# Patient Record
Sex: Female | Born: 1990 | Race: White | Hispanic: No
Health system: Western US, Academic
[De-identification: ages and names within clinical notes are randomized; demographics above are authoritative.]

## PROBLEM LIST (undated history)

## (undated) DIAGNOSIS — R87619 Unspecified abnormal cytological findings in specimens from cervix uteri: Secondary | ICD-10-CM

## (undated) DIAGNOSIS — N939 Abnormal uterine and vaginal bleeding, unspecified: Secondary | ICD-10-CM

## (undated) DIAGNOSIS — Z789 Other specified health status: Secondary | ICD-10-CM

## (undated) DIAGNOSIS — K219 Gastro-esophageal reflux disease without esophagitis: Secondary | ICD-10-CM

## (undated) DIAGNOSIS — E669 Obesity, unspecified: Secondary | ICD-10-CM

## (undated) DIAGNOSIS — R12 Heartburn: Secondary | ICD-10-CM

## (undated) DIAGNOSIS — K259 Gastric ulcer, unspecified as acute or chronic, without hemorrhage or perforation: Secondary | ICD-10-CM

## (undated) DIAGNOSIS — F32A Depression, unspecified: Secondary | ICD-10-CM

## (undated) DIAGNOSIS — R011 Cardiac murmur, unspecified: Secondary | ICD-10-CM

## (undated) DIAGNOSIS — R079 Chest pain, unspecified: Secondary | ICD-10-CM

## (undated) DIAGNOSIS — K30 Functional dyspepsia: Secondary | ICD-10-CM

## (undated) DIAGNOSIS — R112 Nausea with vomiting, unspecified: Secondary | ICD-10-CM

## (undated) DIAGNOSIS — Z8719 Personal history of other diseases of the digestive system: Secondary | ICD-10-CM

## (undated) DIAGNOSIS — F419 Anxiety disorder, unspecified: Secondary | ICD-10-CM

## (undated) DIAGNOSIS — R519 Headache, unspecified: Secondary | ICD-10-CM

## (undated) HISTORY — DX: Gastro-esophageal reflux disease without esophagitis: K21.9

## (undated) HISTORY — DX: Headache, unspecified: R51.9

## (undated) HISTORY — PX: ESOPHAGOGASTRODUODENOSCOPY: SHX5003

## (undated) HISTORY — DX: Unspecified abnormal cytological findings in specimens from cervix uteri: R87.619

## (undated) HISTORY — DX: Depression, unspecified: F32.A

## (undated) HISTORY — DX: Abnormal uterine and vaginal bleeding, unspecified: N93.9

## (undated) HISTORY — PX: PR COLONOSCOPY STOMA DX INCLUDING COLLJ SPEC SPX: 44388

## (undated) HISTORY — DX: Functional dyspepsia: K30

## (undated) HISTORY — DX: Anxiety disorder, unspecified: F41.9

## (undated) HISTORY — DX: Personal history of other diseases of the digestive system: Z87.19

## (undated) HISTORY — PX: CARPAL TUNNEL RELEASE: SHX5017

## (undated) HISTORY — DX: Cardiac murmur, unspecified: R01.1

## (undated) HISTORY — DX: Heartburn: R12

## (undated) HISTORY — DX: Other specified health status: Z78.9

## (undated) HISTORY — DX: Chest pain, unspecified: R07.9

## (undated) HISTORY — DX: Nausea with vomiting, unspecified: R11.2

## (undated) HISTORY — DX: Gastric ulcer, unspecified as acute or chronic, without hemorrhage or perforation: K25.9

## (undated) HISTORY — DX: Obesity, unspecified: E66.9

## (undated) DEATH — deceased

---
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
## 2013-02-05 ENCOUNTER — Telehealth (HOSPITAL_BASED_OUTPATIENT_CLINIC_OR_DEPARTMENT_OTHER): Payer: Self-pay

## 2013-02-05 NOTE — Telephone Encounter (Signed)
CONFIRMED PHONE NUMBER: [phone]    CALLERS FIRST AND LAST NAME: [name],  FACILITY NAME: N/A TITLE: N/A  CALLERS RELATIONSHIP:Self  RETURN CALL: OK to leave detailed message with anyone that answers     SUBJECT: General Message   REASON FOR REQUEST: Patient has urgent referral- called to schedule.  Please contact patient for appointment    MESSAGE: Please assist

## 2013-02-08 NOTE — Telephone Encounter (Signed)
Left a message for pt to call my direct line at [phone] to schedule her appt.

## 2013-02-09 ENCOUNTER — Telehealth (HOSPITAL_BASED_OUTPATIENT_CLINIC_OR_DEPARTMENT_OTHER): Payer: Self-pay | Admitting: Critical Care Medicine

## 2013-02-09 NOTE — Telephone Encounter (Signed)
CONFIRMED PHONE NUMBER: [phone]  CALLERS FIRST AND LAST NAME: Kristen Salinas    FACILITY NAME: NA TITLE: NA  CALLERS RELATIONSHIP:Self  RETURN CALL: Detailed message on voicemail only     SUBJECT: Appointment Request   REASON FOR REQUEST: Patient has a referral and was called 02/08/2013 to be scheduled and missed the call. Was advised to call back before 5pm but she was too late. No scheduling instructions are in the referral for SHORTNESS OF BREATH X 2 DAYS, AMBULATORY O2 DECREASED TO 90%, CT  PULMONARY AGIOGRAM SHOWS GROUND GLASS CHANGES.    REQUEST APPOINTMENT WITH: Unknown per referral  REFERRING PROVIDER: Braulio Bosch MERRELL    REQUESTED DATE: As soon as possible   REQUESTED TIME: any  UNABLE TO APPOINT: Other: No scheduling notes

## 2013-02-12 NOTE — Telephone Encounter (Signed)
Patient calling back. Has not heard back from anyone since missed call. 7/11. Patient currently experiencing shortness of breath again, ongoing for the past 3 days. Patient declined consulting nurse and 911. Please call patient to appoint, no scheduling instructions in referral   [phone], ok to leave a detailed message.  Thank you.

## 2013-02-19 ENCOUNTER — Ambulatory Visit (HOSPITAL_BASED_OUTPATIENT_CLINIC_OR_DEPARTMENT_OTHER): Payer: No Typology Code available for payment source

## 2013-02-19 ENCOUNTER — Encounter (HOSPITAL_BASED_OUTPATIENT_CLINIC_OR_DEPARTMENT_OTHER): Payer: Self-pay

## 2013-02-19 VITALS — BP 123/61 | HR 57 | Temp 97.2°F | Resp 17 | Ht 63.5 in | Wt 219.6 lb

## 2013-02-19 DIAGNOSIS — F4310 Post-traumatic stress disorder, unspecified: Secondary | ICD-10-CM | POA: Insufficient documentation

## 2013-02-19 DIAGNOSIS — R0609 Other forms of dyspnea: Secondary | ICD-10-CM | POA: Insufficient documentation

## 2013-02-19 DIAGNOSIS — R0600 Dyspnea, unspecified: Secondary | ICD-10-CM

## 2013-02-19 DIAGNOSIS — K449 Diaphragmatic hernia without obstruction or gangrene: Secondary | ICD-10-CM | POA: Insufficient documentation

## 2013-02-19 DIAGNOSIS — R079 Chest pain, unspecified: Secondary | ICD-10-CM | POA: Insufficient documentation

## 2013-02-19 DIAGNOSIS — F32A Depression, unspecified: Secondary | ICD-10-CM | POA: Insufficient documentation
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
## 2013-02-19 NOTE — Patient Instructions (Signed)
Decrease propranolol to 20 mg daily.

## 2013-03-25 ENCOUNTER — Ambulatory Visit (HOSPITAL_BASED_OUTPATIENT_CLINIC_OR_DEPARTMENT_OTHER): Payer: No Typology Code available for payment source

## 2013-03-26 ENCOUNTER — Encounter (HOSPITAL_BASED_OUTPATIENT_CLINIC_OR_DEPARTMENT_OTHER): Payer: No Typology Code available for payment source

## 2014-09-10 ENCOUNTER — Ambulatory Visit (INDEPENDENT_AMBULATORY_CARE_PROVIDER_SITE_OTHER): Payer: No Typology Code available for payment source | Admitting: Family Medicine

## 2014-09-10 ENCOUNTER — Encounter (INDEPENDENT_AMBULATORY_CARE_PROVIDER_SITE_OTHER): Payer: Self-pay | Admitting: Family Medicine

## 2014-09-10 VITALS — BP 117/78 | HR 89 | Temp 99.5°F | Resp 16 | Ht 63.5 in | Wt 238.0 lb

## 2014-09-10 DIAGNOSIS — Z1329 Encounter for screening for other suspected endocrine disorder: Secondary | ICD-10-CM

## 2014-09-10 DIAGNOSIS — Z308 Encounter for other contraceptive management: Secondary | ICD-10-CM

## 2014-09-10 DIAGNOSIS — Z3042 Encounter for surveillance of injectable contraceptive: Secondary | ICD-10-CM | POA: Insufficient documentation

## 2014-09-10 DIAGNOSIS — R87619 Unspecified abnormal cytological findings in specimens from cervix uteri: Secondary | ICD-10-CM

## 2014-09-10 DIAGNOSIS — F5101 Primary insomnia: Secondary | ICD-10-CM

## 2014-09-10 DIAGNOSIS — Z131 Encounter for screening for diabetes mellitus: Secondary | ICD-10-CM

## 2014-09-10 DIAGNOSIS — N761 Subacute and chronic vaginitis: Secondary | ICD-10-CM | POA: Insufficient documentation

## 2014-09-10 DIAGNOSIS — Z13220 Encounter for screening for lipoid disorders: Secondary | ICD-10-CM

## 2014-09-10 DIAGNOSIS — Z0000 Encounter for general adult medical examination without abnormal findings: Secondary | ICD-10-CM

## 2014-09-10 LAB — LIPID PANEL
Cholesterol (LDL): 100 mg/dL (ref <130)
Cholesterol/HDL Ratio: 3.1
HDL Cholesterol: 51 mg/dL (ref >39)
Non-HDL Cholesterol: 108 mg/dL (ref 0–159)
Total Cholesterol: 159 mg/dL (ref <200)
Triglyceride: 42 mg/dL (ref <150)

## 2014-09-10 LAB — GLUCOSE SERUM, NONFASTING: Glucose: 89 mg/dL (ref 62–125)

## 2014-09-10 LAB — THYROID STIMULATING HORMONE: Thyroid Stimulating Hormone: 2.295 u[IU]/mL (ref 0.400–5.000)

## 2014-09-10 MED ORDER — ZOLPIDEM TARTRATE 5 MG OR TABS
5.0000 mg | ORAL_TABLET | Freq: Every evening | ORAL | Status: DC | PRN
Start: 2014-09-10 — End: 2014-09-11

## 2014-09-10 NOTE — Progress Notes (Signed)
Reason for visit: Physical    Reviewed eCare status with Patient:  NO    HEALTH MAINTENANCE:  Has the patient had any of these since their last visit?    Cervical screening/PAP: [facility] on 08/27/2014 (Care Everywhere)     Mammo: N/A    Colon Screen: N/A    Have you seen a specialist since your last visit: No        HM Due:   Health Maintenance   Topic Date Due   . HPV 9-26 YEARS (1 of 3 - Female/Unknown 3 Dose Series) 08/19/2003   . TETANUS BOOSTER  08/18/2004   . HIV SCREENING  08/19/2007   . CHLAMYDIA SCREEN  08/18/2010   . PAP SMEAR 21-64 YEARS  08/18/2013   . INFLUENZA VACCINE (1) 04/01/2014           Future Appointments  Date Time Provider Department Center   09/10/2014 12:00 PM Diddee, [facility] JamesSeema, [facility]

## 2014-09-10 NOTE — Patient Instructions (Signed)
It was a pleasure to see you in clinic today.                  If you are not yet signed up for eCare, please speak with a team member at the front desk who would happy to assist you with this process.      eCare  enrollment will allow you access to the below benefits   You can make appointments online   View test results / Lab Results   Obtain a copy of our After Visit Summary (a summary of your visit today)     Your Test Results:  If labs were ordered today the results are expected to be available via eCare in about 5 days. If you have an active eCare account, this is how we will notify you of your results.     If you do not have an eCare account then your test results will be mailed to you within about 14 days after your tests are completed. If your physician needs to change your care based on your results or is concerned, you will receive a phone call.     If you have any questions about your test results please schedule an appointment with your provider.    **If it has been more than 2 weeks and you have not received your test results please send our office a message via eCare.    Medication Refills: If you need a prescription refilled, please contact your pharmacy 1 week before your current supply will run out to request the refill.  Contacting your pharmacy is the fastest and safest way to obtain a medication refill.  The pharmacy will notify our office.  Please note, that a minimum of 48 to 72 hours is needed to refill a medication, but refills are usually processed and sent to your pharmacy in about 5 business days.  Please call your pharmacy early to allow enough time to refill before you anticipate running out.  For faster medication refills, you can also schedule an appointment with your provider.    We know you have a choice in where you receive your healthcare and we sincerely thank you for trusting [facility] with your health.

## 2014-09-11 ENCOUNTER — Ambulatory Visit (INDEPENDENT_AMBULATORY_CARE_PROVIDER_SITE_OTHER): Payer: No Typology Code available for payment source | Admitting: Family Medicine

## 2014-09-11 ENCOUNTER — Telehealth (INDEPENDENT_AMBULATORY_CARE_PROVIDER_SITE_OTHER): Payer: Self-pay | Admitting: Family Medicine

## 2014-09-11 VITALS — BP 121/75 | HR 77 | Temp 99.6°F | Wt 237.0 lb

## 2014-09-11 DIAGNOSIS — G4709 Other insomnia: Secondary | ICD-10-CM

## 2014-09-11 DIAGNOSIS — N760 Acute vaginitis: Secondary | ICD-10-CM

## 2014-09-11 MED ORDER — ZOLPIDEM TARTRATE ER 12.5 MG OR TBCR
12.5000 mg | EXTENDED_RELEASE_TABLET | Freq: Every evening | ORAL | Status: DC | PRN
Start: 2014-09-11 — End: 2014-09-15

## 2014-09-11 MED ORDER — FLUCONAZOLE 150 MG OR TABS
150.0000 mg | ORAL_TABLET | Freq: Once | ORAL | Status: AC
Start: 2014-09-11 — End: 2014-09-11

## 2014-09-11 NOTE — Telephone Encounter (Signed)
Dr. Irine Sealiddee,     Please see below, Pt is having trouble staying asleep with the Ambien and was wondering if you could change it to the Ambien CR.     Please advise. Thank you.

## 2014-09-11 NOTE — Telephone Encounter (Signed)
Faxed in Ambien CR   has she tried BEnadryl or Unisom?

## 2014-09-11 NOTE — Telephone Encounter (Signed)
Please let patient know that insurance will likely not cover this.    I will print this out when I am in clinic on Saturday

## 2014-09-11 NOTE — Telephone Encounter (Signed)
Called and notified Pt per below.     She would like to know if you are willing to send in the Ambien CR to see what her insurance says, if it will be covered.     If the insurance does not cover that and the Ambien, she would like to know her other options would be instead of the regular Ambien.     She asks that I send the message to Dr. Irine Sealiddee and the Covering Provider. She adds that she did not sleep at all last night and is frustrated that Dr. Irine Sealiddee did not have further advice.

## 2014-09-11 NOTE — Progress Notes (Signed)
Subjective:  Kristen Salinas is a 24 year old Female here with chief complaint of 1)had Nexplanon but had prolonged bleeding; also had cramps 1 week before bleeding; had removed on 08/27/14; now has had the Depo Provera; had a pap on 08/29/14 and was given Diflucan but did not take it;had BV show up on pap smear so was given a week of Flagyl; has had yeasty discharge since Tuesday and she took Diflucan    No past medical history on file.  No past surgical history on file.  Patient Active Problem List   Diagnosis   . Depression   . Hiatal hernia   . PTSD (post-traumatic stress disorder)   . Subacute vaginitis   . Encounter for surveillance of injectable contraceptive   . Primary insomnia     Current Outpatient Prescriptions   Medication Sig Dispense Refill   . TRAZODONE HCL OR 200mg  QHS     . Zolpidem Tartrate ER (AMBIEN CR) 12.5 MG Oral Tab CR Take 1 tablet (12.5 mg) by mouth at bedtime as needed for sleep. 30 tablet 0     No current facility-administered medications for this visit.     History     Social History   . Marital Status: Single     Spouse Name: N/A     Number of Children: N/A   . Years of Education: N/A     Occupational History   . Not on file.     Social History Main Topics   . Smoking status: Former Smoker -- 0.50 packs/day for 2 years     Types: Cigarettes   . Smokeless tobacco: Never Used   . Alcohol Use: Yes      Comment: Rare   . Drug Use: No   . Sexual Activity: No     Other Topics Concern   . Not on file     Social History Narrative     Allergies-Prednisone  ROS: Review of Systems:  Constitutional: Negative     Genitourinary: As noted in HPI above   OBJECTIVE:     BP 121/75 mmHg  Pulse 77  Temp(Src) 99.6 F (37.6 C) (Temporal)  Wt 237 lb (107.502 kg)  SpO2 97%  LMP 08/27/2014  General appearance*: Color is normal, Weight appears normal for height.  and No acute distress  Affect*: Judgement/insight normal, Mood/affect normal, Orientation oriented to time, person and place and  Recent/remote Memory normal  Pelvic: Ext. gen.- no lesions;urethral meatus- supravaginal,normal size,no prolapse noted;urethra- no tenderness or scarring noted;vagina-normal rugae with white discharge noted;cervix-long,closed  Wet mount-1+ yeast    IMPRESSION:  (N76.0) Acute vaginitis  (primary encounter diagnosis)  Plan:1) WET PREP/KOH/TEST, ONSITE done  2)  Fluconazole (DIFLUCAN) 150 MG Oral Tab  Given  Return to clinic as needed for followup

## 2014-09-11 NOTE — Telephone Encounter (Signed)
CONFIRMED PHONE NUMBER: [phone]  CALLERS FIRST AND LAST NAME: Janae BridgemanJessica Nicole Mulka  FACILITY NAME: NA TITLE: NA  CALLERS RELATIONSHIP:Self  RETURN CALL: Detailed message on voicemail only     SUBJECT: Medication Management   REASON FOR REQUEST: Medication Management    MEDICATION(S): Ambien   MEDICATION(S)  NEEDED BY: ASAP  ADDITIONAL INFORMATION: Patient was prescribed the Ambien and is having difficulties staying asleep.  She would like to know if Dr. Irine Sealiddee can prescribe the Ambien CR.     Please assist, thank you!

## 2014-09-11 NOTE — Patient Instructions (Addendum)
It was a pleasure to see you in clinic today.                  If you are not yet signed up for eCare, please speak with a team member at the front desk who would happy to assist you with this process.      eCare  enrollment will allow you access to the below benefits   You can make appointments online   View test results / Lab Results   Obtain a copy of our After Visit Summary (a summary of your visit today)     Your Test Results:  If labs were ordered today the results are expected to be available via eCare in about 5 days. If you have an active eCare account, this is how we will notify you of your results.     If you do not have an eCare account then your test results will be mailed to you within about 14 days after your tests are completed. If your physician needs to change your care based on your results or is concerned, you will receive a phone call.     If you have any questions about your test results please schedule an appointment with your provider.    **If it has been more than 2 weeks and you have not received your test results please send our office a message via eCare.    Medication Refills: If you need a prescription refilled, please contact your pharmacy 1 week before your current supply will run out to request the refill.  Contacting your pharmacy is the fastest and safest way to obtain a medication refill.  The pharmacy will notify our office.  Please note, that a minimum of 48 to 72 hours is needed to refill a medication, but refills are usually processed and sent to your pharmacy in about 5 business days.  Please call your pharmacy early to allow enough time to refill before you anticipate running out.  For faster medication refills, you can also schedule an appointment with your provider.    We know you have a choice in where you receive your healthcare and we sincerely thank you for trusting [facility] with your health.    Vaginal Infection: Yeast (Candidiasis)  Yeast  infection occurs when yeast in the vagina increase and start attacking the vaginal tissues. Yeast is a type of fungus. These infections are often caused by a type of yeast called Candida albicans. Other species of yeast can also cause infections. Factors that may make infection more likely include recent antibiotic use, douching, or increasedsex. Yeast infections are more common in women who are diabetic, obese, pregnant, or have a weak immune system.  Symptoms of yeast infection   Clumpy or thin, white discharge, which may look like cottage cheese   No odor or minimal odor   Severe vaginal itching or burning   Burning with urination   Swelling, redness of vulva   Pain during sex  Treating yeast infection  Yeast infection is treated with a vaginal antifungal cream. In some cases, antifungal pills are prescribed instead. During treatment:   Finish all of your medication, even if your symptoms go away.   Apply the cream before going to bed. Lie flat after applying so that it doesn't drip out.   Do not douche or use tampons.   Don't rely on a diaphragm or condoms, since the cream may weaken them.   Avoid intercourse if   advised by your health care provider.       2000-2015 [employer] [address], [place] [address]. All rights reserved. This information is not intended as a substitute for professional medical care. Always follow your healthcare professional's instructions.

## 2014-09-11 NOTE — Progress Notes (Signed)
Reason for visit: recheck vaginal infection   Reviewed eCare status with Patient:  YES    HEALTH MAINTENANCE:  Has the patient had any of these since their last visit?    Cervical screening/PAP: N/A     Mammo: N/A    Colon Screen: N/A    Have you seen a specialist since your last visit: No        HM Due: no hm due  Health Maintenance   Topic Date Due   . HIV SCREENING  08/19/2007   . CHLAMYDIA SCREEN  08/18/2010   . PAP 1 YEAR W/IB MESSAGE  08/28/2015   . TETANUS BOOSTER  12/26/2022   . INFLUENZA VACCINE  Completed   . HPV 9-26 YEARS  Completed           Future Appointments  Date Time Provider Department Center   09/11/2014 7:00 PM Read-Williams, Colvin CaroliPatricia Gale, [facility]   10/13/2014 11:00 AM Mandell, Remo Lippsandy Brian, MD [facility]

## 2014-09-12 ENCOUNTER — Encounter (INDEPENDENT_AMBULATORY_CARE_PROVIDER_SITE_OTHER): Payer: Self-pay | Admitting: Family Medicine

## 2014-09-12 ENCOUNTER — Telehealth (INDEPENDENT_AMBULATORY_CARE_PROVIDER_SITE_OTHER): Payer: Self-pay | Admitting: Internal Medicine

## 2014-09-12 ENCOUNTER — Telehealth (INDEPENDENT_AMBULATORY_CARE_PROVIDER_SITE_OTHER): Payer: Self-pay | Admitting: Family Medicine

## 2014-09-12 DIAGNOSIS — G4700 Insomnia, unspecified: Secondary | ICD-10-CM

## 2014-09-12 LAB — PR WET PREP/KOH/TEST, ONSITE
Clue Cells: NEGATIVE
Odor: ABSENT
RBC, Wet Prep: NEGATIVE
Trich: NEGATIVE

## 2014-09-12 MED ORDER — ESZOPICLONE 1 MG OR TABS
1.0000 mg | ORAL_TABLET | Freq: Every evening | ORAL | Status: DC | PRN
Start: 2014-09-12 — End: 2014-09-15

## 2014-09-12 NOTE — Telephone Encounter (Signed)
Routing to provider. Please see the other TE regarding her request for Lunesta.

## 2014-09-12 NOTE — Telephone Encounter (Signed)
Will defer to Dr. Irine Sealiddee about Kristen Salinas, which is a controlled substance.

## 2014-09-12 NOTE — Telephone Encounter (Signed)
Patient walked in to the clinic and asked for Lunesta.  1 tablet is given to patient.  She is told to wait for Dr. Irine Sealiddee for medication management tomorrow. Please see order.

## 2014-09-12 NOTE — Telephone Encounter (Signed)
Multiple encounters addressing same request and patient complaint. Please contact patient as soon as possible to advise, she is very frustrated. Thank you!

## 2014-09-12 NOTE — Telephone Encounter (Signed)
Patient states that pharmacy dispensed Ambien and patient states that insurance does not cover Ambien.  She [place] [facility] [address].  She can be reached at [phone].  Thank you.

## 2014-09-12 NOTE — Telephone Encounter (Signed)
Patient would like to try Lunesta since her insurance will cover this. Patient's insurance will not cover her Ambien. [facility]? Please also see TE from 09/11/2014.

## 2014-09-12 NOTE — Telephone Encounter (Signed)
This is being handled via the other TE. Closing TE

## 2014-09-12 NOTE — Telephone Encounter (Signed)
Patient was advised earlier that her PCP is not at the clinic today.     Routing again to covering provider to see if they can send in [place].

## 2014-09-12 NOTE — Telephone Encounter (Signed)
Patient calling to check on the status of her prescription refill.  Please contact. Thank you.

## 2014-09-12 NOTE — Telephone Encounter (Signed)
Patient came to the clinic and insist to have Zamb[place]unesta script. 1 tab is given. Needs follow up with Dr. Irine Sealiddee for medication management.

## 2014-09-12 NOTE — Telephone Encounter (Signed)
This request has already been routed to provider and covering provider.

## 2014-09-12 NOTE — Telephone Encounter (Signed)
CONFIRMED PHONE NUMBER: [phone]  CALLERS FIRST AND LAST NAME: Janae BridgemanJessica Nicole Camper  FACILITY NAME: na TITLE: na  CALLERS RELATIONSHIP:Self  RETURN CALL: General message OK     SUBJECT: General Message   REASON FOR REQUEST: Patient is calling with a list of sleeping medication approved by [facility]. Patient also stated she would like to hear back today, she has not slept for 48 hours. Please Advise. Thank you      List:   Restila  Halcion  Hezzie BumpUnison  Lynesta - patient prefers   Sonata        MESSAGE: see above.

## 2014-09-13 ENCOUNTER — Encounter (INDEPENDENT_AMBULATORY_CARE_PROVIDER_SITE_OTHER): Payer: No Typology Code available for payment source | Admitting: Physician Assistant

## 2014-09-13 ENCOUNTER — Telehealth (INDEPENDENT_AMBULATORY_CARE_PROVIDER_SITE_OTHER): Payer: Self-pay | Admitting: Family Medicine

## 2014-09-13 ENCOUNTER — Encounter (INDEPENDENT_AMBULATORY_CARE_PROVIDER_SITE_OTHER): Payer: Self-pay | Admitting: Family Medicine

## 2014-09-13 ENCOUNTER — Ambulatory Visit: Payer: Self-pay

## 2014-09-13 NOTE — Telephone Encounter (Signed)
Protocol: NO CONTACT OR DUPLICATE CONTACT CALL-ADULT-AH  Affirmative: Message left on identified answering machine.  Disposition of No Contact Calls  suggested.  [facility] asking if we would F/U with a call the CC took in regards to [facility] called pt, no answer, LMTCB if still requiring assistance    Glennie Hawkugustine, Reannah Lynn at 09/13/2014 11:30 AM      Status: Signed        Expand All Collapse All   CONFIRMED PHONE NUMBER: [phone]  CALLERS FIRST AND LAST NAME: Janae BridgemanJessica Nicole Dolph  FACILITY NAME: na TITLE: na  CALLERS RELATIONSHIP:Self  RETURN CALL: Detailed message on voicemail only  SUBJECT: General Message   REASON FOR REQUEST: Medication     MESSAGE: Pt wanted to inform Seema Didde that she took the Eszopiclone (LUNESTA) 1 MG Oral Tab and woke up with restless legs and itchy body. Pt stated that she is going to go back to taking the Trazadone until she see's Seema Didde on next appointment on 09/15/14. CCR offered for [facility] to [facility] in regards to the side effects of medication but pt declined. Thank you!

## 2014-09-13 NOTE — Telephone Encounter (Signed)
CONFIRMED PHONE NUMBER: [phone]  CALLERS FIRST AND LAST NAME: Janae BridgemanJessica Nicole Kruger  FACILITY NAME: na TITLE: na  CALLERS RELATIONSHIP:Self  RETURN CALL: Detailed message on voicemail only     SUBJECT: General Message   REASON FOR REQUEST: Medication     MESSAGE: Pt wanted to inform Seema Didde that she took the Eszopiclone (LUNESTA) 1 MG Oral Tab and woke up with restless legs and itchy body. Pt stated that she is going to go back to taking the Trazadone until she see's Seema Didde on next appointment on 09/15/14. CCR offered for pt to [facility] in regards to the side effects of medication but pt declined. Thank you!

## 2014-09-13 NOTE — Telephone Encounter (Signed)
PLEASE CALL AND SEE HOW PATIENT DID WITH THE LUNESTA BEFORE I CAN FILL MORE

## 2014-09-13 NOTE — Telephone Encounter (Signed)
Requested CCL to call and discuss possible medication reaction with the patient.  Please check status on Monday.

## 2014-09-14 DIAGNOSIS — R87619 Unspecified abnormal cytological findings in specimens from cervix uteri: Secondary | ICD-10-CM | POA: Insufficient documentation

## 2014-09-14 NOTE — Progress Notes (Signed)
Kristen BridgemanJessica Nicole Salinas is a 24 year old female here today for a preventive health visit.   Other problems or concerns today: would like something for insomnia  States that she has always had issues falling and staying asleep  OTC meds like benadryl and melatonin havent helped    She has [place] and would like that  She states that he depression and anxiety are well controlled and she has no concerns now  Had a paps few days back.one 6 months back was abnormal.    Had a nexaplanon taken out in 7 days after placement since is caused a lot of cramping  Started depot again  Has been on depot since she was 15    GYN HISTORY  Obstetric History     No data available     Patient's last menstrual period was 08/20/2014.   SEXUAL HISTORY  Sexual activity: yes, single partner, contraception - Depo Provera  Age at first intercourse: Not asked today  History of STD: none known  Number of sex partners in the past year: 1    CANCER SCREENING  Family history of colon cancer: NO  Family history of uterine or ovarian cancer: NO  Family history of breast cancer: NO  Prior mammogram: NO  History of abnormal mammogram: NO    LIFESTYLE  Current dietary habits: healthy diet in general  Calcium: vitamin D supplements:  NO  Current exercise habits: yes, engages in regular exercise  Regular seat belt use: YES  Substance use:  reports that she has quit smoking. Her smoking use included Cigarettes. She has a 1 pack-year smoking history. She has never used smokeless tobacco. She reports that she drinks alcohol. She reports that she does not use illicit drugs.  Stress: No  Exposure to hazardous materials: No  Guns in the house: Not asked today    History sections of chart reviewed and updated today: YES    Review Of Systems  Weight: stable  Constitutional: denies fatigue, fever, chills, sweats  Regular dental exams: yes  GI: Denies constipation, diarrhea, or blood in stools; denies nausea, dysphagia, heartburn or other abd.  pain  GU: denies abnormal vaginal bleeding, discharge or unusual pelvic pain, denies dysuria, frequency or gross hematuria  Endocrine: no known diabetes or thyroid disease   Skin: denies rash or other active skin concerns  Psych: Depression symptoms: none.  Other: none    EXAM:  BP 117/78 mmHg  Pulse 89  Temp(Src) 99.5 F (37.5 C) (Temporal)  Resp 16  Ht 5' 3.5" (1.613 m)  Wt 238 lb (107.956 kg)  BMI 41.49 kg/m2  LMP 08/20/2014  Body mass index is 41.49 kg/(m^2).  General appearance: healthy, alert, no distress  Skin: Skin color, texture, turgor normal. No rashes or concerning lesions  Neck: supple. No adenopathy. Thyroid symmetric, normal size, without nodules  Breasts: No obvious deformity or mass to inspection, nipples everted bilaterally, no skin lesion or nipple discharge, no mass palpated, no axillary lymphadenopathy  Lungs: clear to auscultation  Heart: normal rate, regular rhythm and no murmurs, clicks, or gallops  Abdomen: soft, non-tender. BS normal. No masses or organomegaly  Pelvic:  deferred    ASSESSMENT/PLAN:  Health Maintenance   Topic Date Due   . HIV Screen  09/23/1992   . Pap Smear  08/19/2007   . Chlamydia Screen  08/18/2010   . Tetanus Vaccine  12/26/2022   . Influenza Vaccine  Completed   . HPV Vaccine  Completed  Immunizations or studies due: None    (Z30.8) Encounter for other contraceptive management  (primary encounter diagnosis)  Plan: REFERRAL TO OB/GYN,     (R87.619) Abnormal cervical Papanicolaou smear, unspecified abnormal pap finding  Plan:    (Z13.220) Screening, lipid  Plan: LIPID PANEL            (Z13.1) Screening for diabetes mellitus  Plan: GLUCOSE SERUM, NONFASTING            (Z13.29) Screening for thyroid disorder  Plan: THYROID STIMULATING HORMONE          Insomnia  Discussed sideeffects and risks of ambien  Precautions discussed    Preventive counseling: skin cancer prevention/self-exam  immunizations  dental care  diet rich in 3 omega fatty acids and low in  saturated fats  low-fat high-fiber diet  adequate calcium intake  vitamin D supplementation  proper exercise  Follow-up: 1 year  Lab tests and results of any diagnostic studies will be shared with the patient by  eCare

## 2014-09-15 ENCOUNTER — Ambulatory Visit (INDEPENDENT_AMBULATORY_CARE_PROVIDER_SITE_OTHER): Payer: No Typology Code available for payment source | Admitting: Family Medicine

## 2014-09-15 ENCOUNTER — Telehealth (INDEPENDENT_AMBULATORY_CARE_PROVIDER_SITE_OTHER): Payer: Self-pay | Admitting: Family Medicine

## 2014-09-15 VITALS — BP 140/82 | HR 99 | Temp 99.2°F | Resp 16 | Ht 63.5 in | Wt 237.0 lb

## 2014-09-15 DIAGNOSIS — N760 Acute vaginitis: Secondary | ICD-10-CM

## 2014-09-15 DIAGNOSIS — G4700 Insomnia, unspecified: Secondary | ICD-10-CM

## 2014-09-15 MED ORDER — FLUCONAZOLE 150 MG OR TABS
150.0000 mg | ORAL_TABLET | Freq: Once | ORAL | Status: AC
Start: 2014-09-15 — End: 2014-09-15

## 2014-09-15 NOTE — Telephone Encounter (Signed)
Spoke with patient has appointment with Dr. Irine Sealiddee today.     Closing TE.

## 2014-09-15 NOTE — Progress Notes (Signed)
Reason for visit: Medication Management    Reviewed eCare status with Patient:  NO    HEALTH MAINTENANCE:  Has the patient had any of these since their last visit?    Cervical screening/PAP: Completed     Mammo: N/A    Colon Screen: N/A    Have you seen a specialist since your last visit: No        HM Due:   Health Maintenance   Topic Date Due   . HIV Screen  Jun 21, 1991   . Chlamydia Screen  08/18/2010   . Pap Smear  08/30/2015   . Tetanus Vaccine  12/26/2022   . Influenza Vaccine  Completed   . HPV Vaccine  Completed           Future Appointments  Date Time Provider Department Center   10/13/2014 11:00 AM Mandell, Remo Lippsandy Brian, MD [facility]

## 2014-09-15 NOTE — Telephone Encounter (Signed)
Seen in clinic today.

## 2014-09-15 NOTE — Telephone Encounter (Addendum)
Patient does not want to take Lunesta - please see other TE's.   Disregard PA request.

## 2014-09-15 NOTE — Patient Instructions (Signed)
It was a pleasure to see you in clinic today.                  If you are not yet signed up for eCare, please speak with a team member at the front desk who would happy to assist you with this process.      eCare  enrollment will allow you access to the below benefits   You can make appointments online   View test results / Lab Results   Obtain a copy of our After Visit Summary (a summary of your visit today)     Your Test Results:  If labs were ordered today the results are expected to be available via eCare in about 5 days. If you have an active eCare account, this is how we will notify you of your results.     If you do not have an eCare account then your test results will be mailed to you within about 14 days after your tests are completed. If your physician needs to change your care based on your results or is concerned, you will receive a phone call.     If you have any questions about your test results please schedule an appointment with your provider.    **If it has been more than 2 weeks and you have not received your test results please send our office a message via eCare.    Medication Refills: If you need a prescription refilled, please contact your pharmacy 1 week before your current supply will run out to request the refill.  Contacting your pharmacy is the fastest and safest way to obtain a medication refill.  The pharmacy will notify our office.  Please note, that a minimum of 48 to 72 hours is needed to refill a medication, but refills are usually processed and sent to your pharmacy in about 5 business days.  Please call your pharmacy early to allow enough time to refill before you anticipate running out.  For faster medication refills, you can also schedule an appointment with your provider.    We know you have a choice in where you receive your healthcare and we sincerely thank you for trusting [facility] with your health.

## 2014-09-15 NOTE — Telephone Encounter (Signed)
Please see Nurse Triage report  Reason for Call: No Bill, possible reaction to RX  Recommended Disposition: No contact Calls  Action: Patient states that new RX caused possible reaction and will go back to old RX until she meets with Dr Diddee again. Patient has appt with Dr Diddee  On 2/15 for other issues and will discuss RX incident.

## 2014-09-15 NOTE — Telephone Encounter (Signed)
Patient is scheduled for 2/15.   Routing as an FYI to Dr. Irine Sealiddee.

## 2014-09-15 NOTE — Telephone Encounter (Signed)
Seeing dr. Irine Sealiddee today.     Closing TE.

## 2014-09-15 NOTE — Telephone Encounter (Signed)
Saw patient in clinic today.

## 2014-09-16 NOTE — Telephone Encounter (Signed)
Patient was seen 2/15.    Closing TE.

## 2014-09-17 ENCOUNTER — Encounter (INDEPENDENT_AMBULATORY_CARE_PROVIDER_SITE_OTHER): Payer: Self-pay | Admitting: Obstetrics & Gynecology

## 2014-09-17 ENCOUNTER — Ambulatory Visit (INDEPENDENT_AMBULATORY_CARE_PROVIDER_SITE_OTHER): Payer: No Typology Code available for payment source | Admitting: Obstetrics & Gynecology

## 2014-09-17 VITALS — BP 119/83 | HR 85 | Temp 99.0°F | Resp 17 | Wt 237.0 lb

## 2014-09-17 DIAGNOSIS — Z975 Presence of (intrauterine) contraceptive device: Secondary | ICD-10-CM

## 2014-09-17 DIAGNOSIS — L298 Other pruritus: Secondary | ICD-10-CM

## 2014-09-17 DIAGNOSIS — B379 Candidiasis, unspecified: Secondary | ICD-10-CM

## 2014-09-17 DIAGNOSIS — N898 Other specified noninflammatory disorders of vagina: Secondary | ICD-10-CM

## 2014-09-17 LAB — PR WET PREP/KOH/TEST, ONSITE
Clue Cells: NEGATIVE
Odor: ABSENT
RBC, Wet Prep: NEGATIVE
Trich: NEGATIVE
WBC: NEGATIVE

## 2014-09-17 MED ORDER — FLUCONAZOLE 150 MG OR TABS
150.0000 mg | ORAL_TABLET | Freq: Once | ORAL | Status: AC
Start: 2014-09-17 — End: 2014-09-17

## 2014-09-17 MED ORDER — LORAZEPAM 0.5 MG OR TABS
0.5000 mg | ORAL_TABLET | Freq: Once | ORAL | Status: AC
Start: 2014-09-17 — End: 2014-09-17
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
## 2014-09-17 NOTE — Progress Notes (Signed)
ROS:  Extended 2-9, Complete 10+  Constitutional: Negative   Eyes: Negative   Ears, Nose, Mouth, Throat: Negative   Cardiovascular: Negative   Respiratory: Negative   Gastrointestinal: Negative  Genitourinary: Negative  Musculoskeletal: Negative   Skin: Negative   Neurological: Negative   Psychiatric: Negative   Endocrine: Negative   Hematologic/Lymphatic: Negative  Allergic/Immunologic: Negative

## 2014-09-17 NOTE — Patient Instructions (Signed)
It was a pleasure to see you in clinic today.     You can schedule an appointment to see us by calling [phone] or via eCare. If you are not yet signed up for eCare, please speak with a team member at the front desk.    eCare enrollment will allow you to make appointments online, view test results and obtain a copy of our After Visit Summary.     If labs were ordered today the results are expected to be available via eCare 5 days later. If viewing your results on eCare, please make sure to scroll all the way to the bottom of the results to view any messages written by your provider. If you have an active eCare account, this is where we will submit your results. If you wish to receive the results over the phone or via letter, please call our office directly at [phone] and leave a message for your provider.     Otherwise, result letters are mailed 7-14 days after your tests are completed. If your physician needs to change your care based on your results, you will receive a phone call to notify you. If you haven't heard from him/her and it has been more than 14 days please give us a call.     Medication refills: If you need to get a prescription refill please contact your pharmacy 1 week before your current supply will run out to request the refill. Contacting your pharmacy is the fastest and easiest way to obtain a medication refill. The pharmacy will notify our office. Please note that a minimum of 48-72 hours is needed to refill a medication. Please call your pharmacy early to allow enough time to refill before you anticipate running out.     We know that you have a choice in where you receive your healthcare and we sincerely thank you for choosing [facility].      Thank you,   Acey Woodfield Del Rosario  Medical Assistant

## 2014-09-18 ENCOUNTER — Telehealth (INDEPENDENT_AMBULATORY_CARE_PROVIDER_SITE_OTHER): Payer: Self-pay | Admitting: Obstetrics & Gynecology

## 2014-09-18 LAB — GC&CHLAM NUCLEIC ACID DETECTN
Chlam Trachomatis Nucleic Acid: NEGATIVE
N.Gonorrhoeae(GC) Nucleic Acid: NEGATIVE

## 2014-09-18 NOTE — Telephone Encounter (Signed)
Need to defer to provider - unable to send more info until chart notes are completed.  [facility] staff please submit PA documentation once completed by provider.

## 2014-09-18 NOTE — Telephone Encounter (Signed)
[place] from [place] prior [place] stating    "FLUCONAZOLE (diflucan) formulary up to #3 tablets per 25 days. Please provider documentation regarding this members condition and necessity for exceeding formulary quantity. Thank you. Please fax related chart notes/documentation with fax cover sheet written at the top/titled PA#: [id number] to FAX#: [phone]. Additional questions, please call [phone]."    Plan Participant ID: [id number]      Routing to Dr. Laurell JosephsBurke and OB/GYN Nurse

## 2014-09-19 ENCOUNTER — Encounter (INDEPENDENT_AMBULATORY_CARE_PROVIDER_SITE_OTHER): Payer: Self-pay | Admitting: Obstetrics & Gynecology

## 2014-09-21 NOTE — Progress Notes (Signed)
SUBJECTIVE:   24 year old female complains of vaginal itching with white discharge for 3 days.    Denies abnormal vaginal bleeding or significant pelvic pain or  fever. No UTI symptoms. Does not have history of known exposure to STD.  Does not have dyspareunia.    Patient's last menstrual period was 08/27/2014.     Also requests trazodone for insomnia  She has taken this in past and it helps  She states that the lunesta made her restless    OBJECTIVE:   General: well nourished, well developed, no distress  Vitals as noted above  Pelvic Exam: EGBUS within normal limits, white vaginal discharge-cheese and it help and normal cervix without lesions, polyps or tenderness.    ASSESSMENT:   yeast vaginitis    PLAN:   Per orders.  ROV prn if symptoms persist or worsen.      (G47.00) Insomnia, unspecified type  Plan: trazodone refilled  sideeffects and risks discussed

## 2014-09-24 LAB — R/O YEAST CULT W/DIRECT EXAM: Stain For Fungus: NONE SEEN

## 2014-09-24 NOTE — Telephone Encounter (Signed)
Called patient to review negative yeast culture results. Patient states itching and burning has resolved.  We discussed that with the negative culture, i do not recommend the suppressive therapy with weekly fluconazole (and will close the prior-authorization request). Patient agrees. She will follow-up as scheduled on 10/08/14 for IUD placement.

## 2014-09-25 ENCOUNTER — Telehealth (INDEPENDENT_AMBULATORY_CARE_PROVIDER_SITE_OTHER): Payer: Self-pay | Admitting: Obstetrics & Gynecology

## 2014-09-25 DIAGNOSIS — N898 Other specified noninflammatory disorders of vagina: Secondary | ICD-10-CM

## 2014-09-25 NOTE — Telephone Encounter (Signed)
CONFIRMED PHONE NUMBER: [phone]  CALLERS FIRST AND LAST NAME: Kristen BridgemanJessica Nicole Salinas  FACILITY NAME: na TITLE: na  CALLERS RELATIONSHIP:Self  RETURN CALL: Detailed message on voicemail only     SUBJECT: General Message   REASON FOR REQUEST: Questions, IUD    MESSAGE: Patient is calling wanting to ask some questions about the IUD, not the procedure itself. Please call her back, thank you.

## 2014-09-25 NOTE — Telephone Encounter (Signed)
Dr. Laurell JosephsBurke pt has questions about the IUD....    1) pt is about 233lbs and want to make sure that her BMI wouldn't effect the effeteness of the IUD  2) She read on line that the IUD doesn't prevent olivation so is there enough in the IUD to prevent from getting preg?     3) if the Murina doesn't fit and the pt said there skyla isn't a option. She will stay on Depo.     Please advise pt appt is on 3/9 with you.

## 2014-09-29 NOTE — Telephone Encounter (Signed)
Returned patient's call about questions about the IUD.   Spent 13 minutes on the phone answering the patient's questions.     Also adds that her yeast symptoms are resolved, but now she's noticing a vaginal odor. She's worried she has BV again.     Asked her to go by clinic and collect and leave a vaginal self swab for KOH/Wet prep. Will treat as indicated.

## 2014-09-30 ENCOUNTER — Encounter (INDEPENDENT_AMBULATORY_CARE_PROVIDER_SITE_OTHER): Payer: Self-pay | Admitting: Family

## 2014-09-30 ENCOUNTER — Other Ambulatory Visit (INDEPENDENT_AMBULATORY_CARE_PROVIDER_SITE_OTHER): Payer: No Typology Code available for payment source

## 2014-09-30 ENCOUNTER — Ambulatory Visit (INDEPENDENT_AMBULATORY_CARE_PROVIDER_SITE_OTHER): Payer: No Typology Code available for payment source | Admitting: Family

## 2014-09-30 VITALS — BP 131/80 | HR 75 | Temp 98.9°F | Wt 235.6 lb

## 2014-09-30 DIAGNOSIS — N898 Other specified noninflammatory disorders of vagina: Secondary | ICD-10-CM

## 2014-09-30 LAB — PR WET PREP/KOH/TEST, ONSITE
Bacteria: NEGATIVE
Clue Cells: NEGATIVE
Odor: ABSENT
Trich: NEGATIVE
WBC: NEGATIVE
Yeast, Wet Prep: NEGATIVE

## 2014-09-30 NOTE — Progress Notes (Signed)
Reason for visit: wet [place]   Reviewed eCare status with Patient:  NO    HEALTH MAINTENANCE:  Has the patient had any of these since their last visit?    Cervical screening/PAP: N/A     Mammo: N/A    Colon Screen: N/A    Have you seen a specialist since your last visit: No        HM Due:   Health Maintenance   Topic Date Due   . HIV Screen  October 26, 1992   . Pap Smear  08/30/2015   . Chlamydia Screen  09/18/2015   . Tetanus Vaccine  12/26/2022   . Influenza Vaccine  Completed   . HPV Vaccine  Completed           Future Appointments  Date Time Provider Department Center   10/08/2014 1:00 PM Eppie GibsonBurke, [facility] K[facility]   10/13/2014 11:00 AM Mandell, Remo Lippsandy Brian, MD [facility]

## 2014-09-30 NOTE — Telephone Encounter (Signed)
Kristen Salinas, please see patients eCare message to assist, thank you.  Patient has questions regarding test results.

## 2014-09-30 NOTE — Patient Instructions (Addendum)
It was a pleasure to see you in clinic today.               Increasing Metabolism & Possible Reduce Inflammation  Tips and Tricks  1. Eat small frequent Meals throughout the day.  Extending the time between meals makes your body go into 'starvation mode' which decreases your metabolism as a means to conserve energy and prevent starvation.  While some people are able to lose weight through intermittent fasting, most people generally eat less overall when they eat small, frequent meals.  In addition to having four to six small meals per day eating healthy snacks will also increase metabolism.  2. Choose lean proteins. Eating a diet rich in lean proteins will increase your metabolism because it takes more energy for your body to digest the protein.  Examples of lean protein are turkey, fish, eggs, beans and tofu.  3. Add spice to your favorite foods.  Spicy peppers, cayenne pepper, turmeric can increase metabolism by as much as 8% when eaten with the RIGHT foods.  4. Get at least 30 minutes of aerobic exercise daily.  Breaking it up into 5 or 10 minute intervals also works. Always look for ways to get a little more exercise into your daily activities.  5. Add strength training to your exercise program.  Muscle burns more calories than fat. Having more muscle is truly the only way to increase your resting metabolic rate which accounts for 60 to 70 percent of the calories your burn daily.   6. Drink plenty of water.  You can increase your metabolic rate by as much as 40% by simply drinking enough water.  Most people need 48-64 oz per day.  7. Drink coffee and / or green tea.  Recent research indicates green tea extract, even decaffeinated, can increase metabolism significantly.   8. Be aware that crash diets can ultimately slow down your metabolism.  9. Calculate how many calories you need to support your current and ideal weight using the Mifflin-St. Jeor equation. There are calculators on line available to do  this.  10. GOOD FATS matter.  Fill your diet with unsaturated vegetable oils (e.g. sunflower oil), unsalted nuts, and fish.  You will reduce your craving for simple sugars and carbs such as candy, ice cream, pasta and white flour products)!   11. HORMONES matter.  As we age, white fat cells (these are the pesky ones) expand especially around the midsection and the drop in hormones activates certain proteins in the body that promote fat storage there.  This is also true of cellulite.   12.  While there is no one solution to this problem, we do know that visceral fat does respond to aerobic exercise better than strength training, especially if interval training methods are incorporated into the program.  13. Sleep! Yes- to increase your metabolism you need to sleep WELL at least 7 hours nightly. If you are having difficulty with sleep, address this with your provider as you may need a comprehensive evaluation to determine the underlying cause.  It is very important to review your approach to sleep as well.   14. Obviously smoking, excessive alcohol or cannabis will reduce your metabolism.  15. DHEA? There is a lot of buzz.  It is used to slow or reverse aging, improve thinking skills, increasing muscle mass, strength and energy.  25-50 mg daily is recommended given as a single dose for most adults.  Talk to your doctor before starting this or   review side effects and interactions at the WEBMD online Vitamin-Supplement web site.  16. Chronic inflammation can reduce your motivation to exercise. Pain is pain.  There are no magic diets but following the Mediterranean diet has been shown to be helpful.  The key components are to eat generous amounts of fruits and vegetables, consume healthy fats, eat small portions of unsalted nuts, drink small portions of red wine, eat fish on a regular basis and consume very little red meat.  a. There are only a few dietary supplements with research indicating that may be of interest.   TALK TO YOUR DOCTOR BEFORE STARTING ANY OF THESE SUPPLEMENTS: Dosages have not yet been established.  Research is underway.  Consider the following:  i. Cat's Claw-may ease joint pain  ii. Devil's Claw- Used extensively in [place]  iii. Mangosteen-Anti-allergy, antifungal as well as anti-inflammatory  iv. Milk Thistle- protects the liver.   v. Alpha lipoic acid -Used for reducing burning pain in feet, memory loss, numbness, chronic fatigue, memory loss and some eye diseases.   Doses range from 300 to 1200 mg per day.  vi. Tumeric - used for arthritis,heartburn, diarrhea, stomach bloating, loss of appetite, liver and gallbladder disorders. May also help memory , inflammation, fibromyalgia and other inflammatory conditions including headaches. For arthritic pain that may interfere with exercise, 500 mg of turmeric twice daily (Meriva or Indena) twice daily.    vii. Glucosamine If you are going to take it for arthritic pain, use glucosamine sulfate. Dosage studied is limited to 500 mg three times per day.  viii.          If you are not yet signed up for eCare, please speak with a team member at the front desk who would happy to assist you with this process.      eCare  enrollment will allow you access to the below benefits   You can make appointments online   View test results / Lab Results   Obtain a copy of our After Visit Summary (a summary of your visit today)     Your Test Results:  If labs were ordered today the results are expected to be available via eCare in about 5 days. If you have an active eCare account, this is how we will notify you of your results.     If you do not have an eCare account then your test results will be mailed to you within about 14 days after your tests are completed. If your physician needs to change your care based on your results or is concerned, you will receive a phone call.     If you have any questions about your test results please schedule an appointment with your  provider.    **If it has been more than 2 weeks and you have not received your test results please send our office a message via eCare.    Medication Refills: If you need a prescription refilled, please contact your pharmacy 1 week before your current supply will run out to request the refill.  Contacting your pharmacy is the fastest and safest way to obtain a medication refill.  The pharmacy will notify our office.  Please note, that a minimum of 48 to 72 hours is needed to refill a medication, but refills are usually processed and sent to your pharmacy in about 5 business days.  Please call your pharmacy early to allow enough time to refill before you anticipate running out.  For faster medication refills,   you can also schedule an appointment with your provider.    We know you have a choice in where you receive your healthcare and we sincerely thank you for trusting [facility] with your health.

## 2014-09-30 NOTE — Progress Notes (Signed)
Subjective    Kristen BridgemanJessica Nicole Salinas is  here with chief complaint:    1) Vaginal odor about 1 week ago. She did have some spotting as well. Taking probiotics and fiber. Has been recently tx for yeast infection. Planing to have GYN procedure next week and once to be infection free  HPI of the above condition (1-3):   She is taking depo and has been compliant.        ROS:     Mild abdominal pain x 3 days. She has constipation ongoing.   No dysuria.   Patient Active Problem List   Diagnosis   . Depression   . Hiatal hernia   . PTSD (post-traumatic stress disorder)   . Subacute vaginitis   . Encounter for surveillance of injectable contraceptive   . Primary insomnia   . Abnormal cervical Papanicolaou smear     Current Outpatient Prescriptions   Medication Sig Dispense Refill   . TraZODone HCl 100 MG Oral Tab Take 1.5 tablets (150 mg) by mouth at bedtime. For Insomnia. 90 tablet 3     No current facility-administered medications for this visit.       Allergies-Prednisone    History sections of chart reviewed and updated today: Yes    Objective  PHYSICAL EXAM:  General: Pleasant, no distress, vitals stable   GYN: Labia majora and minor appear pink, most without any discharge or lesions. No pain in vaginal canal, No uterine or ovarian pain.     Medical Decision Making:    low complexity       ASSESSMENT/PLAN:        1. Vaginal irritation  Recheck is  Negative  She was encouraged to proceed with her plan  Avoid irritants  Continue home care plan  She is using  - WET PREP/KOH/TEST, ONSITE      Patient was provided patient education and follow up instructions on AVS    Health Maintenance reviewed -  Health Maintenance Due   Topic   . HIV Screen

## 2014-10-02 ENCOUNTER — Other Ambulatory Visit (INDEPENDENT_AMBULATORY_CARE_PROVIDER_SITE_OTHER): Payer: No Typology Code available for payment source

## 2014-10-03 ENCOUNTER — Telehealth (INDEPENDENT_AMBULATORY_CARE_PROVIDER_SITE_OTHER): Payer: Self-pay | Admitting: Family Medicine

## 2014-10-03 ENCOUNTER — Ambulatory Visit: Payer: Self-pay

## 2014-10-03 NOTE — Telephone Encounter (Signed)
Chest pain with deep breath  No cold or cough,   Started Monday. Dizziness, exhale makes worse, seen Tuesday.  Has constipation, resolving  Feels like something is on Chest, feels tight

## 2014-10-03 NOTE — Telephone Encounter (Signed)
Please see Nurse Triage report  Reason for Call: Chest Pain  Recommended Disposition: Call EMS 911 now, immediate medical attn is needed  Action: Patient has been scheduled to see Dr Diddee on Monday 10/06/14 @ 1120am after speaking with Nurse.

## 2014-10-03 NOTE — Telephone Encounter (Signed)
Protocol: CHEST PAIN-ADULT-AH  Negative: Severe difficulty breathing (e.g., struggling for each breath, speaks in single words)  Negative: Difficult to awaken or acting confused (e.g., disoriented, slurred speech)  Negative: Shock suspected (e.g., cold/pale/clammy skin, too weak to stand, low BP, rapid pulse)  Negative: [1] Chest pain lasts > 5 minutes AND [2] history of heart disease  (i.e., heart attack, bypass surgery, angina, angioplasty, CHF; not just a heart murmur)  Affirmative: [1] Chest pain lasts > 5 minutes AND [2] described as crushing, pressure-like, or heavy  Disposition of Call EMS 911 Now suggested.

## 2014-10-06 ENCOUNTER — Ambulatory Visit (INDEPENDENT_AMBULATORY_CARE_PROVIDER_SITE_OTHER): Payer: No Typology Code available for payment source | Admitting: Family Medicine

## 2014-10-06 ENCOUNTER — Telehealth (INDEPENDENT_AMBULATORY_CARE_PROVIDER_SITE_OTHER): Payer: Self-pay | Admitting: Family Medicine

## 2014-10-06 VITALS — BP 116/78 | HR 99 | Temp 99.0°F | Resp 18 | Ht 63.5 in | Wt 232.0 lb

## 2014-10-06 DIAGNOSIS — K5900 Constipation, unspecified: Secondary | ICD-10-CM

## 2014-10-06 DIAGNOSIS — R140 Abdominal distension (gaseous): Secondary | ICD-10-CM

## 2014-10-06 LAB — PR URINE PREGNANCY TEST HCG, ONSITE: Pregnancy (HCG) (UWNC), URN: NEGATIVE

## 2014-10-06 NOTE — Telephone Encounter (Signed)
CONFIRMED PHONE NUMBER: [phone]  CALLERS FIRST AND LAST NAME: Kristen BridgemanJessica Nicole Salinas  FACILITY NAME: na TITLE: na  CALLERS RELATIONSHIP:Self  RETURN CALL: General message OK     SUBJECT: General Message   REASON FOR REQUEST: outside records request    MESSAGE: [facility] and is requesting the clinic to request those records from them. [facility] phone #: [phone].

## 2014-10-06 NOTE — Progress Notes (Signed)
Reason for visit: abd pain   Reviewed eCare status with Patient:  NO    HEALTH MAINTENANCE:  Has the patient had any of these since their last visit?    Cervical screening/PAP: Completed     Mammo: N/A    Colon Screen: N/A    Have you seen a specialist since your last visit: No        HM Due:   Health Maintenance   Topic Date Due   . HIV Screen  April 19, 1993   . Pap Smear  08/30/2015   . Chlamydia Screen  09/18/2015   . Tetanus Vaccine  12/26/2022   . Influenza Vaccine  Completed   . HPV Vaccine  Completed           Future Appointments  Date Time Provider Department Center   10/06/2014 11:20 AM Diddee, Deatra JamesSeema, MD [facility]   10/08/2014 1:00 PM Eppie GibsonBurke, Alson K, MD [facility]   10/13/2014 11:00 AM Virgina JockMandell, Remo Lippsandy Brian, MD [facility]

## 2014-10-06 NOTE — Telephone Encounter (Signed)
Called and LM letting Pt know that we [facility], see below attached from Care Everywhere.   Routing as FYI to Dr. Irine Sealiddee, she has an appt at 11:20.  Closing TE.     Progress Notes  Kristen Salinas, Kevin, [place]PA - 10/04/2014 5:07 PM PST  .  Subjective:   HPI:    Chief Complaint   Patient presents with   . Urinary Tract Infection   Has felt constipated since Monday and has been taking mag. Worked but constipated again by the next day. Took senna. Bloated, in pain, pelvis feels "really really heavy".     This patient is a female 24 y.o. with chief complaint of constipation.   She says she took magnesium citrate, and helped clear her out, but she felt she got constipated again.  Took senna but didn't help.  She has had some nausea.   She feels bloated and has some lower abdominal pain.     Historian: patient     PAST MEDICAL HISTORY:.   Past Medical History   Diagnosis Date   . Asthma   . Anxiety   . Depression     History reviewed. No pertinent past surgical history.   MEDS:   Current Outpatient Prescriptions on File Prior to Visit   Medication Sig Dispense Refill   . traZODone (DESYREL) 100 MG tablet Take 200 mg by mouth nightly. 0   . HYDROcodone-acetaminophen (NORCO) 5-325 mg per tablet Take 1-2 tablets by mouth every 6 (six) hours as needed for Pain. Do not exceed 4000 mg of acetaminophen/24 hours.  If 65 or older, do not exceed 3000 mg of acetaminophen/24 hours. 15 tablet 0   . [DISCONTINUED] ETONOGESTREL (NEXPLANON SDRM) by Subdermal route.     No current facility-administered medications on file prior to visit.     ALLERGIES:   Prednisone   SOCIAL HISTORY:.   History   Substance Use Topics   . Smoking status: Former Smoker -- 0.40 packs/day   Types: Cigarettes   . Smokeless tobacco: Not on file   . Alcohol Use: No       Review of Systems:  Constitutional: negative for fever.   HENT: Negative for sore throat.Negative for URI symptoms.  Respiratory: Negative for chest tightness. Negative for cough  and shortness of breath.   Cardiovascular: Negative for chest pain and leg swelling.   Gastrointestinal: Positive for abdominal pain. Negative for abdominal distention positive for nausea, negative for vomiting. negative diarrhea. No hematemesis. No blood in stools.   Genitourinary: Negative for dysuria and frequency.   Neurological: Negative for syncope.   Psychiatric/Behavioral: Negative.     Objective:   Physical Exam:    BP 132/84 mmHg  Pulse 81  Temp(Src) 37.3 C (99.2 F) (Oral)  Resp 14  Ht 1.6 m (5\' 3" )  Wt 106.595 kg (235 lb)  BMI 41.64 kg/m2  SpO2 98%  LMP (LMP Unknown)    Constitutional: She is oriented to person, place, and time. Patient appears well-developed. Patient is well appearing.   HENT:   Head: Normocephalic   Mouth/Throat: Oropharynx is clear and moist. No intraoral lesions.   Eyes: Conjunctivae and EOM are normal. Pupils are equal, round, and reactive to light.   TMS: Normal  Neck: Normal range of motion. Neck supple.  Cardiovascular: Normal rate, regular rhythm and normal heart sounds.   Pulmonary/Chest: Effort normal and breath sounds normal. No respiratory distress. No wheezes.   Abdominal: She exhibits mild distension. There  is tenderness to the lower abdominal area. There is no rebound.   Musculoskeletal: Normal range of motion.   Neurological: She is alert and oriented to person, place, and time.   Skin: Skin is warm.   Psychiatric: She has a normal mood and affect.     Assessment:     MEDICAL DECISION MAKING:Patient comes in for evaluation of abdominal pain  Patient says she feels bloated and that there is stool pressure on her bladder.  She is not sure if she has a bladder infection.   She has diffuse mild tenderness. No evidence of acute abdomen.     Urinalysis will be done.   I will do an upright abdomen to see if she is constipated.     COURSE IN THE DEPARTMENT:  Urinalysis was normal  Abdominal xray interpreted by me as negative for obstruction.   Not excessive amount of  stool  Radiologist read: X-ray Abdomen Ap Upright    10/04/2014 IMPRESSION: 1. No free air or small bowel obstruction. Edited By [id number]     Patient says she still feels full, but has no evidence of acute abdomen.   She will try an enema.  She can also try mylanta.     She will follow up with PCP on Monday if not better.   She saw PCP last week initially.     I feel no other imaging required at this time.     Kristen Salinas was discharged to home in stable condition.    DIAGNOSIS: ABDOMINAL PAIN: HISTORY OF CONSTIPATION    New Prescriptions   No medications on file     LAB RESULTS:   Recent Results (from the past 24 hour(s))   POCT- UA Dip Auto   Collection Time: 10/04/14 4:44 PM   Result Value Ref Range   UA Color Yellow Yellow   UA Clarity Clear Clear   UA Glucose Negative Negative mg/dL   UA Bilirubin Negative Negative   UA Ketones Negative Negative   UA Specific Gravity 1.005 1.005-1.030   UA Blood Negative Negative   UA pH 5.5 5.0-9.0   UA Protein Negative Negative mg/dL   UA Urobilinogen 0.2 E.U/dL   UA Nitrite Negative Negative   UA Leukocyte Esterase Negative Negative   POCT- Pregnancy Test, Urine(AMB)   Collection Time: 10/04/14 4:47 PM   Result Value Ref Range   Urine Pregnancy Test Negative     IMAGING RESULTS:   No orders to display     ED Visit   None     Kristen Eke, PA   5:08 PM, 10/04/2014     Plan:   Follow up with your doctor if not improved in 2-3 days  Return to the UC or ER if increasing pain, fevers, Or more right upper quadrant pain or right lower quadrant pain.  tylenol     Patient able to understand findings, treatment plan and reasons for follow up to the UC/ER and PCP. She has no other questions at this time. Patient advised that She can return any new concerns or call if any questions.

## 2014-10-06 NOTE — Patient Instructions (Signed)
It was a pleasure to see you in clinic today.                  If you are not yet signed up for eCare, please speak with a team member at the front desk who would happy to assist you with this process.      eCare  enrollment will allow you access to the below benefits   You can make appointments online   View test results / Lab Results   Obtain a copy of our After Visit Summary (a summary of your visit today)     Your Test Results:  If labs were ordered today the results are expected to be available via eCare in about 5 days. If you have an active eCare account, this is how we will notify you of your results.     If you do not have an eCare account then your test results will be mailed to you within about 14 days after your tests are completed. If your physician needs to change your care based on your results or is concerned, you will receive a phone call.     If you have any questions about your test results please schedule an appointment with your provider.    **If it has been more than 2 weeks and you have not received your test results please send our office a message via eCare.    Medication Refills: If you need a prescription refilled, please contact your pharmacy 1 week before your current supply will run out to request the refill.  Contacting your pharmacy is the fastest and safest way to obtain a medication refill.  The pharmacy will notify our office.  Please note, that a minimum of 48 to 72 hours is needed to refill a medication, but refills are usually processed and sent to your pharmacy in about 5 business days.  Please call your pharmacy early to allow enough time to refill before you anticipate running out.  For faster medication refills, you can also schedule an appointment with your provider.    We know you have a choice in where you receive your healthcare and we sincerely thank you for trusting [facility] with your health.

## 2014-10-08 ENCOUNTER — Encounter (HOSPITAL_BASED_OUTPATIENT_CLINIC_OR_DEPARTMENT_OTHER): Payer: Self-pay | Admitting: Obstetrics & Gynecology

## 2014-10-08 ENCOUNTER — Encounter (INDEPENDENT_AMBULATORY_CARE_PROVIDER_SITE_OTHER): Payer: Self-pay | Admitting: Obstetrics & Gynecology

## 2014-10-08 ENCOUNTER — Ambulatory Visit (INDEPENDENT_AMBULATORY_CARE_PROVIDER_SITE_OTHER): Payer: No Typology Code available for payment source | Admitting: Obstetrics & Gynecology

## 2014-10-08 VITALS — BP 124/84 | HR 80 | Temp 96.6°F | Resp 14 | Wt 230.0 lb

## 2014-10-08 DIAGNOSIS — Z30014 Encounter for initial prescription of intrauterine contraceptive device: Secondary | ICD-10-CM

## 2014-10-08 DIAGNOSIS — Z30430 Encounter for insertion of intrauterine contraceptive device: Secondary | ICD-10-CM

## 2014-10-08 LAB — PR URINE PREGNANCY TEST HCG, ONSITE: Pregnancy (HCG) (UWNC), URN: NEGATIVE

## 2014-10-08 NOTE — Telephone Encounter (Signed)
Patient was seen 3/7.    Closing TE.

## 2014-10-08 NOTE — Patient Instructions (Signed)
Please call for increasing abdominal pain or cramping or have vaginal bleeding, greater than soaking through more than one pad per hour.   You can check your strings after your next cycle.

## 2014-10-08 NOTE — Progress Notes (Signed)
S:pt is here for  Follow up  of ER visit on 10/04/14  States that for over 3-4 weeks now feeling very bloated,uncomfortable,nauseous,pelvic pressure.  She had some chest discomfort day before she went to ER that resolved  Has been constipated for over 1 month with bms only with mag  Has bm every day-hard small when no laxative taken  Abdominal Pain is 4-8/10 when she is uncomfortable  No benefits with miralax,senna  She has had constipation on and off  She had her nexaplanon removed and is scheduled for an IUD.requests a urine pregnancy test  No fever/chills    No urinary symptoms now but did feel pelvic pressure    At ER she had  A CT KUB and urine test that were normal    She and her husband did switch to [employer] diet when this constipation issue started  Has lost some weight    Past Medical History   Diagnosis Date   . Obesity    . Pap smear abnormality of cervix      Past Surgical History   Procedure Laterality Date   . No prior surgeries           O  BP 116/78 mmHg  Pulse 99  Temp(Src) 99 F (37.2 C) (Temporal)  Resp 18  Ht 5' 3.5" (1.613 m)  Wt 232 lb (105.235 kg)  BMI 40.45 kg/m2  LMP 10/05/2014  In gen in NAD  Abdomen is soft, non-distended, non-tender. No rebound or guarding. No organomegaly. Femoral pulses intact.  Cardiac:rrr/no m/r/g  Chest: CTA    A/P  (R14.0) Bloating symptom  (primary encounter diagnosis)  Plan: discussed seeing GI  Trial of probiotics and high fiber diet  URINE PREGNANCY TEST HCG, ONSITE-negative            (K59.00) Constipation, unspecified constipation type  Plan: will refer to GI  Symptoms may be from IBS  Discussed benefits of regular exercise  REFERRAL TO GASTROENTEROLOGY            I spent a total time of 28 minutes face-to-face with the patient, of which more than 50% was spent counseling and coordinating care as outlined in this note.

## 2014-10-08 NOTE — Progress Notes (Signed)
Kristen Salinas is a 24 year old female who presents today for IUD insertion.    A visit for counseling and discussion of this procedure took place at a separate visit. Cervical assay for chlamydia and gonorrhea was done 09/17/14 and result was negative. Pap smear was done at an outside facility. Patient reports she's supposed to repeat in one year. A urine pregnancy test was done and the result was negative. Patient's last menstrual period was 10/05/2014.    PROCEDURE NOTE:  INTRAUTERINE DEVICE INSERTION  Mirena  Lot number: [id number]  Exp: 09/18    Indication:  Desire for contraception  Allergic to anesthetic, latex, iodine: NO    Review of contraindications:    Known or suspected pregnancy: NO    Distorted uterine cavity (cannot accommodate an IUD): NO    Current breast cancer (Levonorgestrel IUD only): NO     Cervical cancer, awaiting treatment: NO    Endometrial cancer: NO    Current pelvic infection: NO    Unexplained abnormal uterine bleeding: NO    History of Wilson's disease (Copper IUD only): N/A   In compliance with federal regulations, the patient was given the informational brochure that accompanies the IUD: YES  The risks and benefits of use of the IUD were discussed with the patient: YES    Risks include, but are not limited to:   1. The remote chance of contraceptive failure   2. Increased risk of ectopic pregnancy, infection, and/or miscarriage if pregnancy occurs   3. Risk of PID if exposed to sexually transmitted diseases   4. Risk of heavier periods and dysmenorrhea (Copper IUD only)    5. Risk of irregular periods and amenorrhea (Levonorgestrel IUD only)    6. Risk of embedment of the IUD into the endometrium and consequent difficulty in removal   7. Risk of uterine perforation, possible damage to intraabdominal organs and/or need for surgical intervention.       Correct patient NAME and ID YES   Correct PROCEDURE YES   Correct EQUIPMENT and SETTINGS YES   Completed CONSENT YES, Date:  10/08/2014       After obtaining informed consent, the patient was premedicated with lorazepam 1mg  sublingually, in two separate 0.5mg  doses.   The patient was placed in the dorsal lithotomy position.  Bimanual exam showed the uterus to be in the neutral position.  A speculum was inserted into the vagina.  The cervix was prepped with povidone iodine and a single tooth tenaculum was applied to the anterior lip of the cervix. A Paracervical block was placed in the usual fashion, using 20cc of 1% lidocaine without epinephrine.  A sterile uterine sound was used to sound the uterus to a depth of 7 cm.  A Mirena intrauterine device was inserted to the fundus using the prepackaged inserter.  The IUD strings were trimmed to a length of 3 cm from the cervical os.    The patient tolerated the procedure well.  Complications: none  I performed the entire procedure: YES    Postprocedure education was performed, including the following points:  1. You can manually check your IUD strings and were taught how to do this, but this is not required.   2. If you are concerned you are pregnant, call your doctor.   3. Seek immediate care for signs of infection, including pelvic pain, vaginal discharge or excessive or intermenstrual bleeding, and fever.   4. If you have heavy cramping and are concerned that your IUD  has been expelled, please call your provider.   5. The MIRENA  IUD should be removed 5-7 years after insertion (past 5 years discussed with patient that it was off-label use).   6. It is still necessary to return for periodic check-ups, pap smears, etc at a time interval recommended by your provider.    Patient instructed to return for follow-up after next menses (4-6 weeks).

## 2014-10-09 ENCOUNTER — Encounter (INDEPENDENT_AMBULATORY_CARE_PROVIDER_SITE_OTHER): Payer: No Typology Code available for payment source | Admitting: Internal Medicine

## 2014-10-09 ENCOUNTER — Ambulatory Visit: Payer: Self-pay

## 2014-10-09 ENCOUNTER — Ambulatory Visit (INDEPENDENT_AMBULATORY_CARE_PROVIDER_SITE_OTHER): Payer: No Typology Code available for payment source | Admitting: Family

## 2014-10-09 ENCOUNTER — Ambulatory Visit (INDEPENDENT_AMBULATORY_CARE_PROVIDER_SITE_OTHER): Payer: No Typology Code available for payment source | Admitting: Family Medicine

## 2014-10-09 ENCOUNTER — Encounter (HOSPITAL_BASED_OUTPATIENT_CLINIC_OR_DEPARTMENT_OTHER): Payer: Self-pay | Admitting: Obstetrics & Gynecology

## 2014-10-09 ENCOUNTER — Encounter (INDEPENDENT_AMBULATORY_CARE_PROVIDER_SITE_OTHER): Payer: Self-pay | Admitting: Family

## 2014-10-09 ENCOUNTER — Telehealth (INDEPENDENT_AMBULATORY_CARE_PROVIDER_SITE_OTHER): Payer: Self-pay | Admitting: Family Medicine

## 2014-10-09 ENCOUNTER — Ambulatory Visit (INDEPENDENT_AMBULATORY_CARE_PROVIDER_SITE_OTHER): Payer: Self-pay | Admitting: Obstetrics & Gynecology

## 2014-10-09 VITALS — BP 136/84 | HR 80 | Wt 235.0 lb

## 2014-10-09 DIAGNOSIS — B999 Unspecified infectious disease: Secondary | ICD-10-CM

## 2014-10-09 DIAGNOSIS — Z30432 Encounter for removal of intrauterine contraceptive device: Secondary | ICD-10-CM

## 2014-10-09 DIAGNOSIS — Z30431 Encounter for routine checking of intrauterine contraceptive device: Secondary | ICD-10-CM

## 2014-10-09 DIAGNOSIS — R102 Pelvic and perineal pain: Secondary | ICD-10-CM

## 2014-10-09 DIAGNOSIS — R109 Unspecified abdominal pain: Secondary | ICD-10-CM

## 2014-10-09 MED ORDER — DOXYCYCLINE MONOHYDRATE 100 MG OR CAPS
100.0000 mg | ORAL_CAPSULE | Freq: Two times a day (BID) | ORAL | Status: AC
Start: 2014-10-09 — End: 2014-10-23

## 2014-10-09 MED ORDER — CEFTRIAXONE SODIUM 250 MG IJ SOLR
250.0000 mg | Freq: Once | INTRAMUSCULAR | Status: AC
Start: 2014-10-09 — End: 2014-10-09
  Administered 2014-10-09: 250 mg via INTRAMUSCULAR

## 2014-10-09 NOTE — Telephone Encounter (Signed)
CONFIRMED PHONE NUMBER: [phone]  CALLERS FIRST AND LAST NAME: Kristen BridgemanJessica Nicole Salinas  FACILITY NAME: na TITLE: na  CALLERS RELATIONSHIP:Self  RETURN CALL: General message OK     [facility] only) Texting is an option for this clinic. If you would like us to use this option, which mobile phone number should we text to if we are unable to reach you?    SUBJECT: General Message   REASON FOR REQUEST: Patient would like to speak with a nurse regarding her IUD appointment yesterday. Please Advise. Thank you      MESSAGE: see above.

## 2014-10-09 NOTE — Patient Instructions (Signed)
It was a pleasure to see you in clinic today.                  If you are not yet signed up for eCare, please speak with a team member at the front desk who would happy to assist you with this process.      eCare  enrollment will allow you access to the below benefits   You can make appointments online   View test results / Lab Results   Obtain a copy of our After Visit Summary (a summary of your visit today)     Your Test Results:  If labs were ordered today the results are expected to be available via eCare in about 5 days. If you have an active eCare account, this is how we will notify you of your results.     If you do not have an eCare account then your test results will be mailed to you within about 14 days after your tests are completed. If your physician needs to change your care based on your results or is concerned, you will receive a phone call.     If you have any questions about your test results please schedule an appointment with your provider.    **If it has been more than 2 weeks and you have not received your test results please send our office a message via eCare.    Medication Refills: If you need a prescription refilled, please contact your pharmacy 1 week before your current supply will run out to request the refill.  Contacting your pharmacy is the fastest and safest way to obtain a medication refill.  The pharmacy will notify our office.  Please note, that a minimum of 48 to 72 hours is needed to refill a medication, but refills are usually processed and sent to your pharmacy in about 5 business days.  Please call your pharmacy early to allow enough time to refill before you anticipate running out.  For faster medication refills, you can also schedule an appointment with your provider.    We know you have a choice in where you receive your healthcare and we sincerely thank you for trusting [facility] with your health.

## 2014-10-09 NOTE — Telephone Encounter (Signed)
-----   Message from Myrtie Somaneanna M Nelson sent at 10/09/2014  5:20 PM PST -----  Regarding: PT had an IUD placed yesterday/  Pt is having right lower back pain and urine flow is different   >> Myrtie SomanDEANNA M NELSON 10/09/2014 05:20 PM  PT had an IUD placed yesterday/  Pt is having right lower back pain and urine flow is different

## 2014-10-09 NOTE — Telephone Encounter (Signed)
Sent ecare reply

## 2014-10-09 NOTE — Telephone Encounter (Signed)
Routing to RN- Question about IUD

## 2014-10-09 NOTE — Progress Notes (Signed)
Reason for visit: Vaginal Problem   Reviewed eCare status with Patient:  NO    HEALTH MAINTENANCE:  Has the patient had any of these since their last visit?    Cervical screening/PAP: N/A     Mammo: N/A    Colon Screen: N/A    Have you seen a specialist since your last visit: No        HM Due:   Health Maintenance   Topic Date Due   . HIV Screen  12-13-92   . Pap Smear  08/30/2015   . Chlamydia Screen  09/18/2015   . Tetanus Vaccine  12/26/2022   . Influenza Vaccine  Completed   . HPV Vaccine  Completed           Future Appointments  Date Time Provider Department Center   10/09/2014 7:20 PM Posey BoyerLy, Amy Bonnie, ARNP [id number]   10/13/2014 11:00 AM Mandell, Remo Lippsandy Brian, MD [facility]

## 2014-10-09 NOTE — Telephone Encounter (Signed)
CONFIRMED PHONE NUMBER: [phone]  CALLERS FIRST AND LAST NAME: Janae BridgemanJessica Nicole Mccants  FACILITY NAME: n/a TITLE: n/a  CALLERS RELATIONSHIP:Self  RETURN CALL: Detailed message on voicemail only     [facility] only) Texting is an option for this clinic. If you would like us to use this option, which mobile phone number should we text to if we are unable to reach you?    SUBJECT: MEDICAL QUESTIONS    REASON FOR REQUEST: patient advised she would like a return call from Dr.Burke to discuss her questions about the IUD    MESSAGE: please return call as soon as possible

## 2014-10-09 NOTE — Telephone Encounter (Signed)
Spoke with patient she would like to come in tonight to ask additional questions about complications regarding the IUD.

## 2014-10-09 NOTE — Telephone Encounter (Signed)
Protocol: CONTRACEPTION - IUD SYMPTOMS AND QUESTIONS-ADULT-AH  Affirmative: Caller has URGENT question and triager unable to answer question  Disposition of Call PCP Now suggested.    Patient had IUD placed yesterday. She states that she developed bilateral flank pain around 1300 today. Also states that she is having difficulty urinating. States she doesn't feel like she has to urinate but tries and the urine starts and then stops and then starts again without any control on her part. She is also crying because she didn't want the IUD, her boyfriend talked her into getting it because he states she was so moody on her previous birth control. Patient states she has [facility] at 1900 [facility] her [facility]UD. Nurse advised that patient should leave now for the clinic for her appointment in case she can get in early. If symptoms worsen between now and her clinic visit she agrees to go to the ED. Patient is so worried something is wrong and wonders if she should have the IUD removed. Advised to discuss with her provider during her visit today.

## 2014-10-09 NOTE — Progress Notes (Addendum)
SUBJECTIVE:  Kristen RuedJessica Kirschenmann is a 24 yr old female here for concerns with IUD placed yesterday 10/08/14. She is very anxious about having it, reports abdominal cramping, bleeding has subsided, chills, back pain, and urine stream "flow stops and goes" which was not noticeable until today.     ROS:  Constitutional: As noted in HPI above   Gastrointestinal: Positive for cramping & constipation.   Genitourinary: As noted in HPI above  Psych: Anxiety due to new IUD      OBJECTIVE:  BP 136/84 mmHg  Pulse 80  Wt 235 lb (106.595 kg)  SpO2 96%  LMP 10/05/2014    Temp: 99.2     PHYSICAL EXAM:  General: alert, moderate distress, tearful    Speculum exam: IUD strings present, no indication of IUD expulsion; tenderness & pain with bi-manual exam.    Scant bloody mucus around cervix.        After speaking with the patient, she requested that the IUD be removed. Given low grade temp, chills, possible PID and/or infection related to IUD, starting pt on ABX treatment.    IUD removed by Dr. Eulas Postead-Williams in the clinic today.     Patient reports currently being on Depo & needs a shot due on 11/19/14    ASSESSMENT/PLAN:  1)(B99.9) (Possible) Infection  (primary encounter diagnosis)  Plan: - Doxycycline Monohydrate 100 MG Oral Cap,         -CefTRIAXone 250 mg/vial injection     Follow up as needed or if symptoms worsen/fail to improve

## 2014-10-09 NOTE — Telephone Encounter (Signed)
Protocol: CONTRACEPTION - IUD SYMPTOMS AND QUESTIONS-ADULT-[place]  Negative: Shock suspected (e.g., cold/pale/clammy skin, too weak to stand, low BP, rapid pulse)  Negative: Sounds like a life-threatening emergency to the triager  Negative: Abdominal pain and pregnant < 20 weeks  Negative: Vaginal bleeding and pregnant < 20 weeks  Negative: Vaginal discharge is main symptom  Negative: SEVERE abdominal pain (e.g., excruciating) and present > 1 hour  Negative: SEVERE vaginal bleeding (i.e., soaking 2 pads or tampons per hour and present 2 or more hours)  Negative: MODERATE vaginal bleeding (i.e., soaking 1 pad or tampon per hour and present > 6 hours)  Negative: Constant abdominal pain and present > 2 hours  Negative: Patient sounds very sick or weak to the triager  Negative: Caller has URGENT question and triager unable to answer question  Negative: Pregnant  Negative: Periods with > 6 soaked pads or tampons per day and last > 7 days  Negative: Worried that IUD is not in right place (e.g., not able to feel the IUD string, string longer than usual, can feel hard plastic of IUD)  Negative: IUD came out (e.g., has IUD in her hand)  Negative: Bad smelling vaginal discharge  Negative: Abnormal color vaginal discharge (i.e., yellow, green, gray)  Negative: Patient wants to be seen  Negative: Periods with > 6 soaked pads or tampons per day  Negative: Periods last > 7 days  Negative: Periods are WORSE (more bleeding or pain) since IUD was placed and more than 3 months (3 menstrual cycles) since IUD was placed  Negative: Bleeding or spotting between periods since IUD was placed and more than 3 months (3 menstrual cycles) since IUD was placed  Negative: Has hormonal IUD (e.g., Mirena) and more than 5 years since insertion  Negative: Has copper IUD (e.g., ParaGard) and more than 10 years since insertion  Negative: Wants to get IUD removed  Affirmative: Bleeding or spotting between periods since IUD was placed and 3 or less months  (3 menstrual cycles) since IUD was placed  Disposition of Home Care suggested.

## 2014-10-09 NOTE — Telephone Encounter (Signed)
Dr. Laurell JosephsBurke please advise. Thanks.

## 2014-10-09 NOTE — Telephone Encounter (Signed)
Placed call to patient at the request of the provider to obtain more details about scheduled appointment tonight 10/09/14 at 7 pm.  CCR: When patient returns call please transfer to front desk ([phone])

## 2014-10-10 ENCOUNTER — Telehealth (INDEPENDENT_AMBULATORY_CARE_PROVIDER_SITE_OTHER): Payer: Self-pay | Admitting: Family Medicine

## 2014-10-10 NOTE — Telephone Encounter (Signed)
She will need to be seen , we will need to discuss the dep shot in details and if appropriate will give it on the day that she comes in    Please advise her to make an appointment with a provider 1st

## 2014-10-10 NOTE — Telephone Encounter (Signed)
CONFIRMED PHONE NUMBER: [phone]  CALLERS FIRST AND LAST NAME: Janae BridgemanJessica Nicole Lunde  FACILITY NAME: na TITLE: na  CALLERS RELATIONSHIP:Self  RETURN CALL: Detailed message on voicemail only     [facility] only) Texting is an option for this clinic. If you would like us to use this option, which mobile phone number should we text to if we are unable to reach you?    SUBJECT: General Message   REASON FOR REQUEST: Depo Shot    MESSAGE: Pt wanted to make sure that the clinic was aware that she had a removal of her IUD on 10/09/14 and has scheduled an appointment for a Depo Shot on 11/13/14. Pt wants to make sure that there are no miscommunications or confusions regarding receiving the Depo Shot.

## 2014-10-10 NOTE — Addendum Note (Signed)
Addended byRubye Beach: Hoover Grewe B on: 10/10/2014 10:10 AM     Modules accepted: Orders, Medications

## 2014-10-10 NOTE — Telephone Encounter (Signed)
Routing to Dr. Irine Sealiddee to order depo shot in time for patients appointment 11/13/2014.

## 2014-10-10 NOTE — Telephone Encounter (Signed)
Routing to the front desk to assist with scheduling.

## 2014-10-10 NOTE — Telephone Encounter (Signed)
Pt scheduled for a consult on 10/22/14 w/Dr. Diddee.  Closing TE

## 2014-10-10 NOTE — Telephone Encounter (Signed)
Please see Nurse Triage report  Reason for Call: IUD  Recommended Disposition: Call PCP Now  Action: Patient had IUD placement removed on 3/10 and has appt to discuss Depo shot with Dr Diddee on 3/23.

## 2014-10-11 ENCOUNTER — Ambulatory Visit: Payer: Self-pay

## 2014-10-11 ENCOUNTER — Encounter (HOSPITAL_BASED_OUTPATIENT_CLINIC_OR_DEPARTMENT_OTHER): Payer: Self-pay | Admitting: Obstetrics & Gynecology

## 2014-10-11 NOTE — Telephone Encounter (Signed)
Protocol: CONTRACEPTION - IUD SYMPTOMS AND QUESTIONS-ADULT-AH(03/10)  Affirmative: Caller has URGENT question and triager unable to answer question  Disposition of Call PCP Now suggested.    Patient had IUD taken out on 3/10. She is having vaginal bleeding and wonders how much and how long this should be expected. She had the IUD placed on 3/9 and removed on 3/10. [facility].   [facility] conferenced with patient and nurse. Provider reassured patient that bleeding was most likely d/t the hormones in the IUD and to expect this bleeding for up to a week. Patient is still anxious about the bleeding and also that she could be pregnant. Has taken a home preg test and it is negative. States she has severe anxiety over the thought of being pregnant. Has had counseling for this but still struggles with the anxiety. Also has terrible mood swings with depo and doesn't know what to do. Advised to call and follow up with the clinic on Monday morning. Advised to call back prn this weekend as needed for advice.

## 2014-10-11 NOTE — Telephone Encounter (Signed)
-----   Message from Chase CallerSuzra Martha Salinas sent at 10/11/2014 12:17 PM PST -----  Regarding: Had an IUD put in on the 9th- IUD taken out on the 10th, wondering how long the spotting would last   >> Patric DykesSUZRA MARTHA Salinas 10/11/2014 12:17 PM  Had an IUD put in on the 9th- IUD taken out on the 10th, wondering how long the spotting would last

## 2014-10-12 ENCOUNTER — Encounter (HOSPITAL_BASED_OUTPATIENT_CLINIC_OR_DEPARTMENT_OTHER): Payer: Self-pay | Admitting: Obstetrics & Gynecology

## 2014-10-13 ENCOUNTER — Institutional Professional Consult (permissible substitution) (INDEPENDENT_AMBULATORY_CARE_PROVIDER_SITE_OTHER): Payer: No Typology Code available for payment source | Admitting: Internal Medicine

## 2014-10-13 ENCOUNTER — Telehealth (INDEPENDENT_AMBULATORY_CARE_PROVIDER_SITE_OTHER): Payer: Self-pay | Admitting: Family

## 2014-10-13 ENCOUNTER — Telehealth (HOSPITAL_BASED_OUTPATIENT_CLINIC_OR_DEPARTMENT_OTHER): Payer: Self-pay | Admitting: Registered Nurse

## 2014-10-13 ENCOUNTER — Encounter (HOSPITAL_BASED_OUTPATIENT_CLINIC_OR_DEPARTMENT_OTHER): Payer: Self-pay | Admitting: Obstetrics & Gynecology

## 2014-10-13 ENCOUNTER — Telehealth (INDEPENDENT_AMBULATORY_CARE_PROVIDER_SITE_OTHER): Payer: Self-pay | Admitting: Obstetrics & Gynecology

## 2014-10-13 ENCOUNTER — Ambulatory Visit (INDEPENDENT_AMBULATORY_CARE_PROVIDER_SITE_OTHER): Payer: Self-pay | Admitting: Obstetrics & Gynecology

## 2014-10-13 NOTE — Telephone Encounter (Signed)
Patient already seen in clinic. Closing TE.

## 2014-10-13 NOTE — Telephone Encounter (Signed)
Called and LVM to see if pt had further questions.  Closing this TE - due to documentation in ecare message on same issue.

## 2014-10-13 NOTE — Telephone Encounter (Signed)
Protocol: VAGINAL BLEEDING - ABNORMAL-ADULT-[place]  Negative: Passed out (i.e., fainted, collapsed and was not responding)  Negative: Difficult to awaken or acting confused (e.g., disoriented, slurred speech)  Negative: Shock suspected (e.g., cold/pale/clammy skin, too weak to stand)  Negative: SEVERE bleeding (e.g., continuous red blood from vagina, or large blood clots) and very weak (can't stand)  Negative: Sounds like a life-threatening emergency to the triager  Negative: Vaginal discharge is the main symptom and bleeding is slight  Negative: SEVERE abdominal pain (e.g., excruciating)  Negative: SEVERE dizziness (e.g., unable to stand, requires support to walk, feels like passing out now)  Negative: Passed tissue (e.g., gray-white)  Negative: SEVERE vaginal bleeding (i.e., soaking 2 pads or tampons per hour and present 2 or more hours)  Negative: MODERATE vaginal bleeding (i.e., soaking pad or tampon per hour and present > 6 hours)  Negative: Pale skin (pallor) of new onset or worsening  Negative: Constant abdominal pain lasting > 2 hours  Negative: Patient sounds very sick or weak to the triager  Negative: Taking Coumadin (warfarin) or other strong blood thinner, or known bleeding disorder (e.g., thrombocytopenia)  Negative: Skin bruises or nosebleed and not caused by an injury  Negative: Bleeding/spotting after procedure (e.g., biopsy) or pelvic examination (e.g., pap smear) that persists > 3 days  Negative: Patient wants to be seen  Negative: Periods with > 6 soaked pads or tampons per day  Negative: Periods last > 7 days  Negative: Missed period has occurred 2 or more times in the last year and the cause is not known  Negative: Menstrual cycle < 21 days OR > 35 days, and occurs more than two cycles (2 months) this past year  Negative: Bleeding or spotting between regular periods occurs more than two cycles (2 months),  Negative: Bleeding or spotting between regular periods occurs more than two cycles (2  months), and using birth control medicine (e.g., pills, patch, Depo-Provera, Implanon, vaginal ring, Mirena IUD)  Negative: Bleeding or spotting occurs after hysterectomy  Negative: Age > 39 years with irregular or excessive bleeding  Affirmative: MILD bleeding or SPOTTING after procedure (e.g., biopsy) or pelvic examination (e.g., pap smear) persisting < 4 days  Disposition of Home Care suggested.   Pt reports 4 days ago had IUD taken out after only having it in a couple days. She has been spotting the past 4 days, not saturating any pads. She has mild off/on cramps but no fever and rates abd discomfort 4/10.    Given guidelines per protocol on ER and instructed to take home pregnancy test. Also instructed to use OTC yeast meds for 3 days as she reports mild itching noted. She denies any vaginal discharge besides the spotting.    [place] and says her insurance will only allow her to go to ER so she wanted to avoid that but wanted guidelines. Instructed her to callback or be seen if her spotting lasts a week or any symptoms worsen.

## 2014-10-13 NOTE — Telephone Encounter (Signed)
Duplicate encounter - closing this encounter to avoid confusion.

## 2014-10-13 NOTE — Telephone Encounter (Signed)
CONFIRMED PHONE NUMBER: [phone]  CALLERS FIRST AND LAST NAME: Janae BridgemanMorrison, Noah Nicole  FACILITY NAME: / TITLE: /  CALLERS RELATIONSHIP:Self  RETURN CALL: Detailed message on voicemail only     [facility] only) Texting is an option for this clinic. If you would like us to use this option, which mobile phone number should we text to if we are unable to reach you?    SUBJECT: General Message   REASON FOR REQUEST: Per patient's request    MESSAGE: [place] and would like a call back from Myer Hafflison Burke (she said someone told her provider would be in today). Patient's concerned with bleeding after removal of IUD, possible infection. She would like a call back to discuss this and has questions about current antibiotics as well as possible referral to a location nearby.  CCR attempted to warm transfer. Thank you

## 2014-10-13 NOTE — Telephone Encounter (Signed)
Routing to nurse traige

## 2014-10-13 NOTE — Telephone Encounter (Signed)
Routing to dr. Laurell JosephsBurke and ob/gyn nurse

## 2014-10-13 NOTE — Telephone Encounter (Signed)
See ecare reply - pt also called today and duplicate encounter was opened.

## 2014-10-13 NOTE — Telephone Encounter (Signed)
Please inform patient:    Stop the medication Doxycycline  .  Return to clinic for follow up if she is still having the symptoms for further evaluation.    Thank you for letting her know.

## 2014-10-13 NOTE — Telephone Encounter (Signed)
Pt reports that bleeding has decreased significantly and is now barely appreciable. Pt denies further questions at this time and states she will call with future concerns.

## 2014-10-13 NOTE — Telephone Encounter (Signed)
Attempt to Contact    (first attempted contact)    Left voice message:      Informing pt that we are attempting to follow up with them regarding stated symptoms.     Requesting that pt contact Nurse Triage at their earliest convenience.    Will attempt future contact.

## 2014-10-13 NOTE — Telephone Encounter (Signed)
Routing to Clinic Staff for follow up

## 2014-10-13 NOTE — Telephone Encounter (Signed)
Called and spoke to patient.     Patient states that the Doxycycline is making her feel nauseous and she wants to know if she can discontinue the medication.   Patient states that we did not run any tests to see if she had an "infection", and doesn't want to be on antibiotics if there is no reason for it.     ASSESSMENT/PLAN:  1)(B99.9) (Possible) Infection (primary encounter diagnosis)  Plan: - Doxycycline Monohydrate 100 MG Oral Cap,    -CefTRIAXone 250 mg/vial injection     Follow up as needed or if symptoms worsen/fail to improve    Routing to [name] and covering provider to assist.

## 2014-10-13 NOTE — Telephone Encounter (Signed)
Responded to pt in ecare sent today

## 2014-10-13 NOTE — Telephone Encounter (Signed)
CONFIRMED PHONE NUMBER: [phone]  CALLERS FIRST AND LAST NAME: Janae BridgemanJessica Nicole Colee  FACILITY NAME: na TITLE: na  CALLERS RELATIONSHIP:Self  RETURN CALL: Detailed message on voicemail only     [facility] only) Texting is an option for this clinic. If you would like us to use this option, which mobile phone number should we text to if we are unable to reach you?    SUBJECT: General Message   REASON FOR REQUEST: Medication Question - Doxycycline Monohydrate 100 MG Oral Cap    MESSAGE: Pt wants to speak to the clinic to discuss whether she should continue to take the above med. Please call back with advise. Thanks!

## 2014-10-13 NOTE — Telephone Encounter (Signed)
Pt is calling to speak with Dr. Laurell JosephsBurke. She was told that [place] in [place] today and I told her she wasn't. Pt is out of state and wanted to talk about her IUD that was given by her. If she can please have Dr. Laurell JosephsBurke call her on her cell phone [phone]

## 2014-10-14 ENCOUNTER — Encounter (HOSPITAL_BASED_OUTPATIENT_CLINIC_OR_DEPARTMENT_OTHER): Payer: Self-pay | Admitting: Obstetrics & Gynecology

## 2014-10-14 ENCOUNTER — Telehealth (INDEPENDENT_AMBULATORY_CARE_PROVIDER_SITE_OTHER): Payer: Self-pay | Admitting: Obstetrics & Gynecology

## 2014-10-14 NOTE — Telephone Encounter (Signed)
Duplicate encounter - See response in previous ecare message encounter - closing this encounter to avoid confusion

## 2014-10-14 NOTE — Telephone Encounter (Signed)
CONFIRMED PHONE NUMBER: [phone]  CALLERS FIRST AND LAST NAME: Joellyn RuedJessica Khatoon    CALLERS RELATIONSHIP:Self  RETURN CALL: Detailed message on voicemail only    SUBJECT: General Message   REASON FOR REQUEST: Returing Nigel Bertholdandace Wright call. Pt was in ER last night.       MESSAGE: Pt really needs to speak with you. She also needs records from them sent to us.

## 2014-10-14 NOTE — Telephone Encounter (Signed)
closing - multiple encounters open

## 2014-10-14 NOTE — Telephone Encounter (Signed)
Called and spoke to patient.   Patient states that she already stopped taking medication yesterday.     Closing TE.

## 2014-10-14 NOTE — Telephone Encounter (Signed)
Closing this encounter - multiple open encounters on same concern.

## 2014-10-14 NOTE — Telephone Encounter (Signed)
Consolidating multiple encounters on same issue - TE and ecare messages    Medical Advice   10/13/2014 10:36 PM Reply   To: [facility]     From: Janae BridgemanJessica Nicole Leahy     Created: 10/13/2014 10:36 PM       *-*-*This message has not been handled.*-*-*     Thank you that makes me feel a lot better knowing that. I had one more question, I had just visted the er as the bleeding was causing me to be concernd, they did a blood pregnancy test, my question is last time I had intercourse was 2 weeks and 3 days ago, would the blood test be accurate by now?         CONFIRMED PHONE NUMBER: [phone]  CALLERS FIRST AND LAST NAME: Joellyn RuedJessica Godby    CALLERS RELATIONSHIP:Self  RETURN CALL: Detailed message on voicemail only    SUBJECT: General Message   REASON FOR REQUEST: Returing Ausencio Vaden call. Pt was in ER last night.     MESSAGE: Pt really needs to speak with you. She also needs records from them sent to us.

## 2014-10-14 NOTE — Telephone Encounter (Signed)
In continuation to 10/14/14 ecare msg. Routing to dr Laurell Josephsburke and ob nurse

## 2014-10-15 NOTE — Telephone Encounter (Signed)
Closing this encounter - pt started another ecare message - documented phone call in that encounter.

## 2014-10-15 NOTE — Telephone Encounter (Signed)
Downloaded ER visit results/notes through Care Everywhere.  Reviewed results prior to calling pt back.  [place] [facility] [place] [place].  She is traveling  Confirmed that the serum pregnancy test was negative with pt.  Pt would like result sent to her in ecare (sent) for her peace of mind.    Discussed very low likelihood of pregnancy given timing of sex, IUD placement and removal and also on depo-provera.  Discussed that if she is concerned due to possible pregnancy sx (breast tenderness, fatigue, nausea) then she should take another home pregnancy test.  Pt does not want to be pregnant - may abstain from sex completely for a while.     Discussed her vaginal bleeding concerns.  She reports that  The bleeding has significantly decreased yesterday and today.  Reports she had heavier bleeding than she had in a long time.  Bleeding was not enough to saturate pads - just more than she is use to.  States she had to wipe for 5 minutes after using the bathroom before she had no blood on the toilet paper.  She denied clots. Some cramping at that time.   Currently she has scant dark red/brownish blood with wiping.  Denies cramping.      Pt has appointment with Dr.Didee next week to discuss birth control. Discussed irregular bleeding can occur with depo-provera.  Reviewed warning signs with patient including heavy bleeding, soaking a pad in an hour or less, golf ball sized clots or larger, abdominal pain/tenderness or fever.    Pt states understanding.  No further questions or concerns at this time.    Routing to provider as Lorain ChildesFYI. Closing TE

## 2014-10-16 ENCOUNTER — Ambulatory Visit (INDEPENDENT_AMBULATORY_CARE_PROVIDER_SITE_OTHER): Payer: Self-pay | Admitting: Family Medicine

## 2014-10-16 ENCOUNTER — Telehealth (INDEPENDENT_AMBULATORY_CARE_PROVIDER_SITE_OTHER): Payer: Self-pay | Admitting: Family Medicine

## 2014-10-16 NOTE — Telephone Encounter (Signed)
Protocol: VAGINAL BLEEDING - ABNORMAL-ADULT-OH(03/14)  Negative: Passed out (i.e., fainted, collapsed and was not responding)  Negative: Difficult to awaken or acting confused (e.g., disoriented, slurred speech)  Negative: Shock suspected (e.g., cold/pale/clammy skin, too weak to stand)  Negative: SEVERE bleeding (e.g., continuous red blood from vagina, or large blood clots) and very weak (can't stand)  Negative: Sounds like a life-threatening emergency to the triager  Negative: Vaginal discharge is the main symptom and bleeding is slight  Negative: SEVERE abdominal pain (e.g., excruciating)  Negative: SEVERE dizziness (e.g., unable to stand, requires support to walk, feels like passing out now)  Negative: Passed tissue (e.g., gray-white)  Negative: SEVERE vaginal bleeding (i.e., soaking 2 pads or tampons per hour and present 2 or more hours)  Negative: MODERATE vaginal bleeding (i.e., soaking pad or tampon per hour and present > 6 hours)  Negative: Pale skin (pallor) of new onset or worsening  Negative: Constant abdominal pain lasting > 2 hours  Negative: Patient sounds very sick or weak to the triager  Negative: Taking Coumadin (warfarin) or other strong blood thinner, or known bleeding disorder (e.g., thrombocytopenia)  Negative: Skin bruises or nosebleed and not caused by an injury  Affirmative: Bleeding/spotting after procedure (e.g., biopsy) or pelvic examination (e.g., pap smear) that persists > 3 days  Disposition of See Today in Office  suggested.   Pt has had multiple triage since IUD removal. Pt still has depo on board per pt. She had menses early March per notes and now spotting since IUD removal. See TE for more detailed notes. TE sent to PCP and discussed pt with clinic [facility]. PCP will be in office tomorrow.    Pt reassured that she does not need to go to ER at this time. She is out of town and ER is her only insurance option for care she reports. She of course can go to ER if concerned. She also was  instructed for fever, abd pain or sign or burning/pain with urination she should be seen. She can callback any time.

## 2014-10-16 NOTE — Telephone Encounter (Signed)
PCP: please advise on need for MD call to pt directly:     Pt reports [facility] but no improvement. She reports what was a brown spotting is today a red spotting. She had taken an advil for mild cramps and wonders if the spotting turned red due to the advil. Pt denies fever or abdominal pain, only mild pelvic cramps. She denies burning with urination but does wonder if she has a "yeast or BV" infection.    Pt is not wearing a pad, is not spotting enough to need a pad, but notes the spotting on the tissue when she wipes after voiding. She has to wipe multiple times in order to clear the spotting.    Pt wanted to know how long exactly to expect the spotting, tearful and wants "it to go away". We discussed that spotting on depo is not abnormal and her volume does not meet the requirement of needing to be seen in ER. [place]  in [place] and reports the only healthcare she can access is the ER.    Pt has appt on 3/23 and encouraged to keep that and callback if in town sooner. Instructed to callback for any worsening symptoms or be seen in the ER.    Pt expressed disappointment that no MD has called her since her procedure though she has spoken to RNs.

## 2014-10-16 NOTE — Telephone Encounter (Signed)
CONFIRMED PHONE NUMBER: [phone]  CALLERS FIRST AND LAST NAME: Joellyn RuedJessica Winget  CALLERS RELATIONSHIP:Self  RETURN CALL: Detailed message on voicemail only    Patient is calling on the line.  Patient states that she had IUD removed on 3/10.  Patient was bleeding for a couple of days.  Past two days she was bleeding light brown, and today she took an Advil and started bleeding again.  Pelvic cramping    Warm [facility] to [facility] to assist.

## 2014-10-17 NOTE — Telephone Encounter (Signed)
Called patient   [place]  She says she is doing fine now and no concerns.  She and her husband decided to avoid any hormones for her at this point they will be using other contraceptive measures    She wanted me to cancel her appointment for 23rd of March    shewill follow up in clinic as needed

## 2014-10-19 ENCOUNTER — Encounter (INDEPENDENT_AMBULATORY_CARE_PROVIDER_SITE_OTHER): Payer: Self-pay | Admitting: Family Medicine

## 2014-10-19 NOTE — Progress Notes (Signed)
Subjective:  Kristen Salinas is a 24 year old Female here with chief complaint of 1)Who was seen yesterday and had an IUD placed [facility]([place]) and has had increasing pelvic pain was placed; denies bleeding since yesterday Or vaginal discharge; patient complains of shaking chills  and difficulties with starting and stopping her urine flow; assess the patient with Kristen Salinas.    Past Medical History   Diagnosis Date   . Obesity    . Pap smear abnormality of cervix      Past Surgical History   Procedure Laterality Date   . No prior surgeries       Patient Active Problem List   Diagnosis   . Depression   . Hiatal hernia   . PTSD (post-traumatic stress disorder)   . Subacute vaginitis   . Encounter for surveillance of injectable contraceptive   . Primary insomnia   . Abnormal cervical Papanicolaou smear   . Constipation, unspecified     Current Outpatient Prescriptions   Medication Sig Dispense Refill   . Doxycycline Monohydrate 100 MG Oral Cap Take 1 capsule (100 mg) by mouth 2 times a day for 14 days. Take until gone. 28 capsule 0   . Eszopiclone 1 MG Oral Tab      . LORazepam 0.5 MG Oral Tab      . TraZODone HCl 100 MG Oral Tab Take 1.5 tablets (150 mg) by mouth at bedtime. For Insomnia. 90 tablet 3     No current facility-administered medications for this visit.     History     Social History   . Marital Status: Single     Spouse Name: N/A   . Number of Children: N/A   . Years of Education: N/A     Occupational History   . Not on file.     Social History Main Topics   . Smoking status: Former Smoker -- 0.50 packs/day for 2 years     Types: Cigarettes   . Smokeless tobacco: Never Used   . Alcohol Use: Yes      Comment: Rare   . Drug Use: No   . Sexual Activity: No      Comment: didn't tolerate Nexplanon after one year.      Other Topics Concern   . Not on file     Social History Narrative     Allergies-Prednisone  ROS: Review of Systems:  Constitutional: As noted in HPI above    Cardiovascular: Negative    Respiratory:  Negative    Gastrointestinal: Positive for Upset stomach.   Genitourinary: As noted in HPI above   OBJECTIVE:     LMP 10/05/2014  General appearance*: Color is normal, Weight appears heavy  for height.  and No acute distress  Affect*: Judgement/insight normal, Mood/affect normal, Orientation oriented to time, person and place and Recent/remote Memory normal  ABDOMINAL/GI:  Abdominal exam:  Significant findings include Some tenderness noted in the suprapubic area to palpation  Pelvic: Ext. gen.- no lesions;urethral meatus- supravaginal,normal size,no prolapse noted;urethra- no tenderness or scarring noted;vagina-normal rugae;cervix-long,closed, IUD string in place;uterus-small,firm, Moderately tender;adnexa-no masses or tenderness    IMPRESSION:  (R10.2) Pelvic pain in female  (primary encounter diagnosis)  Plan: After discussion about covering her with an antibiotic because of her symptoms, patient asked to have the IUD removed. Patient is very anxious  procedure: IUD string grasp with sponge forceps and IUD removed without difficulty.  Return to clinic as needed for follow-up

## 2014-10-22 ENCOUNTER — Encounter (INDEPENDENT_AMBULATORY_CARE_PROVIDER_SITE_OTHER): Payer: No Typology Code available for payment source | Admitting: Family Medicine

## 2014-10-22 NOTE — Addendum Note (Signed)
Addended by: Otho DarnerHOFFMAN, TANYA on: 10/22/2014 11:52 AM     Modules accepted: Level of Service

## 2014-10-23 ENCOUNTER — Encounter (INDEPENDENT_AMBULATORY_CARE_PROVIDER_SITE_OTHER): Payer: Self-pay | Admitting: Physician Assistant

## 2014-10-23 NOTE — Telephone Encounter (Signed)
Sharyn CreamerMichelle Vierra, please see patients eCare message to assist, thank you.  Please place orders if appropriate.

## 2014-10-27 ENCOUNTER — Telehealth (INDEPENDENT_AMBULATORY_CARE_PROVIDER_SITE_OTHER): Payer: Self-pay | Admitting: Family Medicine

## 2014-10-27 ENCOUNTER — Ambulatory Visit: Payer: No Typology Code available for payment source | Attending: Physician Assistant

## 2014-10-27 ENCOUNTER — Encounter (INDEPENDENT_AMBULATORY_CARE_PROVIDER_SITE_OTHER): Payer: Self-pay | Admitting: Physician Assistant

## 2014-10-27 ENCOUNTER — Ambulatory Visit (INDEPENDENT_AMBULATORY_CARE_PROVIDER_SITE_OTHER): Payer: No Typology Code available for payment source | Admitting: Physician Assistant

## 2014-10-27 ENCOUNTER — Other Ambulatory Visit (INDEPENDENT_AMBULATORY_CARE_PROVIDER_SITE_OTHER): Payer: Self-pay | Admitting: Family Medicine

## 2014-10-27 VITALS — BP 133/89 | HR 108 | Temp 99.0°F | Resp 14 | Wt 228.0 lb

## 2014-10-27 DIAGNOSIS — R300 Dysuria: Secondary | ICD-10-CM

## 2014-10-27 DIAGNOSIS — N761 Subacute and chronic vaginitis: Secondary | ICD-10-CM

## 2014-10-27 DIAGNOSIS — R102 Pelvic and perineal pain: Secondary | ICD-10-CM

## 2014-10-27 DIAGNOSIS — N949 Unspecified condition associated with female genital organs and menstrual cycle: Secondary | ICD-10-CM

## 2014-10-27 DIAGNOSIS — N912 Amenorrhea, unspecified: Secondary | ICD-10-CM

## 2014-10-27 LAB — PR U/A AUTO DIPSTICK ONLY, ONSITE
Bilirubin, Urine: NEGATIVE
Glucose, Urine: NEGATIVE mg/dL
Ketones, URN: NEGATIVE mg/dL
Leukocytes: NEGATIVE
Nitrite, URN: NEGATIVE
Protein: NEGATIVE mg/dL
Specific Gravity, Urine: 1.015 (ref 1.005–1.030)
Urobilinogen, URN: 0.2 E.U./dL (ref 0.2–1.0)
pH, URN: 5.5 (ref 5.0–8.0)

## 2014-10-27 LAB — PR WET PREP/KOH/TEST, ONSITE
Clue Cells: NEGATIVE
Odor: ABSENT
RBC, Wet Prep: NEGATIVE
Trich: NEGATIVE
Yeast, Wet Prep: NEGATIVE

## 2014-10-27 LAB — PR URINE PREGNANCY TEST HCG, ONSITE: Pregnancy (HCG) (UWNC), URN: NEGATIVE

## 2014-10-27 NOTE — Patient Instructions (Signed)
It was a pleasure to see you in clinic today.                  If you are not yet signed up for eCare, please speak with a team member at the front desk who would happy to assist you with this process.      eCare  enrollment will allow you access to the below benefits   You can make appointments online   View test results / Lab Results   Obtain a copy of our After Visit Summary (a summary of your visit today)     Your Test Results:  If labs were ordered today the results are expected to be available via eCare in about 5 days. If you have an active eCare account, this is how we will notify you of your results.     If you do not have an eCare account then your test results will be mailed to you within about 14 days after your tests are completed. If your physician needs to change your care based on your results or is concerned, you will receive a phone call.     If you have any questions about your test results please schedule an appointment with your provider.    **If it has been more than 2 weeks and you have not received your test results please send our office a message via eCare.    Medication Refills: If you need a prescription refilled, please contact your pharmacy 1 week before your current supply will run out to request the refill.  Contacting your pharmacy is the fastest and safest way to obtain a medication refill.  The pharmacy will notify our office.  Please note, that a minimum of 48 to 72 hours is needed to refill a medication, but refills are usually processed and sent to your pharmacy in about 5 business days.  Please call your pharmacy early to allow enough time to refill before you anticipate running out.  For faster medication refills, you can also schedule an appointment with your provider.    We know you have a choice in where you receive your healthcare and we sincerely thank you for trusting [facility] with your health.

## 2014-10-27 NOTE — Telephone Encounter (Signed)
Called and informed patient that no sooner appointment is available and if patient feels urgency to be seen sooner then her scheduled appointment to be seen at Urgent Care.  Patient understood and stated she will wait until her scheduled appointment with Kristen Salinas 2pm today.  Closing TE

## 2014-10-27 NOTE — Progress Notes (Signed)
Reason for visit: Infection   Reviewed eCare status with Patient:  YES    HEALTH MAINTENANCE:  Has the patient had any of these since their last visit?    Cervical screening/PAP: Complete     Mammo: N/A    Colon Screen: N/A    Have you seen a specialist since your last visit: No        HM Due: Complete  Health Maintenance   Topic Date Due   . HIV Screen  1993/[birth year]   . Pap Smear  08/30/2015   . Chlamydia Screen  09/18/2015   . Tetanus Vaccine  12/26/2022   . Influenza Vaccine  Completed   . HPV Vaccine  Completed           Future Appointments  Date Time Provider Department Center   11/06/2014 1:30 PM Leanord Asalerleth, Mark, MD [facility]   11/13/2014 11:25 AM [facility]

## 2014-10-27 NOTE — Telephone Encounter (Signed)
CONFIRMED PHONE NUMBER: [phone]  CALLERS FIRST AND LAST NAME: Kristen BridgemanJessica Nicole Salinas    FACILITY NAME: na TITLE: na  CALLERS RELATIONSHIP:Self  RETURN CALL: Detailed message on voicemail only     [facility] only) Texting is an option for this clinic. If you would like us to use this option, which mobile phone number should we text to if we are unable to reach you?    SUBJECT: Appointment Request   REASON FOR REQUEST: Patient is currently scheduled at 2pm today and would like to come in sooner.     REQUEST APPOINTMENT WITH: any   SYMPTOMS: urinary problem, - does not feel she is emptying completely and has back and stomach pain  REFERRING PROVIDER: n/a   REQUESTED DATE: 3.28.2016  REQUESTED TIME: before 2pm   UNABLE TO APPOINT: Schedule appears full

## 2014-10-27 NOTE — Telephone Encounter (Signed)
Routing to [employer] to assist with re-scheduling patient.   Patient has an appointment for today at [facility] and would like to be seen sooner.

## 2014-10-28 ENCOUNTER — Telehealth (INDEPENDENT_AMBULATORY_CARE_PROVIDER_SITE_OTHER): Payer: Self-pay | Admitting: Family Medicine

## 2014-10-28 NOTE — Telephone Encounter (Signed)
Patient was seen for these symptoms yesterday 3/28 and has an appointment tomorrow with Sharyn CreamerMichelle Vierra.

## 2014-10-28 NOTE — Telephone Encounter (Signed)
Kristen CreamerMichelle Salinas, patient would like visit summary from 3/28 posted to her account ASAP.

## 2014-10-28 NOTE — Telephone Encounter (Signed)
CONFIRMED PHONE NUMBER: [phone]  CALLERS FIRST AND LAST NAME: Kristen BridgemanJessica Nicole Salinas    FACILITY NAME: na TITLE: na  CALLERS RELATIONSHIP:Self  RETURN CALL: Detailed message on voicemail only     [facility] only) Texting is an option for this clinic. If you would like us to use this option, which mobile phone number should we text to if we are unable to reach you?    SUBJECT: General Message   REASON FOR REQUEST: wanting to talk to nurse      MESSAGE: patient states that she had nausea for week and has vomiting after eating today, please assist ASAP

## 2014-10-28 NOTE — Progress Notes (Signed)
Kristen Salinas Kristen Salinas is a 24 year old female here to discuss the following:    She is her with her partner, Kristen Salinas.    She reports difficulty with urination x 4 days. She describes this as difficulty with urine outflow and reduced frequency of urine.   She reports having only 4 episodes of of urination per day despite drinking a gallon of water or more.  Vaginal symptoms: She reports vaginal odor, but no abnormal discharge. She doesn't have a rash around the genitalia, but does feel burning and irritated about the external genitalia.  She doesn't wear underwear due to it making her sweat and concerns about that causing a rash or yeast infection.       She has had some changes in birth control over the last few months (nexplanon, IUD, depo-provera) - but contraceptive coverage has not lapsed.  She has not had a period recently.  Most recently she had a depo-provera injection    Lower abdominal cramping:  Any time she eats or drinks she has abdominal cramping and nausea.  This has been going on for several weeks.    bowel habits: some constipatoin  x- several years.  SHe has been using miralax.  She has not noted blood in stool or dark, tarry stools.      On March 5 [facility]  [place] March 16 [place]    Imaging of lower abdomen was done.  STI, HCG and urinalysis was done and there were no abnormal findings.    Today she requests pregnancy test, check for BV, UTI,    In the has two months she has taken several rounds of diflucan as well as injections of ceftriaxone and doxycycline.    She has an appointment to see [facility] on March 31      OBJECTIVE:  BP 133/89 mmHg  Pulse 108  Temp(Src) 99 F (37.2 C) (Temporal)  Resp 14  Wt 228 lb (103.42 kg)  SpO2 100%  LMP 10/05/2014  General: healthy, alert, no distress  Abdomen: normal BS.  There is tenderness to palpation all along the lower pelvis.  External genitalia: normal in appearance - minimal  erythema noted between labia majora and minora.   vaginal exam: vaginal vault is normal in appearance.  Normal amount milky discharge without odor is noted.  There is no CMT.        wet mount: no clue cells or yeast  ua: wnl  urine HCG: neg    ASSESSMENT AND PLAN:  (R30.0) Dysuria  (primary encounter diagnosis)  Plan: U/A AUTO DIPSTICK ONLY, ONSITE  reassurance given that 4 episodes of urination daily is not abnormal.     (N76.1) Subacute vaginitis  Plan: WET PREP/KOH/TEST, ONSITE, ZEBRA LABELS  discussed contact dermatitis and eliminating fabric softeners in the clothing that she will have rubbing against her vaginal area.  we also discussed that excessive wiping after urination or BMs can cause irritation to genital area.    (N94.9) Pelvic cramping  Plan: US PELVIS COMPLETE W TRANSVAG         (N91.2) Amenorrhea  Plan: URINE PREGNANCY TEST HCG, ONSITE        We will proceed as indicated based on imaging results.  appointment with [facility] is pending.    Cletis AthensMichelle Lynne Alee Gressman, [place]

## 2014-10-29 ENCOUNTER — Telehealth (INDEPENDENT_AMBULATORY_CARE_PROVIDER_SITE_OTHER): Payer: Self-pay | Admitting: Obstetrics & Gynecology

## 2014-10-29 ENCOUNTER — Ambulatory Visit (INDEPENDENT_AMBULATORY_CARE_PROVIDER_SITE_OTHER): Payer: No Typology Code available for payment source | Admitting: Physician Assistant

## 2014-10-29 ENCOUNTER — Encounter (INDEPENDENT_AMBULATORY_CARE_PROVIDER_SITE_OTHER): Payer: Self-pay | Admitting: Physician Assistant

## 2014-10-29 ENCOUNTER — Telehealth (INDEPENDENT_AMBULATORY_CARE_PROVIDER_SITE_OTHER): Payer: Self-pay | Admitting: Physician Assistant

## 2014-10-29 VITALS — BP 133/80 | HR 102 | Temp 98.8°F | Ht 63.39 in | Wt 229.0 lb

## 2014-10-29 DIAGNOSIS — F418 Other specified anxiety disorders: Secondary | ICD-10-CM

## 2014-10-29 DIAGNOSIS — N761 Subacute and chronic vaginitis: Secondary | ICD-10-CM

## 2014-10-29 MED ORDER — METRONIDAZOLE 500 MG OR TABS
500.0000 mg | ORAL_TABLET | Freq: Two times a day (BID) | ORAL | Status: DC
Start: 2014-10-29 — End: 2014-11-05

## 2014-10-29 NOTE — Progress Notes (Signed)
Reason for visit: follow up on wet mount   Reviewed eCare status with Patient:  YES    HEALTH MAINTENANCE:  Has the patient had any of these since their last visit?    Cervical screening/PAP: UTD     Mammo: N/A    Colon Screen: N/A    Have you seen a specialist since your last visit: No        HM Due:   Health Maintenance   Topic Date Due   . HIV Screen  08/06/1992   . Pap Smear  08/30/2015   . Chlamydia Screen  09/18/2015   . Tetanus Vaccine  12/26/2022   . Influenza Vaccine  Completed   . HPV Vaccine  Completed           Future Appointments  Date Time Provider Department Center   10/29/2014 10:15 AM Sharyn CreamerVierra, Michelle BelcourtLynne, [place] [facility]   10/30/2014 1:45 PM Yates DecampKrishnamurthy, Shoba, MBBS [facility]

## 2014-10-29 NOTE — Telephone Encounter (Signed)
routed to [place] - as Lorain ChildesFYI

## 2014-10-29 NOTE — Telephone Encounter (Signed)
Routing to [place]

## 2014-10-29 NOTE — Telephone Encounter (Signed)
referral done.  routing to [place] to contact patient if she is eligible -or to help her find resources.

## 2014-10-29 NOTE — Telephone Encounter (Signed)
Called patient and patient requesting to know if Kristen Salinas can place a blood lab order to confirm home pregnancy test.  Patient requesting a call back.  Please be advised.  Routing to [employer]Kerry Salinas

## 2014-10-29 NOTE — Telephone Encounter (Signed)
Patient seen in clinic today. 10/29/2014 patient email-Michelle Lynnell DikeVierra advised that another pregnancy test was not necessary. Closing TE

## 2014-10-29 NOTE — Telephone Encounter (Signed)
CONFIRMED PHONE NUMBER: [phone]  CALLERS FIRST AND LAST NAME: Kristen BridgemanJessica Nicole Salinas  FACILITY NAME: n/a TITLE: n/a  CALLERS RELATIONSHIP:Self  RETURN CALL: Detailed message on voicemail only     [facility] only) Texting is an option for this clinic. If you would like us to use this option, which mobile phone number should we text to if we are unable to reach you?    SUBJECT: General Message   REASON FOR REQUEST: Patient interested with the Behavioral Health Integrated Program    MESSAGE: Patient looking to meet with a psychiatrist for counseling and is interested on setting [facility] [place]. Please contact patient back to schedule

## 2014-10-29 NOTE — Patient Instructions (Addendum)
It was a pleasure to see you in clinic today.                  If you are not yet signed up for eCare, please speak with a team member at the front desk who would happy to assist you with this process.      eCare  enrollment will allow you access to the below benefits   You can make appointments online   View test results / Lab Results   Obtain a copy of our After Visit Summary (a summary of your visit today)     Your Test Results:  If labs were ordered today the results are expected to be available via eCare in about 5 days. If you have an active eCare account, this is how we will notify you of your results.     If you do not have an eCare account then your test results will be mailed to you within about 14 days after your tests are completed. If your physician needs to change your care based on your results or is concerned, you will receive a phone call.     If you have any questions about your test results please schedule an appointment with your provider.    **If it has been more than 2 weeks and you have not received your test results please send our office a message via eCare.    Medication Refills: If you need a prescription refilled, please contact your pharmacy 1 week before your current supply will run out to request the refill.  Contacting your pharmacy is the fastest and safest way to obtain a medication refill.  The pharmacy will notify our office.  Please note, that a minimum of 48 to 72 hours is needed to refill a medication, but refills are usually processed and sent to your pharmacy in about 5 business days.  Please call your pharmacy early to allow enough time to refill before you anticipate running out.  For faster medication refills, you can also schedule an appointment with your provider.    We know you have a choice in where you receive your healthcare and we sincerely thank you for trusting [facility] with your [facility].      Please use over the counter monistat cream on  external genitalia  May also use yeast inserts - to prevent yeast infection.

## 2014-10-29 NOTE — Telephone Encounter (Signed)
Debby, Please address as needed.

## 2014-10-29 NOTE — Telephone Encounter (Signed)
Kristen CreamerMichelle Salinas, please read ecare messag above.     Please advise.     Routing to [employer]Michelle Salinas.

## 2014-10-29 NOTE — Telephone Encounter (Signed)
CONFIRMED PHONE NUMBER: [phone]  CALLERS FIRST AND LAST NAME: Janae BridgemanJessica Nicole Trella  FACILITY NAME: N/A TITLE: N/A  CALLERS RELATIONSHIP:Self  RETURN CALL: Detailed message on voicemail only     SUBJECT: Appointment Request   REASON FOR REQUEST: Referral, vaginitis and blood draw (confirm pregnancy)    REQUEST APPOINTMENT WITH: Any  SYMPTOMS: see above  REFERRING PROVIDER: Dr Lynnell DikeVierra  REQUESTED DATE: As soon as possible  REQUESTED TIME: As soon as possible  UNABLE TO APPOINT: Schedule appears full    Patient is scheduled 04/27 at 12:30PM, requesting to come in sooner. Please call, thank you!

## 2014-10-29 NOTE — Telephone Encounter (Signed)
Routing to [employer] FinancialMichelle and Phel[employer] DodgeFront Desk Pool.     Does [name] have to place referral first or ok to schedule?

## 2014-10-29 NOTE — Telephone Encounter (Signed)
Routing to Front Desk Pool to assist in scheduling per below.

## 2014-10-30 ENCOUNTER — Encounter (INDEPENDENT_AMBULATORY_CARE_PROVIDER_SITE_OTHER): Payer: Self-pay | Admitting: Gastroenterology

## 2014-10-30 ENCOUNTER — Telehealth (INDEPENDENT_AMBULATORY_CARE_PROVIDER_SITE_OTHER): Payer: Self-pay | Admitting: Obstetrics & Gynecology

## 2014-10-30 ENCOUNTER — Ambulatory Visit: Payer: No Typology Code available for payment source | Attending: Family Medicine | Admitting: Gastroenterology

## 2014-10-30 VITALS — BP 128/79 | HR 97 | Ht 63.39 in | Wt 229.0 lb

## 2014-10-30 DIAGNOSIS — K5900 Constipation, unspecified: Secondary | ICD-10-CM

## 2014-10-30 MED ORDER — MAGNESIUM OXIDE 400 MG OR TABS
800.0000 mg | ORAL_TABLET | Freq: Every day | ORAL | Status: DC
Start: 2014-10-30 — End: 2016-02-12

## 2014-10-30 NOTE — Telephone Encounter (Signed)
CONFIRMED PHONE NUMBER: [phone]  CALLERS FIRST AND LAST NAME: Kristen BridgemanJessica Nicole Salinas  FACILITY NAME: na TITLE: na  CALLERS RELATIONSHIP:Self  RETURN CALL: Detailed message on voicemail only     [facility] only) Texting is an option for this clinic. If you would like us to use this option, which mobile phone number should we text to if we are unable to reach you?    SUBJECT: General Message   REASON FOR REQUEST: sooner appointment and questions    MESSAGE: Patient called in to request a call back from someone of Dr Mickie BailBurke's team to discuss why she needs a sooner appointment and questions about her reoccurring infections. Please follow up with patient. Thank you

## 2014-10-30 NOTE — Patient Instructions (Addendum)
1.Start Magnesium Oxide tablets 400-800mg  at bedtime  2.Continue Metamucil and probiotics  3.Try heating pad,muscle sprain liniments like Bengay and avoid movements that increase the pain-for R sided abdominal pain  4.Decrease Prilosec to once daily    Avoid:  1.Beef/Pork  2.Milk products-Milk,Cheese,Creamy foods such as creamy soups ,salad dressings or dips,desserts  3.Fruit juices ,candy,gum dried fruit [raisins,dates etc]  4.Fried ,greasy foods  5.Gluten containing foods [wheat,barley,rye]-Breads,pasta,noodles,muffins,crackers,beer etc  6.Raw  vegetables    Good foods:  1.Chicken/Fish/Turkey  2.Rice,Quinoa,Millet,Buckwheat,Amaranth,Tef,Gluten free oats,Gluten free bread/pasta-in moderation  3.Cooked vegetables-plenty  4.Fruits in small servings-preferably berries  5.Nuts and nut butters-Almond,walnuts,pistachios etc  6.Seeds-Pumpkin,Ground flax,Chia,sesame etc  7.Eggs  8.Gaucamole/Avacado,Hummus  9.Oils: Olive,Sunflower,coconut,ghee  10.Tofu/Tempeh/Miso  11.Milk substitutes-Almond,Coconut,rice and soy milk  12.Sweet potatoes,Yam.  13.Herbal Teas-Chamomile,Ginger,Dandelion root etc-Traditional Medicinals or Yogi.2-3 cups daily
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
## 2014-10-30 NOTE — Telephone Encounter (Signed)
Called and LVM for patient letting her know I do not see note saying she needs sooner appointment and that regarding the infections she would need to discuss this with Dr.Burke or her PCP. Thank you! 1st attempt

## 2014-10-30 NOTE — Telephone Encounter (Signed)
Routing to FD

## 2014-10-30 NOTE — Progress Notes (Signed)
INITIAL GASTROENTEROLOGY CLINIC NOTE    DATE OF SERVICE: 10/30/2014   REFERRING PHYSICIAN: Cletis Athens, PA  REASON FOR REFERRAL:     Patient is seen in consultation at the request of Dr. Lynnell Dike, Joyce Gross, PA for evaluation of constipation and bloating      HISTORY OF PRESENT ILLNESS:  Patient states that she's had a history of chronic constipation which is been intermittently bothersome.  She was seen at [facility] and 4:15 with constipation and history of melena and did undergo colonoscopy as well as an endoscopy in April 2015.  I did receive records of those studies and on the colonoscopy was described to be completely normal and the upper endoscopy showed focal inflammation in the stomach and an irregular Z line.  Gastric and esophageal biopsies were obtained which did not show any abnormalities.  Specifically there was no evidence of H. pylori gastritis or eosinophilic esophagitis.  Patient states that she she had been doing reasonably well until a few weeks ago when she had a recurrence of her chronic constipation with infrequent bowel movements and she has required magnesium citrate to help with that.  However she feels that the constipation is not her worst symptom now.  She has been having more nausea, mild retrosternal burning pain and upper abdominal bloating and discomfort in the last 2 weeks.  The she has a right upper abdominal discomfort which is present all the time which is increased with eating after bowel movement and with deep breathing and activity.  She denies any dysphagia or vomiting.  She has tried some Gas-X but that has not really helped.  She has been taking Metamucil and more recently and her bowel movements have improved.  Because of the upper abdominal bloating and discomfort she did go to [facility] urgent care and apparently had routine labs as well as an abdominal ultrasound which were normal.  She also had a pelvic and transvaginal ultrasound more recently  which was also normal.  She states she has lost about 20 pounds in the last month without trying.  She does report that she was in her throat trip couple weeks ago and had been eating a lot of fast food.  She denies hematochezia, melena, anorexia or jaundice.  She denies aspirin or NSAID use or EtOH or cigarette abuse or family history of any significance from the GI standpoint.  She has not been started on any new medications recently other than the Prilosec.  She is taking probiotics.  On further questioning she does recall having a viral-type syndrome with upper respiratory  symptoms prior to the onset of her GI symptoms    PAST MEDICAL HISTORY:  Active Ambulatory Problems     Diagnosis Date Noted   . Depression 02/19/2013   . Hiatal hernia 02/19/2013   . PTSD (post-traumatic stress disorder) 02/19/2013   . Subacute vaginitis 09/10/2014   . Encounter for surveillance of injectable contraceptive 09/10/2014   . Primary insomnia 09/10/2014   . Abnormal cervical Papanicolaou smear 09/14/2014   . Constipation, unspecified 10/06/2014     Resolved Ambulatory Problems     Diagnosis Date Noted   . No Resolved Ambulatory Problems     Past Medical History   Diagnosis Date   . Obesity    . Pap smear abnormality of cervix        PAST SURGICAL HISTORY:  Past Surgical History   Procedure Laterality Date   . No prior surgeries  ALL:  Review of patient's allergies indicates:  Allergies   Allergen Reactions   . Prednisone Rash       MEDS:  Outpatient Prescriptions Marked as Taking for the 10/30/14 encounter (Office Visit) with Yates DecampKrishnamurthy, Lavell Supple, MBBS   Medication Sig Dispense Refill   . CRANBERRY OR      . Fluconazole 150 MG Oral Tab      . Lansoprazole (PREVACID OR) 20 mg twice a day     . MedroxyPROGESTERone Acetate (DEPO-PROVERA IM)      . Omeprazole 20 MG Oral CAPSULE DELAYED RELEASE      . Ondansetron 4 MG Oral TABLET DISPERSIBLE      . Probiotic Product (SOLUBLE FIBER/PROBIOTICS OR)      . Psyllium (METAMUCIL OR)       . TraZODone HCl 100 MG Oral Tab Take 1.5 tablets (150 mg) by mouth at bedtime. For Insomnia. 90 tablet 3       REVIEW OF SYSTEMS:  ROS as documented on the Patient Information Form completed by the patient and this was reviewed with the patient.    SOCIAL HISTORY:  History     Social History   . Marital Status: Single     Spouse Name: N/A   . Number of Children: N/A   . Years of Education: N/A     Occupational History   . Not on file.     Social History Main Topics   . Smoking status: Former Smoker -- 0.50 packs/day for 2 years     Types: Cigarettes   . Smokeless tobacco: Never Used   . Alcohol Use: Yes      Comment: Rare   . Drug Use: No   . Sexual Activity: No      Comment: didn't tolerate Nexplanon after one year.      Other Topics Concern   . Not on file     Social History Narrative       FAMILY HISTORY:  No FH of gastrointestinal diseases or malignancies.  Family History   Problem Relation Age of Onset   . Family history unknown: Yes       PHYSICAL EXAMINATION:  Filed Vitals:    10/30/14 1331   BP: 128/79   Pulse: 97   Height: 5' 3.39" (1.61 m)   Weight: 229 lb (103.874 kg)   SpO2: 100%     GEN: well-kept, well-developed, normal weight, NAD  HEENT: anicteric, atraumatic, non-erythematous OP  NECK: no cervical LAD, supple  CHEST: CTA bilat  CV: nl s1, s2 with no MRG, no JVD  ABD: +BS, soft, Mild tendernes RUQ and over R anterior rib cage on superficial palpation, NG, non-tympanic, no hepatosplenomegaly  RECTAL:   JOINT:  no increased erythema or heat  EXTR: no LE edema bilat  SKIN: no rashes  PSYCH: normal judgement and insight  NEURO: A&Ox3,  nl gait      LAB  Office Visit on 10/27/2014   Component Date Value Ref Range Status   . Color, Urine 10/27/2014 YELLOW   Final   . Clarity, URN 10/27/2014 CLEAR   Final   . Glucose, Urine 10/27/2014 NEG  NEG mg/dL Final   . Bilirubin, Urine 10/27/2014 NEG  NEG Final   . Ketones, URN 10/27/2014 NEG  NEG mg/dL Final   . Specific Gravity, Urine 10/27/2014 1.015  1.005 -  1.030 Final   . Occult Blood, URN 10/27/2014 TRACE-NH* NEG Final   . pH, URN 10/27/2014 5.5  5.0 -  8.0 Final   . Protein 10/27/2014 NEG  NEG-TRACE mg/dL Final   . Urobilinogen, URN 10/27/2014 0.2  0.2 - 1.0 E.U./dL Final   . Nitrite, URN 10/27/2014 NEG  NEG Final   . Leukocytes 10/27/2014 NEG  NEG Final   . Odor 10/27/2014 ABSENT   Final   . WBC 10/27/2014 2+   Final   . RBC, Wet Prep 10/27/2014 NEG   Final   . Bacteria 10/27/2014 4+   Final   . Clue Cells 10/27/2014 NEG   Final   . Yeast, Wet Prep 10/27/2014 NEG   Final   . Epithelial Cells, Wet Prep 10/27/2014 3+   Final   . Ivery Quale 10/27/2014 NEG   Final   . Pregnancy (HCG) ([facility]), URN 10/27/2014 NEGATIVE   Final   . INTERNAL CONTROL 10/27/2014 Control Verified   Final   Office Visit on 10/08/2014   Component Date Value Ref Range Status   . Pregnancy (HCG) [facility]), URN 10/08/2014 NEG   Final   . INTERNAL CONTROL 10/08/2014 Control Verified   Final   Office Visit on 10/06/2014   Component Date Value Ref Range Status   . Pregnancy (HCG) [facility]), URN 10/06/2014 NEGATIVE   Final   . INTERNAL CONTROL 10/06/2014 Control Verified   Final   Office Visit on 09/30/2014   Component Date Value Ref Range Status   . Odor 09/30/2014 ABSENT   Final   . WBC 09/30/2014 NEGATIVE   Final   . RBC, Wet Prep 09/30/2014 1+   Final   . Bacteria 09/30/2014 NEGATIVE   Final   . Clue Cells 09/30/2014 NEGATIVE   Final   . Yeast, Wet Prep 09/30/2014 NEGATIVE   Final   . Epithelial Cells, Wet Prep 09/30/2014 1+   Final   . Trich 09/30/2014 NEGATIVE   Final   Office Visit on 09/17/2014   Component Date Value Ref Range Status   . Odor 09/17/2014 ABSENT   Final   . WBC 09/17/2014 NEG   Final   . RBC, Wet Prep 09/17/2014 NEG   Final   . Bacteria 09/17/2014 3+   Final   . Clue Cells 09/17/2014 NEG   Final   . Yeast, Wet Prep 09/17/2014 1+*  Final   . Epithelial Cells, Wet Prep 09/17/2014 2+   Final   . Ivery Quale 09/17/2014 NEG   Final   . Special Requests 09/17/2014 Sensitivity   Final   . Stain For Fungus  09/17/2014 No fungal elements seen   Final   . Culture 09/17/2014 No fungus isolated in 1 week   Final   . GC&Chlam NA Spec Desc 09/17/2014 Vaginal Specimen   Final   . Chlam Trachomatis Nucleic Acid 09/17/2014 Negative  NRN Final    Comment: Testing methodology: Nucleic acid amplification by Aptima TMA   (transcription-mediated amplification).     . N.Gonorrhoeae(GC) Nucleic Acid 09/17/2014 Negative  NRN Final    Comment: Testing methodology: Nucleic acid amplification by [employer]ptima TMA   (transcription-mediated amplification).     Office Visit on 09/11/2014   Component Date Value Ref Range Status   . Odor 09/12/2014 ABSENT   Final   . WBC 09/12/2014 2+   Final   . RBC, Wet Prep 09/12/2014 NEGATIVE   Final   . Bacteria 09/12/2014 2+   Final   . Clue Cells 09/12/2014 NEGATIVE   Final   . Yeast, Wet Prep 09/12/2014 1+*  Final   . Epithelial Cells, Wet Prep 09/12/2014 2+  Final   . Trich 09/12/2014 NEGATIVE   Final   Office Visit on 09/10/2014   Component Date Value Ref Range Status   . Cholesterol (Total) 09/10/2014 159  <200 mg/dL Final   . Triglyceride 09/10/2014 42  <150 mg/dL Final   . Cholesterol (HDL) 09/10/2014 51  >39 mg/dL Final   . Cholesterol (LDL) 09/10/2014 100  <130 mg/dL Final   . Non-HDL Cholesterol 09/10/2014 108  0 - 159 mg/dL Final   . Cholesterol/HDL Ratio 09/10/2014 3.1   Final   . Lipid Panel, Additional Info. 09/10/2014 (NOTE)   Final    Comment: For complete information to help interpret the lipid panel, please   use the National Cholesterol Education Program (NCEP) guideline based   reference range comments found here:  http://web.labmed.TelephoneAid.tn     . Glucose 09/10/2014 89  62 - 125 mg/dL Final   . Thyroid Stimulating Hormone 09/10/2014 2.295  0.400 - 5.000 uIU/mL Final        IMAGING  US Pelvis Complete W Transvag    10/28/2014   Indication ========   SIGNS AND SYMPTOMS: lower pelvic cramping and pain.   Method ======   Transabdominal and transvaginal ultrasound  examination was performed to  evaluate pelvic structures. Satisfactory.   Uterus ======   Uterus details: Normal in size and shape Uterus position: anteverted Endometrium: Appropriate endometrial thickness Uterus long 5.4 cm Uterus ap 4.5 cm Uterus tr 3.3 cm Uterus vol 42.0 cm cubed Endometrial thickness, total 4.3 mm Fibroids: No fibroids identified Polyps: No polyps identified   Right Ovary ==========   Rt ovary details: Appears normal Rt ovary D1 3.6 cm Rt ovary D2 2.3 cm Rt ovary mean 3.0 cm Rt ovarian cyst(s): No cysts identified   Left Ovary =========   Lt ovary details: Appears normal Lt ovary D1 1.3 cm Lt ovary D2 2.1 cm Lt ovary D3 3.0 cm Lt ovary mean 2.1 cm Lt ovary vol 4.3 cm cubed Lt ovarian cyst(s): No cysts identified   Cul de Sac =========   No free fluid visualized.   Impression =========   Normal pelvic ultrasound. 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  Other diagnostic considerations would include functional upper GI disease and gastroesophageal reflux disease.  2.  Right upper quadrant pain which is unrelated to other GI symptoms is most likely musculoskeletal in nature  3.  Chronic constipation is likely secondary to irritable bowel syndrome and possible food intolerances  Plan:1 Discussed differential diagnosis and reviewed all of recent workup  2.  Recommended a modified FODMAPs diet and herbal teas with prokinetic effect  3.  Start magnesium oxide 400-800 mg at bedtime  4.  Trial of heating pad, muscle sprain liniments and avoiding movements it increase the right upper quadrant  5.  Decrease Prevacid to one a day  6.  Follow-up in 4 weeks for reevaluation    Thank you Dr  Lynnell Dike   ,for referring this patient to me.I will proceed with plan as outlined above and  keep you informed about results and further recommendations/follow up.Please feel free to contact me if you have any questions or concerns about this patient at [phone] .

## 2014-10-30 NOTE — Telephone Encounter (Signed)
Referral processed, patient will be contacted by SW

## 2014-10-31 ENCOUNTER — Encounter (INDEPENDENT_AMBULATORY_CARE_PROVIDER_SITE_OTHER): Payer: Self-pay | Admitting: Gastroenterology

## 2014-10-31 ENCOUNTER — Encounter (INDEPENDENT_AMBULATORY_CARE_PROVIDER_SITE_OTHER): Payer: Self-pay | Admitting: Clinical

## 2014-10-31 ENCOUNTER — Ambulatory Visit (INDEPENDENT_AMBULATORY_CARE_PROVIDER_SITE_OTHER): Payer: No Typology Code available for payment source | Admitting: Clinical

## 2014-10-31 ENCOUNTER — Encounter (HOSPITAL_BASED_OUTPATIENT_CLINIC_OR_DEPARTMENT_OTHER): Payer: Self-pay | Admitting: Obstetrics & Gynecology

## 2014-10-31 DIAGNOSIS — F419 Anxiety disorder, unspecified: Secondary | ICD-10-CM

## 2014-10-31 NOTE — Progress Notes (Signed)
SOCIAL WORK BRIEF ASSESSMENT    10/31/2014  Primary / referring provider: Dr. Lynnell DikeVierra    Brief description: Pt was referred to SW to discuss counseling for anxiety, and possible future psychiatry.      Patient description, presenting problem, patient / family goals:   Pt reports that her anxiety is "overwhleming" and "off the chart."  Pt states she feels like she "on a cliff, on the edge, waiting for a final push when I'm going to fall."  Pt states "I feel like I'm on the nerve of a mental break down."  Pt states she's "fed up with everything."  Pt denies any SI or HI.  Pt reports having some struggles with depression but states that "it's mainly anxiety."  Pt reports having been hospitalized many times when she didn't feel safe with herself, the first time when she was 11, and the most recent time in 2014.  Pt reports that there were times when she was hospitalized every 3 months, but was not admitted.  Pt states that, since meeting her bf Jorja Loaim in Jan 2015, she has not be SI concerns nor been hospitalized.  Pt reports [facility], and taking 5 medications for mental health reasons, but stopped both counseling and medications last Aug 2015 because her bf's mom was upset.  Pt states that there would be a concern if she started taking medications again, but wants to try counseling again to learn better coping mechanisms for her anxiety.  Pt reports that her [facility] therapist was an intern, which she didn't like, and that they didn't talk concrete enough about ways to deal with anxiety in the moment, for example talking a walk or something else to calm her.  Pt only recalls talking about her week with her therapist, and whether she had SI, and would create safety plans.    Pt explained that she has gotten a list of counseling providers in the area from her insurance company (not [facility]), but states she needs assistance finding any agency to establish on-going therapy that takes [facility].  Pt request referral information to be sent via eCare message.    Pt states she's still currently taking 100 mg of Trazodone to help with sleep, and reports this is going well, not negative side effects.  Pt states     Pt reports concerns that she upset Dr. Lynnell DikeVierra during her last appt, wants to confirm that there are no negative feelings.  Pt states that she would like to keep Dr. Lynnell DikeVierra as her PCP, that she wants to feel comfortable going to Dr. Lynnell DikeVierra if she has future concerns about her health.  Pt request SW discuss this with Dr. Lynnell DikeVierra and have Dr. Lynnell DikeVierra touch base with pt via eCare message.    Contacts: None    Impression: Pt is currently experience heightened anxiety symptoms, and has chronic anxiety and depression symptoms.  Pt denies any SI or HI.  Pt would benefit from on-going counseling to develop better coping mechanisms to use in the moment when feelings of heightened anxiety arise.      Intervention / Plan for follow-up: SW will send eCare message with referral information ([facility] and [facility]) and SW will f/u in a week to make sure pt was able to establish care.

## 2014-10-31 NOTE — Progress Notes (Signed)
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

## 2014-11-03 NOTE — Telephone Encounter (Signed)
Lvm relaying message below and that if she wants to come in sooner to discuss to call and schedule an appt with PCP or she can keep her appt with Dr. Laurell JosephsBurke on 4/27.

## 2014-11-05 ENCOUNTER — Ambulatory Visit (HOSPITAL_BASED_OUTPATIENT_CLINIC_OR_DEPARTMENT_OTHER)
Payer: No Typology Code available for payment source | Attending: Obstetrics/Gynecology | Admitting: Student in an Organized Health Care Education/Training Program

## 2014-11-05 ENCOUNTER — Encounter (HOSPITAL_BASED_OUTPATIENT_CLINIC_OR_DEPARTMENT_OTHER): Payer: Self-pay | Admitting: Obstetrics & Gynecology

## 2014-11-05 VITALS — BP 134/72 | HR 81 | Temp 97.9°F | Ht 63.0 in | Wt 229.9 lb

## 2014-11-05 DIAGNOSIS — Z30018 Encounter for initial prescription of other contraceptives: Secondary | ICD-10-CM | POA: Insufficient documentation

## 2014-11-05 DIAGNOSIS — B9689 Other specified bacterial agents as the cause of diseases classified elsewhere: Secondary | ICD-10-CM | POA: Insufficient documentation

## 2014-11-05 DIAGNOSIS — N760 Acute vaginitis: Secondary | ICD-10-CM | POA: Insufficient documentation

## 2014-11-05 DIAGNOSIS — B3731 Acute candidiasis of vulva and vagina: Secondary | ICD-10-CM

## 2014-11-05 DIAGNOSIS — N761 Subacute and chronic vaginitis: Secondary | ICD-10-CM | POA: Insufficient documentation

## 2014-11-05 DIAGNOSIS — B373 Candidiasis of vulva and vagina: Secondary | ICD-10-CM | POA: Insufficient documentation

## 2014-11-05 LAB — PR ALL POTASSIUM HYDROXIDE PREPARATIONS

## 2014-11-05 LAB — PR WET MOUNTS INCL PREP VAGINAL CERV/SKIN SPECIMENS, ONSITE
Bacteria: NEGATIVE
Clue Cells: NEGATIVE
Odor: NEGATIVE
RBC: NEGATIVE
Trich: NEGATIVE
WBC: NEGATIVE
Yeast, URN: NEGATIVE

## 2014-11-05 MED ORDER — BORIC ACID POWD
Status: DC
Start: 2014-11-05 — End: 2014-12-25

## 2014-11-05 MED ORDER — METRONIDAZOLE 500 MG OR TABS
500.0000 mg | ORAL_TABLET | Freq: Two times a day (BID) | ORAL | Status: DC
Start: 2014-11-05 — End: 2014-11-21

## 2014-11-05 MED ORDER — ETONOGESTREL-ETHINYL ESTRADIOL 0.12-0.015 MG/24HR VA RING
1.0000 | VAGINAL_RING | VAGINAL | Status: DC
Start: 2014-11-05 — End: 2014-12-25

## 2014-11-05 MED ORDER — FLUCONAZOLE 150 MG OR TABS
150.0000 mg | ORAL_TABLET | ORAL | Status: DC
Start: 2014-11-05 — End: 2015-02-07

## 2014-11-05 NOTE — Patient Instructions (Addendum)
Use Flagyl twice a day for 7 more days starting today  Use Fluconazole every 3 days for 3 doses starting today  Use Boric acid nightly starting now and extending 21 days after finishing flagyl (total 28 days), then move to twice a week    Start Nuvaring on 11/20/14    Come back to clinic in 6 weeks for follow-up        BORIC ACID    WHY USE IT?   Boric acid can be used as a treatment for or prevention of vaginal yeast infections. It works by stopping the growth of yeast and bacteria. Studies show that it can be up to 98% effective in reducing vaginal yeast infections. Boric acid can also help to eliminate atypical types of vaginal yeast.    WHAT ARE THE SIDE EFFECTS?   Side effects are minimal. The most common side effect is an increase in watery discharge.    IS IT EXPENSIVE?   Boric acid can be more cost effective than over-the-counter preparations.    HOW DO YOU USE THIS MEDICATION?   Boric acid in powder form is used to make capsules that are inserted in to your vagina.    WHERE CAN I BUY THIS MEDICATION?  You can call different pharmacies (especially "compounding pharmacies") to have these capsules made for you at an additional cost.   [facility] [address] in [place] has boric acid capsules already made up. Their phone number is [phone].    HOW CAN I MAKE THE CAPSULES MYSELF?  Most [facility] and [facility] pharmacies carry the supplies to make these capsules.  Use boric acid powder - not crystals.   Buy a bottle of "SIZE ZERO" gelatin capsules.  Fill the capsule with as much powder as it will hold.    WARNINGS/PRECAUTIONS:  . DO NOT TAKE THE CAPSULE ORALLY (BY MOUTH) AS BORIC ACID IS VERY TOXIC WHEN TAKEN BY MOUTH. Boric acid is NOT toxic to the vagina.   Marland Kitchen. KEEP THIS MEDICATION AWAY FROM ANIMALS AND CHILDREN.  . THIS MEDICATION SHOULD NOT BE USED IN PREGNANCY.  Marland Kitchen. AVOID ORAL TO GENITAL CONTACT FOR 24 HOURS AFTER USING THIS MEDICATION.  . DO NOT USE VAGINAL SPERMICIDES DURING  TREATMENT. LUBRICATED CONDOMS WITHOUT SPERMICIDE CAN BE USED WITH BORIC ACID.    _____ TO TREAT A CURRENT VAGINAL YEAST INFECTION:  Place one boric acid capsule in your vagina each night before going to bed for 2 weeks.    _____ TO PREVENT RECURRENT YEAST INFECTIONS:  Place boric acid capsules in your vagina two nights in one week (e.g. Monday and Thursday night) for 6 months.

## 2014-11-05 NOTE — Telephone Encounter (Signed)
Patient will keep appointment on 4/27. Closing TE.
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
## 2014-11-06 ENCOUNTER — Encounter (INDEPENDENT_AMBULATORY_CARE_PROVIDER_SITE_OTHER): Payer: No Typology Code available for payment source | Admitting: Gastroenterology

## 2014-11-06 NOTE — Progress Notes (Signed)
I saw and evaluated the patient. I have reviewed the resident's documentation and agree with it except she was advised to start Nuva Ring before her next depo-provera (not Nexplanon) dose is due..Marland Kitchen

## 2014-11-06 NOTE — Telephone Encounter (Signed)
See ecare and reply regarding flagyl/depo question

## 2014-11-06 NOTE — Telephone Encounter (Signed)
Replied to pt eCare     Please advise pt

## 2014-11-06 NOTE — Telephone Encounter (Signed)
Routing to Grace.

## 2014-11-07 ENCOUNTER — Encounter (HOSPITAL_BASED_OUTPATIENT_CLINIC_OR_DEPARTMENT_OTHER): Payer: Self-pay | Admitting: Obstetrics & Gynecology

## 2014-11-07 NOTE — Telephone Encounter (Signed)
Routing to Dr. Burke and PCP.

## 2014-11-08 LAB — R/O YEAST CULT W/DIRECT EXAM: Stain For Fungus: NONE SEEN

## 2014-11-10 ENCOUNTER — Encounter (INDEPENDENT_AMBULATORY_CARE_PROVIDER_SITE_OTHER): Payer: Self-pay | Admitting: Physician Assistant

## 2014-11-10 ENCOUNTER — Telehealth (INDEPENDENT_AMBULATORY_CARE_PROVIDER_SITE_OTHER): Payer: Self-pay | Admitting: Obstetrics & Gynecology

## 2014-11-10 ENCOUNTER — Ambulatory Visit (INDEPENDENT_AMBULATORY_CARE_PROVIDER_SITE_OTHER): Payer: No Typology Code available for payment source | Admitting: Physician Assistant

## 2014-11-10 VITALS — BP 129/80 | HR 84 | Temp 98.7°F | Resp 16 | Wt 228.2 lb

## 2014-11-10 DIAGNOSIS — Z3009 Encounter for other general counseling and advice on contraception: Secondary | ICD-10-CM

## 2014-11-10 NOTE — Telephone Encounter (Signed)
CONFIRMED PHONE NUMBER: [phone]   CALLERS FIRST AND LAST NAME: Janae BridgemanJessica Nicole Dorion  FACILITY NAME: n/a TITLE: n/a  CALLERS RELATIONSHIP:Self  RETURN CALL: Detailed message on voicemail only     [facility] only) Texting is an option for this clinic. If you would like us to use this option, which mobile phone number should we text to if we are unable to reach you?    SUBJECT: General Message   REASON FOR REQUEST: Schedule Nexplanon implantation    MESSAGE: Patient would like to know if she could do a Nexplanon implantation during her upcoming appointment with Dr. Laurell JosephsBurke (11/26/2014). If not she will do it with [facility]. If the procedure can be done, please be aware that patient's insurance will require a prior authorization. Patient has all information needed for prior auth and can give it to the clinic when she schedules.    Thank you!

## 2014-11-10 NOTE — Progress Notes (Signed)
Reason for visit: Contraception      Cervical screening/PAP:UTD    Have you seen a specialist since your last visit: NO     HM Due:   Health Maintenance   Topic Date Due   . HIV Screen  1992/10/24   . Pap Smear  08/30/2015   . Chlamydia Screen  09/18/2015   . Tetanus Vaccine  12/26/2022   . Influenza Vaccine  Completed   . HPV Vaccine  Completed

## 2014-11-10 NOTE — Progress Notes (Signed)
Kristen Salinas is a 24 year old female here with her s/o Kristen Salinas to discuss the following:    birth control  update on visit to specialist    She is due for her next Depo-Provera shot sometime between April 14 and 28th.  She believes that her anxiety, in part has been caused by the hormones in Depo-Provera.  For that reason she would like to pursue a different birth control.  She is specifically interested in having the Nexplanon reinserted.    She also wanted to let me know that she saw a vaginitis expert at [facility] to discussed her recurrent bacterial vaginosis.  She was advised to use vaginal boric acid supplements.  She was further advised that NuvaRing may help to change the pH or flora in her vagina that may also help with the recurrent BV.    She has an appointment with Dr. Laurell JosephsBurke in a few weeks.  She will find out if Dr. Laurell JosephsBurke can reinsert the Nexplanon at that visit.  If she is not able to get in with Dr. Laurell JosephsBurke for that procedure, I be happy to do it.  In the meantime we discussed that it was fine for her to go ahead and use NuvaRing.  This would give her an idea of whether or not it would be helpful for the bacterial vaginosis.      She continues to be very concerned about pregnancy and wants to make sure that she is using the most effective method possible.  Neither she nor her partner like condoms.    She also wanted to let me know that she has [facility].  She understands that some of her anxiety about pregnancy and birth control may not be rational;   and she would like to pursue counseling.      She is also hopeful that switching from Depo-Provera to  On will help to reduce the anxiety.            OBJECTIVE:  BP 129/80 mmHg  Pulse 84  Temp(Src) 98.7 F (37.1 C) (Temporal)  Resp 16  Wt 228 lb 3.2 oz (103.511 kg)  SpO2 100%  LMP 10/25/2014  CONSTITUTIONAL/GENERAL:  Appearance:  Well developed, appearing stated age and in no acute distress,   Facial features:     External ears and nose are normal in appearance without significant scars or asymmetry., Weight appears heavy for height, Gait and station:  Normal..  Judgement/insight:  Normal, Mood/affect:  Normal.        ASSESSMENT AND PLAN:  (Z30.09) Encounter for other general counseling or advice on contraception  (primary encounter diagnosis)  Plan: We discussed the information as described above.  This included a discussion of the potential for obesity to affect birth control.  We discussed the pros and cons of NuvaRing and nexplanon.    We also discussed her mental health, and I have endorsed her to efforts to seek counseling.    I spent a total time of 25 minutes face-to-face with the patient, of which more than 50% was spent counseling as outlined in this note.    Cletis AthensMichelle Lynne Bryam [place], [place]

## 2014-11-10 NOTE — Patient Instructions (Signed)
see Dr. Laurell JosephsBurke,    try nuvaring

## 2014-11-11 NOTE — Telephone Encounter (Signed)
Routing to Dr. Laurell JosephsBurke.     Pt would like Nexplanon implantation at her next appt. On 11/26/14. Please advise pt

## 2014-11-12 ENCOUNTER — Encounter (INDEPENDENT_AMBULATORY_CARE_PROVIDER_SITE_OTHER): Payer: Self-pay | Admitting: Obstetrics & Gynecology

## 2014-11-12 NOTE — Telephone Encounter (Signed)
Please call pt and tell her it should be fine for Dr.Burke to place nexplanon at her visit if that is what she wants and would recommend she start the PA process regardless - since she would need it regardless of the provider that places it.  Thanks

## 2014-11-12 NOTE — Telephone Encounter (Signed)
Pt had [facility] place a neva ring and has had that in for 2 days now and she heard it needs to be in for 3-4 weeks that taken out and the Nexplanon placed. Is this true and what is Dr. Salli RealBurks option? I told pt I would call as soon as I got feed back from Dr. Charlestine MassedBurk or Candis.      Pt was tolds that the doctor would have to start PA   [facility]  [phone]  Fax [phone]      Thank you Candis for all your help on all the TE is send to you

## 2014-11-12 NOTE — Telephone Encounter (Signed)
I'm happy to place the Nexplanon at the follow-up appointment.

## 2014-11-12 NOTE — Telephone Encounter (Signed)
[facility] - please ask her about placing nexplanon prior to her leaving clinic at 2:30pm.  She should be able to place nexplanon at visit.  Thank you.    Then please call patient to let her know to start PA process

## 2014-11-12 NOTE — Telephone Encounter (Signed)
Routing to Kristen Salinas

## 2014-11-12 NOTE — Telephone Encounter (Signed)
Patient called back to ask if Dr Laurell JosephsBurke will be able to complete the Nexplanon implantation during her upcoming appointment with Dr. Laurell JosephsBurke (11/26/2014? Patient needed to know by end of day today so she can start the process of getting prior authorization. If Dr Laurell JosephsBurke can not perform this procedure at 4/27 appointment patient has advised that she will just have pcp complete procedure.

## 2014-11-12 NOTE — Telephone Encounter (Signed)
I just got this so what next?

## 2014-11-13 ENCOUNTER — Ambulatory Visit (INDEPENDENT_AMBULATORY_CARE_PROVIDER_SITE_OTHER): Payer: No Typology Code available for payment source

## 2014-11-13 ENCOUNTER — Telehealth (INDEPENDENT_AMBULATORY_CARE_PROVIDER_SITE_OTHER): Payer: Self-pay | Admitting: Clinical

## 2014-11-13 ENCOUNTER — Encounter (HOSPITAL_BASED_OUTPATIENT_CLINIC_OR_DEPARTMENT_OTHER): Payer: Self-pay | Admitting: Obstetrics & Gynecology

## 2014-11-13 NOTE — Telephone Encounter (Signed)
Pt had [facility] intake on 4/13, SW calling to f/u.  SW left voicemail with direct call back number.  SW will call back next week to f/u.

## 2014-11-13 NOTE — Telephone Encounter (Signed)
SOCIAL WORK FOLLOW-UP NOTE    11/13/2014  Brief description: [facility].    Patient description, presenting problem, patient / family goals: Pt reports that 3/13 went well, a lot of paperwork, 2 hour assessment (no diagnosis, R/O OCD related to anxiety about being pregnant).  Pt scheduled appt next Wed with female therapist specialized in trauma work and psychanalysis therapy.  Pt reports that she's able to switch therapists once a calendar year; has hesitations working with a female provider.  Pt reports that there intake therapist "had to dig into my past, had to talk about that, which was not great."      Pt reports that she saw Sharyn CreamerMichelle Vierra on Monday to talk about the next birth control option, that she'd like to get off the depo shot which is causing her to have more anxiety.  Pt reports previously being on the NEXPLANON implant in the past, after which she "wasn't concerned about pregnancy for once in my life."  Pt reports having an appt on the 27th with Dr. Laurell [facility]sBurke at [facility] to [facility] the implant and will ask her to give her a pregnancy test before implantation just in case.  Pt reports being prescribed the Nuva Ring on the 6th, [facility].  Pt reports being worried that being on both the Depo shot and Nuva ring may be "too much hormone, could be a contributing factor" to her anxiety.   Pt reports that she was advised to put in the Nuva ring a week before the Depo shot was to expire at the end of the month, but that she put it in early.     Pt reports being in a current "paranoia stage of freaking out that I could be pregnant... It comes and goes."  Pt reports that the last time she had this state, it took her 6 weeks to "finally get it through my head" that she wasn't pregnant.  Pt reports having an anxiety attack yesterday after her app[facility].  Pt reports that she was in [employer] with her bg, "didn't want to go in there in the first  place... Couldn't deal... Felt really frustrated, felt angry, felt like I was going to scream in anger or start crying... A lot of people there... Had to get out, couldn't deal, felt like I was going to start crying."      Pt reports being "able to manage until Wednesday" for her therapy appt.  Pt feels comfortable calling the crisis line or getting herself to the ED if in crisis.  Pt denies any SI, "I don't feel suicidal at all."  Pt states "I can't control the anxiety."      Intervention / Outcome / Plan: Pt will message Sharyn CreamerMichelle Vierra to ask about the Nuva ring.  SW will call pt in 2-3 weeks to f/u.

## 2014-11-13 NOTE — Telephone Encounter (Signed)
Spoke to pt and let her know the message below. She is going to keep the Neva ring in until her appt. CLOSING TE

## 2014-11-14 NOTE — Telephone Encounter (Signed)
Routing to [employer]. Please advise pt

## 2014-11-16 ENCOUNTER — Encounter (HOSPITAL_BASED_OUTPATIENT_CLINIC_OR_DEPARTMENT_OTHER): Payer: Self-pay | Admitting: Obstetrics & Gynecology

## 2014-11-17 ENCOUNTER — Telehealth (INDEPENDENT_AMBULATORY_CARE_PROVIDER_SITE_OTHER): Payer: Self-pay | Admitting: Obstetrics & Gynecology

## 2014-11-17 NOTE — Telephone Encounter (Signed)
Scheduled with Dr.Irwin tomorrow for vaginitis eval

## 2014-11-17 NOTE — Telephone Encounter (Signed)
Pt removed nuvaring - does not need PA anymore for this - closing TE

## 2014-11-17 NOTE — Telephone Encounter (Signed)
Dr. Laurell JosephsBurke, this patient is scheduled to See Dr. Francesca JewettIrwin tomorrow morning. She wanted to know if she should cancel this appointment and just see [facility]... I am not sure what to tell her.     Also, patient wants to know if prior auth was done for her nexplanon placement. Please advise

## 2014-11-17 NOTE — Telephone Encounter (Signed)
CONFIRMED PHONE NUMBER: [phone]  CALLERS FIRST AND LAST NAME: Janae BridgemanJessica Nicole Klatt  FACILITY NAME: n/a TITLE: n/a  CALLERS RELATIONSHIP:Self  RETURN CALL: Detailed message on voicemail only     [facility] only) Texting is an option for this clinic. If you would like us to use this option, which mobile phone number should we text to if we are unable to reach you?    SUBJECT: General Message   REASON FOR REQUEST: Update for Kira    MESSAGE: Patient states that she was working with referral coordinator Morrell RiddleKira at [facility] earlier and would like to let her know that she was able to get an appointment at [facility] on Wednesday. No call back needed unless clinic has questions or concerns. Thank you!

## 2014-11-17 NOTE — Telephone Encounter (Signed)
Called pt to discuss her concerns.    She is still having vaginitis symptoms and hoped sx would improve after removing nuva ring.   She had questions about possible pregnancy after intercourse on April 6th - within the 10 weeks after depo provera injection. Pt states partner did not ejaculate.   She is worried.  States she is stressed right now but does have some nausea and her right breast was very tender to the touch a couple weeks ago.  Advised pt take a pregnancy test now if she is concerned.  If negative retake later in the week.  Reassured her that low likelihood of pregnancy given depo and withdrawal method.    Pt would like to be seen sooner than next week to check on vaginal symptoms and does want a nexplanon placed but is okay waiting until Dr.Burke can place it next week.      No further action needed at this time.  Routing to provider as Lorain ChildesFYI

## 2014-11-18 ENCOUNTER — Encounter (INDEPENDENT_AMBULATORY_CARE_PROVIDER_SITE_OTHER): Payer: No Typology Code available for payment source | Admitting: Obstetrics & Gynecology

## 2014-11-18 NOTE — Telephone Encounter (Signed)
Routing to referral pool

## 2014-11-18 NOTE — Telephone Encounter (Signed)
LVM for patient following up. INformed patient if she has not called and scheduled with womens health she can call [facility] and they can schedule her. Closing TE

## 2014-11-18 NOTE — Telephone Encounter (Signed)
No further action needed, closing TE

## 2014-11-19 ENCOUNTER — Ambulatory Visit (HOSPITAL_BASED_OUTPATIENT_CLINIC_OR_DEPARTMENT_OTHER)
Payer: No Typology Code available for payment source | Attending: Obstetrics/Gynecology | Admitting: Student in an Organized Health Care Education/Training Program

## 2014-11-19 VITALS — BP 149/79 | HR 90 | Temp 98.8°F | Ht 63.0 in | Wt 223.1 lb

## 2014-11-19 DIAGNOSIS — B3731 Acute candidiasis of vulva and vagina: Secondary | ICD-10-CM

## 2014-11-19 DIAGNOSIS — B373 Candidiasis of vulva and vagina: Secondary | ICD-10-CM

## 2014-11-19 DIAGNOSIS — Z30018 Encounter for initial prescription of other contraceptives: Secondary | ICD-10-CM

## 2014-11-19 DIAGNOSIS — Z3049 Encounter for surveillance of other contraceptives: Secondary | ICD-10-CM

## 2014-11-19 DIAGNOSIS — R300 Dysuria: Secondary | ICD-10-CM

## 2014-11-19 LAB — PR WET MOUNTS INCL PREP VAGINAL CERV/SKIN SPECIMENS, ONSITE
Clue Cells: NEGATIVE
Odor: NEGATIVE
RBC: NEGATIVE
Trich: NEGATIVE
Yeast, URN: NEGATIVE

## 2014-11-19 LAB — PR U/A NONAUTO DIPSTICK ONLY, ONSITE
Bilirubin (Indirect): NEGATIVE
Glucose, Urine: NEGATIVE mg/dL
Ketones, URN: 40 mg/dL
Nitrite, URN: NEGATIVE
Protein: NEGATIVE mg/dL
Urobilinogen, URN: 0.2 E.U./dL (ref 0.2–1.0)
pH, URN (UWNC): 6 (ref 5.0–8.0)

## 2014-11-19 LAB — PR ALL POTASSIUM HYDROXIDE PREPARATIONS: Hyphae: NEGATIVE

## 2014-11-19 NOTE — Patient Instructions (Signed)
Patient Information  PLACEMENT OF ETONOGESTREL IMPLANT (NEXPLANON)    Nature and Character of the Proposed Treatment:   Nexplanon is a small plastic rod that is inserted under the skin of the arm.   It is a very effective form of birth control.   It contains the hormone etonogestrel, a progestin, which is released in a small, continuous dose for up to 3 years.  It must be removed by the end of the third year.   It works primarily by suppressing ovulation.    Anticipated Results:   The pregnancy rate (the failure rate) for Nexplanon is less than .5 pregnancies per 100 users.   Most women have changes in their menstrual bleeding while using Nexplanon: your menstrual bleeding may be irregular, lighter or heavier, or it may completely stop.   Nexplanon does not offer protection from STDs (sexually transmitted diseases).    Benefits of Nexplanon:   Highly effective contraception.   Privacy; long-term birth control; cost effective; option for women who cannot or should not take estrogen, or who have trouble taking daily birth control pills.   Does not completely suppress ovarian function; estrogen continues to be produced.    Possible Side Effects:    There is some discomfort/pain during the insertion procedure; this is minimized by use of local anesthetic.   There is a slight risk of getting a scar from the insertion.   Rarely there is a small bruise at the insertion site.    Possible Complications:   There is a slight risk of infection at the insertion site.   There is a very small risk of reacting to the local anesthetic.    Consequences of Non-treatment or Postponing:   Pregnancy, unless another effective birth control method is used.    Alternatives:   Abstinence.   IUDs: Mirena (progestin-based) or ParaGard (copper-based).   Hormonal methods of birth control: oral combination pills, progestin-only pills, vaginal ring (Nuvaring), patch (Ortho Evra), or Depo-Provera.   Barrier methods:  condoms/spermicides, diaphragm, cervical cap.   Permanent sterilization: tubal ligation, vasectomy (female).   Natural Family Planning.

## 2014-11-19 NOTE — Progress Notes (Signed)
I saw and evaluated the patient. I have reviewed the resident's documentation and agree with it.    ---------------------------------------------    Procedure:  Nexplanon insertion.  The procedure risks and benefits were discussed with the patient.  Written consent obtained.  The procedure took 5 minutes or less to perform.  I was personally present for the entire procedure.    ---------------------------------------------
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
## 2014-11-20 ENCOUNTER — Encounter (HOSPITAL_BASED_OUTPATIENT_CLINIC_OR_DEPARTMENT_OTHER): Payer: Self-pay | Admitting: Obstetrics & Gynecology

## 2014-11-20 NOTE — Telephone Encounter (Signed)
Routing to Dr. Burke and ob/gyn nurse

## 2014-11-21 ENCOUNTER — Ambulatory Visit (INDEPENDENT_AMBULATORY_CARE_PROVIDER_SITE_OTHER): Payer: No Typology Code available for payment source | Admitting: Family Medicine

## 2014-11-21 ENCOUNTER — Telehealth (INDEPENDENT_AMBULATORY_CARE_PROVIDER_SITE_OTHER): Payer: Self-pay | Admitting: Family Medicine

## 2014-11-21 ENCOUNTER — Telehealth (HOSPITAL_BASED_OUTPATIENT_CLINIC_OR_DEPARTMENT_OTHER): Payer: Self-pay | Admitting: Obstetrics/Gynecology

## 2014-11-21 ENCOUNTER — Encounter (INDEPENDENT_AMBULATORY_CARE_PROVIDER_SITE_OTHER): Payer: Self-pay | Admitting: Family Medicine

## 2014-11-21 VITALS — BP 132/83 | HR 106 | Temp 98.6°F | Resp 14 | Wt 224.0 lb

## 2014-11-21 DIAGNOSIS — R300 Dysuria: Secondary | ICD-10-CM

## 2014-11-21 DIAGNOSIS — Z3202 Encounter for pregnancy test, result negative: Secondary | ICD-10-CM

## 2014-11-21 DIAGNOSIS — N761 Subacute and chronic vaginitis: Secondary | ICD-10-CM

## 2014-11-21 LAB — URINE C/S: Culture: NO GROWTH

## 2014-11-21 LAB — PR URINE PREGNANCY TEST HCG, ONSITE: Pregnancy (HCG) (UWNC), URN: NEGATIVE

## 2014-11-21 LAB — PR U/A AUTO DIPSTICK ONLY, ONSITE
Bilirubin, Urine: NEGATIVE
Glucose, Urine: NEGATIVE mg/dL
Ketones, URN: NEGATIVE mg/dL
Leukocytes: NEGATIVE
Nitrite, URN: NEGATIVE
Occult Blood, URN: NEGATIVE
Protein: NEGATIVE mg/dL
Specific Gravity, Urine: 1.02 (ref 1.005–1.030)
Urobilinogen, URN: 0.2 E.U./dL (ref 0.2–1.0)
pH, URN: 5.5 (ref 5.0–8.0)

## 2014-11-21 LAB — PREGNANCY (HCG), SERUM, QUANT: Pregnancy (HCG), SRM: 1 m[IU]/mL (ref <6)

## 2014-11-21 MED ORDER — METRONIDAZOLE 500 MG OR TABS
500.0000 mg | ORAL_TABLET | Freq: Two times a day (BID) | ORAL | Status: DC
Start: 2014-11-21 — End: 2014-12-25

## 2014-11-21 NOTE — Patient Instructions (Addendum)
It was a pleasure to see you in clinic today.                  If you are not yet signed up for eCare, please speak with a team member at the front desk who would happy to assist you with this process.      eCare  enrollment will allow you access to the below benefits   You can make appointments online   View test results / Lab Results   Obtain a copy of our After Visit Summary (a summary of your visit today)     Your Test Results:  If labs were ordered today the results are expected to be available via eCare in about 5 days. If you have an active eCare account, this is how we will notify you of your results.     If you do not have an eCare account then your test results will be mailed to you within about 14 days after your tests are completed. If your physician needs to change your care based on your results or is concerned, you will receive a phone call.     If you have any questions about your test results please schedule an appointment with your provider.    **If it has been more than 2 weeks and you have not received your test results please send our office a message via eCare.    Medication Refills: If you need a prescription refilled, please contact your pharmacy 1 week before your current supply will run out to request the refill.  Contacting your pharmacy is the fastest and safest way to obtain a medication refill.  The pharmacy will notify our office.  Please note, that a minimum of 48 to 72 hours is needed to refill a medication, but refills are usually processed and sent to your pharmacy in about 5 business days.  Please call your pharmacy early to allow enough time to refill before you anticipate running out.  For faster medication refills, you can also schedule an appointment with your provider.    We know you have a choice in where you receive your healthcare and we sincerely thank you for trusting [facility] with your health.      Push fluids    Avoid spicy and acidic  foods  Also caffeine and alcohol    Schedule with Urogyn  Also follow up with Gyn    Finish boric acid suppositories  Take Flagyl course - ok to check with Gyn first

## 2014-11-21 NOTE — Telephone Encounter (Signed)
Patient called me to ask whether she should take the Flagyl that another provider prescribed this am for vaginal irritation.  Pt was seen by me two days ago.  AT that time, she did not have BV, and did not have a urine infection.  Is on the daily portion of  Boric Acid to prevent BV yeast. (the regimen in chart of po flagyl, boric acid, then twice weekly flagyl).  Also, states when takes po Flagyl, gets yeast, so Boric acid will help    Pt states she was also evaluated for a UTI today and was told it was negative.  Feels irritation when urine comes out and hits her skin- recommended she use Aquafor to protect her vulvar skin. Also, suggested Boric Acid daily can sometimes cause some burning .  Pt does not think it is causing her symptoms.    Pt sees multiple providers very frequently and contacts multiple providers very frequently.  I suggested that it was hard for me to second guess another provider her examined her today.  Encouraged her to stick with one provider - and if [facility] is too far that is okay.      Encouraged heating pad/ibuprofen for cramping that she is having.    Pt has f/u appointment next week scheduled already.

## 2014-11-21 NOTE — Telephone Encounter (Signed)
CONFIRMED PHONE NUMBER: [phone]  CALLERS FIRST AND LAST NAME: Kristen BridgemanJessica Nicole Salinas  FACILITY NAME: na TITLE: na  CALLERS RELATIONSHIP:Self  RETURN CALL: Detailed message on voicemail only     [facility] ONLY) Texting is an option for this clinic. If you would like us to use this option, which mobile phone number should we text to if we are unable to reach you?    SUBJECT: General Message   REASON FOR REQUEST: Patient will like a call as soon as her lab results are in, she had blood work for pregnancy today 11/21/2014. Please advise, thank you.    MESSAGE: see reason for request.

## 2014-11-21 NOTE — Telephone Encounter (Signed)
Patient is seeing Dr. Craige CottaKirby as a new patient at [facility] on [facility].  States that she is having abdominal bloating and urinary burning. [facility], had a UA completed, and was rx Flagyl. Pt was unsure if she has an UTI. Patient was wondering if she should take medication and what to do for abd discomfort.  Advised patient to call [facility] to discuss her medication treatment plan since patient has not been seen by Dr. Craige CottaKirby yet.  Advised patient that Ibuprofen and warming pad would help with discomfort.  Patient stated she would call [facility] to discuss her treatment plan with them.  Denies any other concerns.  Patient has new patient appt with Dr. Craige CottaKirby on Monday.  TC encounter closed.     Anastasia FiedlerKendra Mellanie Bejarano, RN BSN  [facility]   ------------------------------------------------------------------------------------------------

## 2014-11-21 NOTE — Telephone Encounter (Signed)
Situation: Kristen BridgemanJessica Nicole Salinas called.   Background: Patient will be seen with Dr. Craige CottaKirby at 11/24/14.  Assessment: Patient is wanting to speak to a RN concerning a medication question.   Request: Please call patient at [phone].

## 2014-11-21 NOTE — Telephone Encounter (Signed)
Notified patient that test is not final yet. Postponing to Monday to check status of result.

## 2014-11-21 NOTE — Telephone Encounter (Signed)
CONFIRMED PHONE NUMBER: [phone]  CALLERS FIRST AND LAST NAME: Kristen BridgemanJessica Nicole Salinas    FACILITY NAME: na TITLE: na  CALLERS RELATIONSHIP:Self  RETURN CALL: Detailed message on voicemail only     [facility] ONLY) Texting is an option for this clinic. If you would like us to use this option, which mobile phone number should we text to if we are unable to reach you?    SUBJECT: Medication Management   REASON FOR REQUEST: Medication Management    MEDICATION(S): MetroNIDAZOLE (FLAGYL) 500 MG Oral Tab  MEDICATION(S)  NEEDED BY: N/A  ADDITIONAL INFORMATION: Patient was prescribed the above medication by [facility]ion [facility]Sandra [facility] today for vaginal irritation and burning. Shanda BumpsJessica wants to know if Dr. Dolphus JennyEckert agrees with prescribing this medication. Please call to discuss, thank you.

## 2014-11-21 NOTE — Progress Notes (Signed)
Reason for visit: bladder pain   Reviewed eCare status with Patient:  NO    HEALTH MAINTENANCE:  Has the patient had any of these since their last visit?    Cervical screening/PAP: UTD     Mammo: N/A    Colon Screen: N/A    Have you seen a specialist since your last visit: [facility] 11/19/14  Dr Caesar BookmanEckhart        HM Due:   Health Maintenance   Topic Date Due   . HIV Screen  04/22/93   . Pap Smear  08/30/2015   . Chlamydia Screen  09/18/2015   . Tetanus Vaccine  12/26/2022   . Influenza Vaccine  Completed   . HPV Vaccine  Completed           Future Appointments  Date Time Provider Department Center   11/26/2014 12:30 PM Eppie GibsonBurke, Alson K, MD [facility]   11/28/2014 2:30 PM Yates DecampKrishnamurthy, Shoba, MBBS [facility]   12/17/2014 1:00 PM Soundra Pilonhen, Tiffany Ann, MD [facility]

## 2014-11-21 NOTE — Telephone Encounter (Signed)
Hi Dr. Dolphus JennyEckert,    This patient is calling asking you for a second opinion. I thought I would forward this message directly along to you. Please let me know if you need my assistance with anything.     -Denny PeonErin

## 2014-11-22 LAB — R/O YEAST CULT W/DIRECT EXAM: Stain For Fungus: NONE SEEN

## 2014-11-23 ENCOUNTER — Ambulatory Visit: Payer: Self-pay

## 2014-11-23 ENCOUNTER — Encounter (INDEPENDENT_AMBULATORY_CARE_PROVIDER_SITE_OTHER): Payer: Self-pay | Admitting: Family Medicine

## 2014-11-23 LAB — URINE C/S: Colony Count: 100

## 2014-11-23 NOTE — Telephone Encounter (Signed)
-----   Message from Seleta RhymesIrish Joy E Layador sent at 11/23/2014  2:17 PM PDT -----  Regarding: Medication questions - boric acid vaginal suppository   >> IRISH JOY E LAYADOR 11/23/2014 02:17 PM  Medication questions - boric acid vaginal suppository

## 2014-11-23 NOTE — Telephone Encounter (Signed)
Protocol: ABDOMINAL PAIN - FEMALE-ADULT-AH  Negative: Shock suspected (e.g., cold/pale/clammy skin, too weak to stand, low BP, rapid pulse)  Negative: Difficult to awaken or acting confused  (e.g., disoriented, slurred speech)  Negative: Passed out (i.e., lost consciousness, collapsed and was not responding)  Negative: Sounds like a life-threatening emergency to the triager  Negative: Chest pain  Negative: Pain is mainly in upper abdomen  (if needed ask: "is it mainly above the belly button?")  Negative: Followed an abdomen (stomach) injury  Negative: [1] Abdominal pain AND [2] pregnant < 20 weeks  Negative: [1] Abdominal pain AND [2] pregnant > 20 weeks  Negative: [1] Abdominal pain AND [2] postpartum < 1 month since delivery  Affirmative: [1] SEVERE pain (e.g., excruciating) AND [2] present > 1 hour  Disposition of Go to ED Now suggested.

## 2014-11-23 NOTE — Telephone Encounter (Signed)
Patient using boric acid vaginal suppositories for vaginal candidias.  Patient c/o burning inside the vagina.  Patient states she feels like she has vaginitis. Denies vaginal discharge. Flagyl was prescribed on Friday for vaginitis and has not started taking it.  Patient rates pain @ 8-9 on scale of 1/10.  Patient states it is pelvic pain.  Pelvic pain is constant, since 0800.  Denies n/v/d fever.

## 2014-11-24 ENCOUNTER — Encounter (INDEPENDENT_AMBULATORY_CARE_PROVIDER_SITE_OTHER): Payer: Self-pay | Admitting: Obstetrics/Gynecology

## 2014-11-24 ENCOUNTER — Ambulatory Visit: Payer: No Typology Code available for payment source | Admitting: Obstetrics/Gynecology

## 2014-11-24 ENCOUNTER — Telehealth (INDEPENDENT_AMBULATORY_CARE_PROVIDER_SITE_OTHER): Payer: Self-pay | Admitting: Obstetrics & Gynecology

## 2014-11-24 ENCOUNTER — Telehealth (INDEPENDENT_AMBULATORY_CARE_PROVIDER_SITE_OTHER): Payer: Self-pay | Admitting: Physician Assistant

## 2014-11-24 ENCOUNTER — Telehealth (INDEPENDENT_AMBULATORY_CARE_PROVIDER_SITE_OTHER): Payer: Self-pay | Admitting: Family Medicine

## 2014-11-24 VITALS — BP 129/83 | HR 100 | Resp 16 | Ht 62.99 in | Wt 224.0 lb

## 2014-11-24 DIAGNOSIS — R300 Dysuria: Secondary | ICD-10-CM

## 2014-11-24 DIAGNOSIS — R102 Pelvic and perineal pain: Secondary | ICD-10-CM

## 2014-11-24 DIAGNOSIS — M62838 Other muscle spasm: Secondary | ICD-10-CM

## 2014-11-24 DIAGNOSIS — R3911 Hesitancy of micturition: Secondary | ICD-10-CM

## 2014-11-24 DIAGNOSIS — R339 Retention of urine, unspecified: Secondary | ICD-10-CM

## 2014-11-24 LAB — PR MEAS POST-VOIDING RESIDUAL URINE&/BLADDER CAP: Volume, URN: 0

## 2014-11-24 LAB — PR COMPLEX UROFLOMETRY
Other: 10.4
Other: 16.3
Other: 16.7
Other: 18
Other: 4.8
Urine Volume: 174

## 2014-11-24 NOTE — Telephone Encounter (Signed)
Duplicate TE. Closing TE. Routing to Dr. Dareen PianoAnderson as Lorain ChildesFYI.    Please review 4/22 labs, 4/24 patient email, and 4/25 TE's.

## 2014-11-24 NOTE — Telephone Encounter (Signed)
Please see Nurse Triage report  Reason for Call: abdominal pain  Recommended Disposition: See Within 3 Days in Office  Action: [facility] by Dr. Dareen PianoAnderson for this incident. Closing TE

## 2014-11-24 NOTE — Telephone Encounter (Signed)
Wants to go over her results from 4/22 [facility] test [facility].  Nelly RoutSays Anderson authorized. Please call.  [phone], okay to leave a detailed vm.       Thank you

## 2014-11-24 NOTE — Progress Notes (Signed)
Kristen Salinas is a 24 year old female.    Chief Complaint   Patient presents with   . Bladder Problems     vaginal pain x 1 week and pelvic bloating x 4 days        HPI:  Pt here for pelvic pain x 1 week, also bloating in last 4 days  Has pain with urination and sometime hesitancy, voiding small amount after strong urge to go  Worries could be pregnant    Pt also has breast tenderness B L>R, nausea, bloating, back pain    Pt has h/o vaginitis, recurent, with normal KOH  Gets vaginitis symptoms then urinary symptoms of dysuria, frequency, hesitancy and abd cramping  Used to get oral Flagyl or diflucan for symptoms  Flagyl seemed to help the most    Has been seeing Gyn specialist at [facility] for this  Currently using Boric Acid suppositories for 10 day course  Concern that her vaginal flora is very disrupted    Increased discharge, but not cloudy, malodorous or clumpy    Pt reports she was on Depo, shot 3 mo ago  After Nexplanon removed    1 weeks ago had withdrawal bleeding after using Nuvaring 4/6-18    Nexplanon was replaced this past weds 4/20  Has also been seen at [facility]    Pt here with husband.  PCP = Sharyn Creamer    The above HPI includes the following elements:  context, duration, quality, severity, modifying factors and associated signs and symptoms    ROS:   Constitutional: Positive for fatigue   Eyes: Negative    Ears, Nose, Mouth, Throat: Negative    Cardiovascular: Negative    Respiratory: Negative    Gastrointestinal: As noted in HPI above   Genitourinary: As noted in HPI above   Musculoskeletal: Negative    Skin: Negative    Neurological: Negative    Psychiatric: Negative    Endocrine: Negative    Hematologic/Lymphatic: Negative   Allergic/Immunologic: Negative     Past Medical History   Diagnosis Date   . Obesity    . Pap smear abnormality of cervix    . Heart burn    . Chest pain    . History of irritable bowel syndrome    . Frequent use of laxatives    . GERD (gastroesophageal reflux  disease)    . Indigestion    . Nausea and vomiting      Past Surgical History   Procedure Laterality Date   . No prior surgeries     . Colonoscopy stoma dx including collj spec spx     . Esophagogastroduodenoscopy       Review of patient's allergies indicates:  Allergies   Allergen Reactions   . Prednisone Rash     Meds:   Outpatient Prescriptions Prior to Visit   Medication Sig Dispense Refill   . Boric Acid Does not apply Powder 600 mg in "O" gelatin capsule. Place in vagina nightly for 28 days, then twice a week 2000 g 1   . CRANBERRY OR      . Etonogestrel 68 MG Subcutaneous Implant 1 each (68 mg) by Subdermal route One time.     . Etonogestrel-Ethinyl Estradiol (NUVARING) 0.12-0.015 MG/24HR Vaginal RING Place 1 ring into the vagina every 28 days. Leave in place for 21 days, then remove for 1 week, and then replace with new ring. 13 ring 0   . Fluconazole 150 MG Oral Tab Take  1 tablet (150 mg) by mouth every 3 days. 3 tablet 0   . Lansoprazole (PREVACID OR) 20 mg twice a day     . Magnesium Oxide 400 MG Oral Tab Take 2 tablets (800 mg) by mouth daily. 60 tablet 4   . MedroxyPROGESTERone Acetate (DEPO-PROVERA IM)      . MetroNIDAZOLE (FLAGYL) 500 MG Oral Tab Take 1 tablet (500 mg) by mouth 2 times a day. 14 tablet 0   . Omeprazole 20 MG Oral CAPSULE DELAYED RELEASE      . Ondansetron 4 MG Oral TABLET DISPERSIBLE      . Probiotic Product (SOLUBLE FIBER/PROBIOTICS OR)      . Psyllium (METAMUCIL OR)      . TraZODone HCl 100 MG Oral Tab Take 1.5 tablets (150 mg) by mouth at bedtime. For Insomnia. 90 tablet 3     No facility-administered medications prior to visit.         Family History   Problem Relation Age of Onset   . Family history unknown: Yes       History     Social History   . Marital Status: Single     Spouse Name: N/A   . Number of Children: N/A   . Years of Education: N/A     Occupational History   . Not on file.     Social History Main Topics   . Smoking status: Former Smoker -- 0.50 packs/day for 2  years     Types: Cigarettes   . Smokeless tobacco: Never Used   . Alcohol Use: Yes      Comment: Rare   . Drug Use: No   . Sexual Activity: No      Comment: didn't tolerate Nexplanon after one year.      Other Topics Concern   . Not on file     Social History Narrative       P.E:  BP 132/83 mmHg  Pulse 106  Temp(Src) 98.6 F (37 C) (Temporal)  Resp 14  Wt 224 lb (101.606 kg)  SpO2 97%  LMP 10/25/2014  General appearance*: Color is normal, Weight appears normal for height.  and No acute distress  Affect*: Judgement/insight normal, Mood/affect normal, Orientation oriented to time, person and place and Recent/remote Memory normal     HEENT: Eyes clear, anicteric                   O/P nl, MM moist      Neck: supple, no nodes, nl thyroid     Chest: CTA and P, no CVAT      COR: RRR, no murmur, rub or gallop, rad pulses 2+      Abd: benign, soft, diffusely tender B lower abdomen and over bladder w/o rebound or gaurding, no HSM or masses      Ext: w/d, no edema      Skin: no rashes     UA: negative  U HCG negative    A/P    (Z32.02) Pregnancy test negative  (primary encounter diagnosis)  Comment: Urine test neg, pt still worried about early pregnancy  Plan: URINE PREGNANCY TEST HCG, ONSITE, PREGNANCY         (HCG), SERUM, QUANT - ok to do blood pregnancy testing to reassure pt though unlikely since was on Depo 1/27 and also had Nuvaring and now Nexplanon            (R30.0) Dysuria  With pelvic pain  ? Interstitial  cystitis  Plan: U/A AUTO DIPSTICK ONLY, ONSITE, URINE C/S,         REFERRAL TO UROLOGY      (N76.1) Subacute vaginitis  H/o recurrently  Plan: MetroNIDAZOLE (FLAGYL) 500 MG Oral Tab - will try flagyl since pt reports this has been helpful for her in the past with similar symptoms.        MEDICAL DECISION MAKING:  # of diagnoses or management options: multiple (c)  Amount and/or complexity of data to be reviewed: limited (b)  Risk of complications and/or morbidity or mortality: moderate (c)    F/u:  prn

## 2014-11-24 NOTE — Telephone Encounter (Signed)
CONFIRMED PHONE NUMBER:[phone]  CALLERS FIRST AND LAST NAME: Joellyn RuedJessica Tassin   FACILITY NAME: TITLE:   CALLERS RELATIONSHIP:Self  RETURN CALL: Detailed message on voicemail only    Pt is calling in regards to the HCG test results. Pt states the nurse told her that the value went up from 0.10 to 1. Pt is worried that she had the HCG test to early and doesn't understand why it would go up. Pt states she is having pregnancy symptoms. Please advise pt of what to do.

## 2014-11-24 NOTE — Telephone Encounter (Signed)
Duplicate.    Closing this TE.

## 2014-11-24 NOTE — Telephone Encounter (Signed)
Please be advised that patient also called in this morning to report that she had two positive pregnancy tests - there is a TE.

## 2014-11-24 NOTE — Telephone Encounter (Signed)
CONFIRMED PHONE NUMBER: [phone]  CALLERS FIRST AND LAST NAME: Joellyn RuedJessica Halsted  FACILITY NAME: NA TITLE: NA  CALLERS RELATIONSHIP:Self  RETURN CALL: Detailed message on voicemail only    Patient states that she has had two positive home pregnancy tests today.     Patient is requesting to have her most recent labs released to her via eCare.

## 2014-11-24 NOTE — Telephone Encounter (Signed)
Please see patient reply with attachments.

## 2014-11-24 NOTE — Telephone Encounter (Signed)
Routing to [place]

## 2014-11-24 NOTE — Telephone Encounter (Signed)
Please see previous TE  We did inform her that pregnancy test was negative
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
## 2014-11-24 NOTE — Telephone Encounter (Signed)
Please contact provider who ordered this test. patient is contracepted with Depo Provera and Nexplanon currently. Very unlikely she is pregnant. 1 is a negative value, but I defer calling the patient, as I have not seen her recently and she has seen several other providers (both primary care and Gyn) since our last visit.

## 2014-11-24 NOTE — Patient Instructions (Signed)
Work with pelvic floor PT to relax pelvic floor muscles.  Consider trigger point injections if needed.    Try to void when you have a full bladder.    Avoid antibiotics unless you have an infection.    Consider lactobacillus capsules per vagina - nightly for a week and then weekly for 10 weeks to help restore vaginal flora.    Drop off a urine sample (not a clean catch) to check for mycoplasma, which has been known to cause urethral pain.

## 2014-11-24 NOTE — Telephone Encounter (Signed)
Routing to Both Dr. Dareen PianoAnderson and [name] for a response

## 2014-11-25 ENCOUNTER — Encounter (HOSPITAL_BASED_OUTPATIENT_CLINIC_OR_DEPARTMENT_OTHER): Payer: Self-pay | Admitting: Obstetrics & Gynecology

## 2014-11-25 NOTE — Telephone Encounter (Signed)
Patient is requesting to have the nexlanon removed at tomorrow's appointment with Dr Laurell JosephsBurke. Please call to advise, ok to leave detailed message on voicemail.

## 2014-11-25 NOTE — Telephone Encounter (Signed)
Results were released.    Closing TE.

## 2014-11-26 ENCOUNTER — Encounter (HOSPITAL_BASED_OUTPATIENT_CLINIC_OR_DEPARTMENT_OTHER): Payer: Self-pay | Admitting: Counselor

## 2014-11-26 ENCOUNTER — Encounter (INDEPENDENT_AMBULATORY_CARE_PROVIDER_SITE_OTHER): Payer: Self-pay | Admitting: Obstetrics & Gynecology

## 2014-11-26 ENCOUNTER — Encounter (HOSPITAL_BASED_OUTPATIENT_CLINIC_OR_DEPARTMENT_OTHER): Payer: Self-pay

## 2014-11-26 ENCOUNTER — Ambulatory Visit
Admit: 2014-11-26 | Discharge: 2014-11-26 | Disposition: A | Discharge status: Home / Self Care | Payer: No Typology Code available for payment source | Attending: Obstetrics/Gynecology | Admitting: Obstetrics/Gynecology

## 2014-11-26 ENCOUNTER — Ambulatory Visit (HOSPITAL_BASED_OUTPATIENT_CLINIC_OR_DEPARTMENT_OTHER)
Payer: No Typology Code available for payment source | Attending: Obstetrics/Gynecology | Admitting: Obstetrics & Gynecology

## 2014-11-26 ENCOUNTER — Telehealth (HOSPITAL_BASED_OUTPATIENT_CLINIC_OR_DEPARTMENT_OTHER): Payer: Self-pay | Admitting: Obstetrics & Gynecology

## 2014-11-26 ENCOUNTER — Ambulatory Visit (INDEPENDENT_AMBULATORY_CARE_PROVIDER_SITE_OTHER): Payer: No Typology Code available for payment source | Admitting: Obstetrics & Gynecology

## 2014-11-26 VITALS — BP 131/84 | HR 88 | Temp 98.2°F | Ht 63.0 in | Wt 221.3 lb

## 2014-11-26 VITALS — BP 130/81 | HR 88 | Temp 98.6°F | Wt 220.0 lb

## 2014-11-26 DIAGNOSIS — R102 Pelvic and perineal pain: Secondary | ICD-10-CM | POA: Insufficient documentation

## 2014-11-26 DIAGNOSIS — N762 Acute vulvitis: Secondary | ICD-10-CM | POA: Insufficient documentation

## 2014-11-26 DIAGNOSIS — Z3049 Encounter for surveillance of other contraceptives: Secondary | ICD-10-CM

## 2014-11-26 DIAGNOSIS — N761 Subacute and chronic vaginitis: Secondary | ICD-10-CM

## 2014-11-26 DIAGNOSIS — N760 Acute vaginitis: Secondary | ICD-10-CM

## 2014-11-26 DIAGNOSIS — F419 Anxiety disorder, unspecified: Secondary | ICD-10-CM

## 2014-11-26 DIAGNOSIS — Z308 Encounter for other contraceptive management: Secondary | ICD-10-CM | POA: Insufficient documentation

## 2014-11-26 DIAGNOSIS — N94810 Vulvar vestibulitis: Secondary | ICD-10-CM

## 2014-11-26 HISTORY — DX: Vulvar vestibulitis: N94.810

## 2014-11-26 LAB — PR WET MOUNTS INCL PREP VAGINAL CERV/SKIN SPECIMENS, ONSITE
Clue Cells: NEGATIVE
Odor: NEGATIVE
RBC: NEGATIVE
Trich: NEGATIVE
Yeast, URN: NEGATIVE

## 2014-11-26 LAB — PR ALL POTASSIUM HYDROXIDE PREPARATIONS: Hyphae: NEGATIVE

## 2014-11-26 MED ORDER — METRONIDAZOLE 0.75 % VA GEL
1.0000 | VAGINAL | Status: DC
Start: 2014-11-27 — End: 2014-12-31

## 2014-11-26 MED ORDER — TRIAMCINOLONE ACETONIDE 0.1 % EX OINT
1.0000 | TOPICAL_OINTMENT | Freq: Every evening | CUTANEOUS | Status: DC
Start: 2014-11-26 — End: 2015-02-07

## 2014-11-26 MED ORDER — TRIAMCINOLONE ACETONIDE 0.1 % EX OINT
1.0000 | TOPICAL_OINTMENT | Freq: Every day | CUTANEOUS | Status: DC
Start: 2014-11-26 — End: 2014-11-26

## 2014-11-26 NOTE — Progress Notes (Signed)
Behavioral Health Intervention Program [facility](BHIP) Referral     11/26/2014     Brief description: BHIP Care coordination/referral- MH tiering checked    Patient description, presenting problem, patient / family goals:  24 year old year old English-speaking female [facility] with hx of Anxiety. Pt has been diagnosed with [facility] as pt has been seeing community MH case mgmt since [facility] and [facility] with Jeannette CorpusMathew Albert, [phone].  Pt is on Medicaid with [place] coverage.  [facility] for assessment and care coordination.   Per [facility] provider, Dolphus JennyEckert asked this provider to provide help with pt's mental health needs . Pt has seen multiple gyn providers, often concerned about pregnancy and birth control and having short turn around for trying birth control methods and frequent contact to multiple providers which can cause confusion in care plan.    Intervention / Outcome / Plan: This provider checked MH tiering to determine BHIP eligibility. Pt is currently tiered as noted above. Reported findings of pt's current MH status and support to Dr. Francesca JewettIrwin and will forward to Dr. Dolphus JennyEckert & Dr. Laurell JosephsBurke. Pt is also being assisted with care coordination [facility], Ernest HaberGrace Bechle- MSW.  Please direct MH issues to [place] or [place].   No contact with pt for this encounter.   ~~~~~~~~~~~~~~~~~~   Francisca DecemberKimela Abdulhadi Stopa, LICSW   Behavioral Health Intervention Program [facility](BHIP)- [facility]   [phone]
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
## 2014-11-26 NOTE — Telephone Encounter (Signed)
Routing to Dr Burke.

## 2014-11-26 NOTE — Patient Instructions (Addendum)
Stop boric acid.   Start using metronidazole gel 2 x per week for 8 weeks.   Start applying 0.1% triamcinolone ointment once a night in affected area.   Plan to take pregnancy test one month after last intercourse.     VULVAR VESTIBULITIS    INTRODUCTION   Vulvar vestibulitis is a condition that causes pain with touch or attempted intercourse from inflammation present at the vaginal entrance. The initial cause of this very complex and chronic condition is not usually known. However, a particularly severe or prolonged yeast infection appears to be a trigger in some cases. If the inflammation is present long enough, the nerve endings become sensitized to cause increased pain.  Further, deep pelvic pain can occur if patients contract their pelvic muscles and cause pelvic muscle spasm.    SYMPTOMS   The most common symptom is pain with initial penetration during attempted intercourse. The pain can be so severe that intercourse is not possible, but it also can be less severe, so that intercourse is possible, but not pleasant. Pain also can occur with placing a tampon into the vagina or even with sitting or walking. Deep pelvic pain can occur from pelvic muscle spasms.    CLINICAL FINDINGS   The most consistent finding is redness and tenderness from inflammation at the vaginal opening. The degree and extent of the redness and tenderness varies greatly form a small area of minimal tenderness to larger areas of exquisite tenderness to even light touch. Pelvic muscle tenderness and tightness may be present.    TREATMENT   No uniform treatment of vestibulitis exists, given the unknown cause and complex nature. We use a several treatment approaches for vulvar vestibulitis.    First, we rule out any inciting factors. If yeast is present, it is reasonable to both treat the present infection and suppress the presence of yeast for months.   Second, we work to decrease the inflammation. As the inflammation decreases pain also often  decreases. Our clinic uses topical steroids, which can lessen the redness and tenderness, especially if started within 6-12 months after onset. However, vestibulitis of a long duration of over 2 years is difficult to successfully treat. We also use both topical tacrolimus and injection of steroids into the inflamed tissue itself to reduce inflammation. Occasionally, surgical removal of the inflamed tissue is used to reduce symptoms.   Third, patients with severe pain can use intermittent ice on the vulva. (Ice packs should be covered and not used for more than 20 minutes at a time). Topical lidocaine ointment can also provide temporary relief during severe pain or allow intercourse with less pain. Pelvic muscle spasm can respond to physical therapy. Other groups more commonly use drugs such as nortripytline and gabapentin to reduce the nerve response and pain. These drugs can be helpful but have side effects.    RECURRENCE   Once the inflammation and tenderness of vulvar vestibulitis is reduced, recurrence is  not common, unless a vulvar infection, such as yeast develops.    PREVENTION     Little is known about prevention.

## 2014-11-26 NOTE — Telephone Encounter (Signed)
CONFIRMED PHONE NUMBER: [phone]  CALLERS FIRST AND LAST NAME: Cathie BeamsEdwin  FACILITY NAME: [facility] TITLE: Pharmacist  CALLERS RELATIONSHIP:OTHER: Pharmacy  RETURN CALL: Detailed message on voicemail only     [facility] ONLY) Texting is an option for this clinic. If you would like us to use this option, which mobile phone number should we text to if we are unable to reach you?    SUBJECT: General Message   REASON FOR REQUEST: Medication questions    MESSAGE: Please call to clarify instructions for MetroNIDAZOLE 0.75 % Vaginal Gel
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
## 2014-11-26 NOTE — Progress Notes (Signed)
This encounter was opened in error.

## 2014-11-26 NOTE — Telephone Encounter (Signed)
Spoke with Dorma RussellEdwin who reports that Flagyl gel comes as 70g and pt's insurance only pays for 1 box at a time.  He recommends that we call in 70 g with 1 refill.  Verbally approved this.

## 2014-11-27 ENCOUNTER — Encounter (HOSPITAL_BASED_OUTPATIENT_CLINIC_OR_DEPARTMENT_OTHER): Payer: No Typology Code available for payment source | Admitting: Rehabilitative and Restorative Service Providers"

## 2014-11-27 NOTE — Telephone Encounter (Signed)
Closing TE

## 2014-11-27 NOTE — Progress Notes (Signed)
I saw and evaluated the patient. I have reviewed the resident's documentation and agree with it.  Pt is well known to me, and has started receiving mental health care in [place] - carries dx of OCD and also reports anxiety treatment in the past.  Long Long discussion about the approach to wellness needs to include mental health and not solely her vaginal, bladder or contraceptive issues.  Pt has been seeing a provider roughly every other day, often different providers at multiple clinics, and is also seeking care through a naturopath.  Attempted to explain to her that seeing this many doctors with this frequency usually results in less quality care (compared to follow up with a single provider).  Additionally, in the last month patient has had two different LARC methods placed, and then removed shortly therafter.  Is still on depoprovera (last dose 10 weeks ago) and no menses but is fixating on fear of pregnancy despite negative pregnancy test 5 days ago.    Advised patient that we would proceed with Nexplanon removal despite her personally asking me to place it 7 days ago.  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    Has vestibulitis on exam today, and have initiated triamcinolone for this and also reinforced Dr. Evlyn CourierKirby's recommendation for pelvic floor PT.  Long discussion about lack of data for lactobacilli replenishment in vagina with currently available supplements    PT is encouraged to follow up with mental health closely, but to try to maintain her scheduled f/u appointments for vaginitis vs. Seeking care so frequently at sites all over the city.    This was also explained to patient's husband.  He is encouraging her and taking her to a naturopath "to get her periods straightened out" -Unclear to this provider who is  initiating frequent visits. I did state that since patient has received depoprovera 10 weeks ago, her lack of periods is to be expected.     ---------------------------------------------    Procedure:  Nexplanon removal.  The procedure risks and benefits were discussed with the patient.  Written consent obtained.  The procedure took 5 minutes or less to perform.  I was personally present for the entire procedure.    ---------------------------------------------    Total of 60 minutes face to face time, >50% in counseling

## 2014-11-28 ENCOUNTER — Encounter (INDEPENDENT_AMBULATORY_CARE_PROVIDER_SITE_OTHER): Payer: Self-pay | Admitting: Gastroenterology

## 2014-11-28 ENCOUNTER — Ambulatory Visit: Payer: No Typology Code available for payment source | Admitting: Gastroenterology

## 2014-11-28 VITALS — BP 125/75 | HR 93 | Temp 98.3°F | Ht 63.0 in | Wt 220.0 lb

## 2014-11-28 DIAGNOSIS — R12 Heartburn: Secondary | ICD-10-CM

## 2014-11-28 DIAGNOSIS — R110 Nausea: Secondary | ICD-10-CM

## 2014-11-28 DIAGNOSIS — R1030 Lower abdominal pain, unspecified: Secondary | ICD-10-CM

## 2014-11-28 MED ORDER — ONDANSETRON 8 MG OR TBDP
8.0000 mg | ORAL_TABLET | Freq: Three times a day (TID) | ORAL | Status: AC | PRN
Start: 2014-11-28 — End: 2014-12-28

## 2014-11-28 NOTE — Patient Instructions (Addendum)
Continue diet as we discussed at last visit  Restart  Prilosec 20mg  30 minutes before a meal  Continue Ginger and Dandelion root teas-2-3 times daily  Continue Magnesium Oxide 800mg  at bedtime  Try Back on Tract tea-Traditional Medicinals

## 2014-11-28 NOTE — Progress Notes (Signed)
GASTROENTEROLOGY CLINIC NOTE    LAST CLINIC VISIT:10/30/14      HISTORY OF PRESENT ILLNESS:    Patient is a 24 year old female who returns for follow-up of chronic constipation, heartburn , dyspepsia and abdominal pain for which I saw her last on March 31 and please refer to last GI consultation note for details.  At last visit I felt that her upper GI symptoms could be related to mild post viral gastroparesis, gastroesophageal reflux disease or functional upper GI disease and the chronic constipation likely secondary to irritable bowel syndrome . I recommended that patient stay on a modified FODMAPs diet ,use herbal teas , magnesium oxide and decrease Prevacid to one a day, of note is the fact that patient did have an upper endoscopy as well as colonoscopy in April 2015 which should been essentially normal and gastric and esophageal biopsies were also normal.  Patient states that she followed almost made recommendations and had been feeling much better in terms of her GI symptoms but approximately 2 weeks ago she  has had a recurrence of nausea, regurgitation, heartburn and lower abdominal pain.  Her bowel movements currently more regular.  Had lower abdominal pain is not related to eating but increased with movement or urination.  Also being followed in the gynecology and urogynecology clinics for ongoing issues with pelvic pain and vaginosis.  She does admit to having stopped the Prevacid suddenly 2 weeks ago because she was feeling better and does admit to being under a lot of stress.  She denies dysphagia or vomiting.    PAST MEDICAL & SURGICAL HISTORY:  Active Ambulatory Problems     Diagnosis Date Noted   . Depression 02/19/2013   . Hiatal hernia 02/19/2013   . PTSD (post-traumatic stress disorder) 02/19/2013   . Subacute vaginitis 09/10/2014   . Encounter for surveillance of injectable contraceptive 09/10/2014   . Primary insomnia 09/10/2014   . Abnormal cervical Papanicolaou smear 09/14/2014   .  Constipation, unspecified 10/06/2014     Resolved Ambulatory Problems     Diagnosis Date Noted   . No Resolved Ambulatory Problems     Past Medical History   Diagnosis Date   . Obesity    . Pap smear abnormality of cervix    . Heart burn    . Chest pain    . History of irritable bowel syndrome    . Frequent use of laxatives    . GERD (gastroesophageal reflux disease)    . Indigestion    . Nausea and vomiting        ALL:  Review of patient's allergies indicates:  Allergies   Allergen Reactions   . Prednisone Rash       MEDS:  Outpatient Prescriptions Prior to Visit   Medication Sig Dispense Refill   . Boric Acid Does not apply Powder 600 mg in "O" gelatin capsule. Place in vagina nightly for 28 days, then twice a week 2000 g 1   . CRANBERRY OR      . Etonogestrel-Ethinyl Estradiol (NUVARING) 0.12-0.015 MG/24HR Vaginal RING Place 1 ring into the vagina every 28 days. Leave in place for 21 days, then remove for 1 week, and then replace with new ring. 13 ring 0   . Fluconazole 150 MG Oral Tab Take 1 tablet (150 mg) by mouth every 3 days. 3 tablet 0   . Lansoprazole (PREVACID OR) 20 mg twice a day     . Magnesium Oxide 400 MG Oral Tab Take 2  tablets (800 mg) by mouth daily. 60 tablet 4   . MedroxyPROGESTERone Acetate (DEPO-PROVERA IM)      . MetroNIDAZOLE (FLAGYL) 500 MG Oral Tab Take 1 tablet (500 mg) by mouth 2 times a day with meals. 14 tablet 0   . MetroNIDAZOLE 0.75 % Vaginal Gel Place 1 applicatorful into the vagina 2 times a week for 16 doses. Use 2 x weekly for 8 weeks. 45 g 0   . Omeprazole 20 MG Oral CAPSULE DELAYED RELEASE      . Ondansetron 4 MG Oral TABLET DISPERSIBLE      . Probiotic Product (SOLUBLE FIBER/PROBIOTICS OR)      . Psyllium (METAMUCIL OR)      . TraZODone HCl 100 MG Oral Tab Take 1.5 tablets (150 mg) by mouth at bedtime. For Insomnia. 90 tablet 3   . Triamcinolone Acetonide 0.1 % External Ointment Apply 1 application to affected area on genitals at bedtime. 15 g 3     No facility-administered  medications prior to visit.       REVIEW OF SYSTEMS:  ROS as documented on the Patient Information Form completed by the patient and this was reviewed with the patient.    SOCIAL HISTORY:  History     Social History   . Marital Status: Single     Spouse Name: N/A   . Number of Children: N/A   . Years of Education: N/A     Occupational History   . Not on file.     Social History Main Topics   . Smoking status: Former Smoker -- 0.50 packs/day for 2 years     Types: Cigarettes   . Smokeless tobacco: Never Used   . Alcohol Use: Yes      Comment: Rare   . Drug Use: No   . Sexual Activity: No      Comment: didn't tolerate Nexplanon after one year.      Other Topics Concern   . Not on file     Social History Narrative         FAMILY HISTORY:    No FH of gastrointestinal diseases or malignancies.    PHYSICAL EXAMINATION:   Filed Vitals:    11/28/14 1408   BP: 125/75   Pulse: 93   Temp: 98.3 F (36.8 C)   TempSrc: Temporal   Height:  (1.6 m)   Weight: 220 lb (99.791 kg)   SpO2: 99%     GEN: well-kept, well-developed, normal weight, NAD  HEENT: anicteric, atraumatic, non-erythematous OP  NECK: no cervical LAD, supple  CHEST: CTA bilat  CV: nl s1, s2 with no MRG  ABD: +BS, soft, NT, NG, non-tympanic, no hepatosplenomegaly  RECTAL:   JOINT:  No increased erythema or heat  SKIN: no rashes  PSYCH:  Normal judgement and insight  NEURO: A&Ox3, nl gait    LAB  Office Visit on 11/26/2014   Component Date Value Ref Range Status   . Special Requests 11/26/2014 None   Preliminary   . Stain For Fungus 11/26/2014 No fungal elements seen   Preliminary   . Culture 11/26/2014    Preliminary                    Value:No yeast isolated  to date     . Odor 11/26/2014 neg   Final   . WBC 11/26/2014 scant   Final   . RBC 11/26/2014 neg   Final   . Bacteria 11/26/2014 few  Final   . Clue Cells 11/26/2014 neg   Final   . Yeast, URN 11/26/2014 neg   Final   . Epithelial Cells 11/26/2014 present   Final   . Trich 11/26/2014 neg   Final   .  Hyphae 11/26/2014 neg   Final   Office Visit on 11/24/2014   Component Date Value Ref Range Status   . Special Requests 11/26/2014 No special requests   Preliminary   . Culture 11/26/2014 No Mycoplasma genitalium, Mycoplasma hominis,  Ureaplasma urealyticum, or Ureaplasma parvum DNA detected with 16S rRNA gene amplified probes.   Preliminary   . Culture 11/26/2014    Preliminary                    Value:Analytical sensitivities (genomes):  4  Analytical sensitivities or minimal detection limits are expressed in copies of bacterial or fungal genomic DNA in a single amplification reaction performed on purified DNA.  Sensitivity of detecting microbial DNA from a tissue or fluid sample will vary depending on the organism load in the sample submitted to our lab for testing, pretreatment such as formaldehyde fixation   or staining, or any process that introduces exogenous microorganisms or microbial DNA into the sample submitted for testing.  This test was developed by the Department of Laboratory Medicine, [place] [facility] [place].     . Culture 11/26/2014 PENDING REVIEW BY PATHOLOGIST.   Preliminary   . Urine Volume 11/24/2014 174 ml   Final   . Other 11/24/2014 16.7 sec   Final    flow time   . Other 11/24/2014 18.0 ml/s   Final    Qmax   . Other 11/24/2014 10.4 ml/s   Final    Qmean   . Other 11/24/2014 4.8 sec   Final    TQ max   . Other 11/24/2014 16.3 sec   Final    total time   . Volume, URN 11/24/2014 0 ml   Final   Office Visit on 11/21/2014   Component Date Value Ref Range Status   . Color, Urine 11/21/2014 YELLOW   Final   . Clarity, URN 11/21/2014 CLEAR   Final   . Glucose, Urine 11/21/2014 NEGATIVE  NEG mg/dL Final   . Bilirubin, Urine 11/21/2014 NEGATIVE  NEG Final   . Ketones, URN 11/21/2014 NEGATIVE  NEG mg/dL Final   . Specific Gravity, Urine 11/21/2014 1.020  1.005 - 1.030 Final   . Occult Blood, URN 11/21/2014 NEGATIVE  NEG Final   . pH, URN 11/21/2014 5.5  5.0 - 8.0 Final   . Protein 11/21/2014  NEGATIVE  NEG-TRACE mg/dL Final   . Urobilinogen, URN 11/21/2014 0.2  0.2 - 1.0 E.U./dL Final   . Nitrite, URN 11/21/2014 NEGATIVE  NEG Final   . Leukocytes 11/21/2014 NEGATIVE  NEG Final   . Pregnancy (HCG) ([facility]), URN 11/21/2014 NEGATIVE   Final   . INTERNAL CONTROL 11/21/2014 Control Verified   Final   . Special Requests 11/21/2014 none   Final   . Colony Count 11/21/2014    Final                    Value:100  Col/mL     . Culture 11/21/2014    Final                    Value:1+  Gram positive flora     . Pregnancy (HCG), SRM 11/21/2014 1  <6 m[IU]/mL Final  Comment: NEGATIVE: = or <5 mIU/mL   by Beckman DxI Assay calibration traced to the [facility]   5th International Standard for Chorionic Gonadotropin (NIBSC Code 07/364).     Office Visit on 11/19/2014   Component Date Value Ref Range Status   . Special Requests 11/19/2014 None   Final   . Culture 11/19/2014 No growth (<1000 Col/mL)   Final   . Color, URN 11/19/2014 yellow   Final   . Clarity, URN 11/19/2014 clear   Final   . Glucose, Urine 11/19/2014 neg  NEG mg/dL Final   . Bilirubin (Indirect) 11/19/2014 neg  NEG Final   . Ketones, URN 11/19/2014 40  NEG mg/dL Final   . Occult Blood, URN 11/19/2014 tr  NEG Final   . pH, URN ([facility]) 11/19/2014 6.0  5.0 - 8.0 Final   . Protein 11/19/2014 neg  NEG-TRACE mg/dL Final   . Urobilinogen, URN 11/19/2014 0.2  0.2 - 1.0 E.U./dL Final   . Nitrite, URN 11/19/2014 neg  NEG Final   . Leukocytes 11/19/2014 tr  NEG Final   . Special Requests 11/19/2014 NOTE: Collection date/time not provided. Result(s) may be compromised since the integrity of the specimen cannot be determined.   Final   . Stain For Fungus 11/19/2014 No fungal elements seen   Final   . Culture 11/19/2014 No yeast isolated   Final   . Odor 11/19/2014 neg   Final   . WBC 11/19/2014 few   Final   . RBC 11/19/2014 neg   Final   . Bacteria 11/19/2014 few   Final   . Clue Cells 11/19/2014 neg   Final   . Yeast, URN 11/19/2014 neg   Final   . Epithelial Cells 11/19/2014  3+   Final   . Trich 11/19/2014 neg   Final   . Other 11/19/2014 pH 5   Final   . Hyphae 11/19/2014 neg   Final   Office Visit on 11/05/2014   Component Date Value Ref Range Status   . Odor 11/05/2014 neg   Final   . WBC 11/05/2014 neg   Final   . RBC 11/05/2014 neg   Final   . Bacteria 11/05/2014 neg   Final   . Clue Cells 11/05/2014 neg   Final   . Yeast, URN 11/05/2014 neg   Final   . Epithelial Cells 11/05/2014 3+   Final   . Ivery Quale 11/05/2014 neg   Final   . Other 11/05/2014 pH 6   Final   . Hyphae 11/05/2014 possible   Final   . Special Requests 11/05/2014    Final                    Value:Please assess for fluconazole sensitivity if positive  NOTE: Collection date/time not provided. Result(s) may be compromised since the integrity of the specimen cannot be determined.     . Stain For Fungus 11/05/2014 No fungal elements seen   Final   . Culture 11/05/2014 No yeast isolated at 3 days   Final   Office Visit on 10/27/2014   Component Date Value Ref Range Status   . Color, Urine 10/27/2014 YELLOW   Final   . Clarity, URN 10/27/2014 CLEAR   Final   . Glucose, Urine 10/27/2014 NEG  NEG mg/dL Final   . Bilirubin, Urine 10/27/2014 NEG  NEG Final   . Ketones, URN 10/27/2014 NEG  NEG mg/dL Final   . Specific Gravity, Urine 10/27/2014 1.015  1.005 -  1.030 Final   . Occult Blood, URN 10/27/2014 TRACE-NH* NEG Final   . pH, URN 10/27/2014 5.5  5.0 - 8.0 Final   . Protein 10/27/2014 NEG  NEG-TRACE mg/dL Final   . Urobilinogen, URN 10/27/2014 0.2  0.2 - 1.0 E.U./dL Final   . Nitrite, URN 10/27/2014 NEG  NEG Final   . Leukocytes 10/27/2014 NEG  NEG Final   . Odor 10/27/2014 ABSENT   Final   . WBC 10/27/2014 2+   Final   . RBC, Wet Prep 10/27/2014 NEG   Final   . Bacteria 10/27/2014 4+   Final   . Clue Cells 10/27/2014 NEG   Final   . Yeast, Wet Prep 10/27/2014 NEG   Final   . Epithelial Cells, Wet Prep 10/27/2014 3+   Final   . Ivery Quale 10/27/2014 NEG   Final   . Pregnancy (HCG) ([facility]), URN 10/27/2014 NEGATIVE   Final   .  INTERNAL CONTROL 10/27/2014 Control Verified   Final   Office Visit on 10/08/2014   Component Date Value Ref Range Status   . Pregnancy (HCG) [facility]), URN 10/08/2014 NEG   Final   . INTERNAL CONTROL 10/08/2014 Control Verified   Final   Office Visit on 10/06/2014   Component Date Value Ref Range Status   . Pregnancy (HCG) [facility]), URN 10/06/2014 NEGATIVE   Final   . INTERNAL CONTROL 10/06/2014 Control Verified   Final   Office Visit on 09/30/2014   Component Date Value Ref Range Status   . Odor 09/30/2014 ABSENT   Final   . WBC 09/30/2014 NEGATIVE   Final   . RBC, Wet Prep 09/30/2014 1+   Final   . Bacteria 09/30/2014 NEGATIVE   Final   . Clue Cells 09/30/2014 NEGATIVE   Final   . Yeast, Wet Prep 09/30/2014 NEGATIVE   Final   . Epithelial Cells, Wet Prep 09/30/2014 1+   Final   . Trich 09/30/2014 NEGATIVE   Final        RADIOLOGY:  Please refer to HPI    ASSESSMENT/PLAN:  24 year old yo female with 1.Ongoing heartburn, nausea and regurgitation likely secondary to a combination of gastroesophageal reflux disease and functional upper GI disease which may have been aggravated discontinuing the PPI, ongoing stressors and home hormonal changes  2.  Constipation which is improved secondary to irritable bowel syndrome  3. Lower abdominal and pelvic pain likely related to ulcer irritable bowel syndrome and  gynecological issues  Plan:1.  Discussed pathophysiology of her symptoms and association between increase in symptoms with discontinuing  PPIs and association with stress.  2.  Continue modified FODMAPs diet, herbal teas and magnesium oxide  3.  Restart omeprazole 20 mg daily and will need to gradually taper this once patient's symptoms are improved  4.  Follow-up in 4 weeks for reevaluation

## 2014-11-29 LAB — R/O YEAST CULT W/DIRECT EXAM: Stain For Fungus: NONE SEEN

## 2014-12-01 ENCOUNTER — Ambulatory Visit (INDEPENDENT_AMBULATORY_CARE_PROVIDER_SITE_OTHER): Payer: No Typology Code available for payment source | Admitting: Physician Assistant

## 2014-12-01 ENCOUNTER — Telehealth (INDEPENDENT_AMBULATORY_CARE_PROVIDER_SITE_OTHER): Payer: Self-pay | Admitting: Clinical

## 2014-12-01 ENCOUNTER — Encounter (INDEPENDENT_AMBULATORY_CARE_PROVIDER_SITE_OTHER): Payer: Self-pay | Admitting: Physician Assistant

## 2014-12-01 ENCOUNTER — Telehealth (INDEPENDENT_AMBULATORY_CARE_PROVIDER_SITE_OTHER): Payer: Self-pay | Admitting: Physician Assistant

## 2014-12-01 VITALS — BP 121/78 | HR 87 | Temp 99.1°F | Wt 219.0 lb

## 2014-12-01 DIAGNOSIS — F419 Anxiety disorder, unspecified: Secondary | ICD-10-CM

## 2014-12-01 DIAGNOSIS — F5101 Primary insomnia: Secondary | ICD-10-CM

## 2014-12-01 LAB — MYCOPLASMA, GENITAL PCR

## 2014-12-01 MED ORDER — HYDROXYZINE PAMOATE 25 MG OR CAPS
ORAL_CAPSULE | ORAL | Status: DC
Start: 2014-12-01 — End: 2014-12-25

## 2014-12-01 MED ORDER — DULOXETINE HCL 30 MG OR CPEP
DELAYED_RELEASE_CAPSULE | ORAL | Status: DC
Start: 2014-12-01 — End: 2014-12-25

## 2014-12-01 NOTE — Telephone Encounter (Signed)
Routing to PA Pool.

## 2014-12-01 NOTE — Telephone Encounter (Signed)
CONFIRMED PHONE NUMBER: [phone]  CALLERS FIRST AND LAST NAME: Joellyn RuedMorrison, [place]  FACILITY NAME: n/a TITLE: n/a  CALLERS RELATIONSHIP:Self  RETURN CALL: Detailed message on voicemail only     ([facility] ONLY) Texting is an option for this clinic. If you would like us to use this option, which mobile phone number should we text to if we are unable to reach you?    SUBJECT: Medication Management   REASON FOR REQUEST: Medication Management    MEDICATION(S): DULoxetine HCl 30 MG Oral CAPSULE ENTERIC COATED PARTICLES  MEDICATION(S)  NEEDED BY: 5/02 if possible  ADDITIONAL INFORMATION: Patient was advised that a prior authorization will be needed. Please send a prior authorization for this medication to patient's insurance.

## 2014-12-01 NOTE — Patient Instructions (Addendum)
It was a pleasure to see you in clinic today.                  If you are not yet signed up for eCare, please speak with a team member at the front desk who would happy to assist you with this process.      eCare  enrollment will allow you access to the below benefits   You can make appointments online   View test results / Lab Results   Obtain a copy of our After Visit Summary (a summary of your visit today)     Your Test Results:  If labs were ordered today the results are expected to be available via eCare in about 5 days. If you have an active eCare account, this is how we will notify you of your results.     If you do not have an eCare account then your test results will be mailed to you within about 14 days after your tests are completed. If your physician needs to change your care based on your results or is concerned, you will receive a phone call.     If you have any questions about your test results please schedule an appointment with your provider.    **If it has been more than 2 weeks and you have not received your test results please send our office a message via eCare.    Medication Refills: If you need a prescription refilled, please contact your pharmacy 1 week before your current supply will run out to request the refill.  Contacting your pharmacy is the fastest and safest way to obtain a medication refill.  The pharmacy will notify our office.  Please note, that a minimum of 48 to 72 hours is needed to refill a medication, but refills are usually processed and sent to your pharmacy in about 5 business days.  Please call your pharmacy early to allow enough time to refill before you anticipate running out.  For faster medication refills, you can also schedule an appointment with your provider.    We know you have a choice in where you receive your healthcare and we sincerely thank you for trusting [name] with your health.      Please see [name] in 3 weeks.

## 2014-12-01 NOTE — Progress Notes (Signed)
Reason for visit: anxiety, back pain   Reviewed eCare status with Patient:  NO    HEALTH MAINTENANCE:  Has the patient had any of these since their last visit?    Cervical screening/PAP: N/A     Mammo: N/A    Colon Screen: N/A    Have you seen a specialist since your last visit: dr. Bartholome BillEckhart [place]        HM Due: none   Health Maintenance   Topic Date Due   . HIV Screen  01/07/1993   . Pap Smear  08/30/2015   . Chlamydia Screen  09/18/2015   . Tetanus Vaccine  12/26/2022   . Influenza Vaccine  Completed   . HPV Vaccine  Completed           Future Appointments  Date Time Provider Department Center   12/01/2014 3:15 PM Cletis AthensVierra, Michelle [place], [place] [facility]   12/03/2014 2:30 PM Lamar LaundryBrown, Wiletta L, PT [facility]   12/24/2014 4:00 PM Lamar LaundryBrown, Takoya L, PT [facility]2 [facility]   12/26/2014 3:15 PM Yates DecampKrishnamurthy, Shoba, MBBS UESGI UESC   12/31/2014 2:00 PM Soundra Pilonhen, Tiffany Ann, MD [facility]    12/31/2014 3:15 PM Lamar LaundryBrown, Brendaliz L, PT [facility]   01/07/2015 2:30 PM Lamar LaundryBrown, Shannan L, PT [facility]

## 2014-12-01 NOTE — Telephone Encounter (Signed)
SOCIAL WORK FOLLOW-UP NOTE    12/01/2014  Brief description: Pt called SW to give "the heads up" to Sharyn CreamerMichelle Vierra about today's appt.    Patient description, presenting problem, patient / family goals: Pt reports she's had anxiety for the past 2 weeks because of worries about being pregnant, "I'm so anxious all the time time, I can't sleep, I can't function, I can't think, I'm miserable... To the point I've thought about hurting myself."  Pt reports having SI 2 days ago, feelings of "no hoping for staying here."  Pt reports having a plan, "trying to throw myself down the stairs"or taking boric acid suppositories "I know it can be bad when taken orally," but pt reports she has had no intent to act on these plans.  Pt reports she is able to keep herself safe, has an app[facility] this [facility]  Pt reports she will talk to her counselor about getting on the [facility] in order to establish with long term medication management.      Pt reports she has an appt with Sharyn CreamerMichelle Vierra for this afternoon, "to see if she can prescribe something for me."  Pt reports her "boyfriend doesn't want me to be on something long term, he's thinking something short term like Xanax... I can't take Xanax every day."  Pt reports that bf doesn't want her to take an anti-depressant, something that "builds up in my system" because pt reports she used to take 275mg  of Venlafaxine (?) which was "hard to come off of... If they gave me something like 50-30mg , I could handle that."  Pt reports needing "something I won't have to wait for, for it to take effect... I'm at the point, I don't think I could give it 2-3 weeks to work."  Pt reports it's ok to have bf come into appt with her.      Intervention / Outcome / Plan: SW will share pt concerns with Sharyn CreamerMichelle Vierra before pt appt.

## 2014-12-02 ENCOUNTER — Encounter (INDEPENDENT_AMBULATORY_CARE_PROVIDER_SITE_OTHER): Payer: Self-pay

## 2014-12-02 ENCOUNTER — Telehealth (HOSPITAL_BASED_OUTPATIENT_CLINIC_OR_DEPARTMENT_OTHER): Payer: Self-pay | Admitting: Obstetrics/Gynecology

## 2014-12-02 NOTE — Telephone Encounter (Signed)
-----   Message from Kristen RudAnna Catherine Kirby, MD sent at 12/02/2014 12:37 AM PDT -----  Could you please let this patient know she does not have a mycoplasma infection?    Thank you!  [name]

## 2014-12-02 NOTE — Telephone Encounter (Signed)
Prior Authorization for: Duloxetine   Dosage Amount: 30 MG     [facility] Pharmacy Name: [facility]     [place] Pharmacy Phone #: [phone]    [facility] [phone]    Insurance ID: [id number]    Case # (if applicable): None     Gave form to Doctor (Yes/No)  Submitted to [facility] via [facility]     Michelle Vierra PA     Postponed to 5/5 for follow up due to patients call

## 2014-12-02 NOTE — Telephone Encounter (Signed)
TC to patient, personalized voice mail. Left detailed message relaying Dr. Evlyn CourierKirby's message re: negative for mycoplasma infection.  Asked patient to call back if needing to discuss results or if having any other concerns.  TC encounter closed.    Anastasia FiedlerKendra Nashae Maudlin, RN BSN  [facility]   ------------------------------------------------------------------------------------------------

## 2014-12-03 ENCOUNTER — Ambulatory Visit: Payer: No Typology Code available for payment source | Attending: Obstetrics/Gynecology

## 2014-12-03 ENCOUNTER — Telehealth (INDEPENDENT_AMBULATORY_CARE_PROVIDER_SITE_OTHER): Payer: Self-pay | Admitting: Clinical

## 2014-12-03 DIAGNOSIS — M25551 Pain in right hip: Secondary | ICD-10-CM | POA: Insufficient documentation

## 2014-12-03 DIAGNOSIS — M62838 Other muscle spasm: Secondary | ICD-10-CM | POA: Insufficient documentation

## 2014-12-03 DIAGNOSIS — N761 Subacute and chronic vaginitis: Secondary | ICD-10-CM | POA: Insufficient documentation

## 2014-12-03 DIAGNOSIS — R293 Abnormal posture: Secondary | ICD-10-CM | POA: Insufficient documentation

## 2014-12-03 DIAGNOSIS — R102 Pelvic and perineal pain: Secondary | ICD-10-CM | POA: Insufficient documentation

## 2014-12-03 DIAGNOSIS — M6281 Muscle weakness (generalized): Secondary | ICD-10-CM | POA: Insufficient documentation

## 2014-12-03 DIAGNOSIS — F329 Major depressive disorder, single episode, unspecified: Secondary | ICD-10-CM | POA: Insufficient documentation

## 2014-12-03 DIAGNOSIS — M25552 Pain in left hip: Secondary | ICD-10-CM | POA: Insufficient documentation

## 2014-12-03 DIAGNOSIS — F4310 Post-traumatic stress disorder, unspecified: Secondary | ICD-10-CM | POA: Insufficient documentation

## 2014-12-03 DIAGNOSIS — R3911 Hesitancy of micturition: Secondary | ICD-10-CM | POA: Insufficient documentation

## 2014-12-03 NOTE — Progress Notes (Signed)
Kristen Salinas is a 24 year old female here to discuss the following:    meds for anxiety and insomnia    Patient presents today with her boyfriend Kristen Salinas to discuss medication for anxiety and insomnia.  She had been taking trazodone for insomnia.  She found that it helped her to fall asleep but not to stay asleep.  She reports that she has not gotten more than 6 hours of sleep per night for the last couple of weeks.    She is trying to learn how to drive, and is concerned about grogginess from lack of sleep.    In the last 6 months she has had numerous office visits related to pelvic pain, pregnancy concerns and contraception concerns.  She has had Plan on inserted and removed on 2 occasions as well as IUD placed and then removed within 48 hours.  She has also had numerous pregnancy tests.    She has a Veterinary surgeoncounselor, and they have agreed that she needs to simply abstain from intercourse for a while to alleviate her fears of pregnancy.  She had a Depo-Provera injection 3 months ago, and they feel that the hormones associated with that have caused her mood and her judgment to be off kilter.      She has taken medication for anxiety in the past.  She has taken some for which she cannot remember the name.  Most recently she has taken venlafaxine but wasn't sure that it actually helped.  She has also taken Abilify and did not find that that was a good medication for her.    She is willing to try something new, and due to her pelvic pain and abdominal pain Cymbalta would be a good fit for her.      She is currently not working.  She spends her days on the computer looking at [employer].  She is not getting much, if any, exercise.      Patient Active Problem List   Diagnosis   . Depression   . Hiatal hernia   . PTSD (post-traumatic stress disorder)   . Subacute vaginitis   . Encounter for surveillance of injectable contraceptive   . Primary insomnia   . Abnormal cervical Papanicolaou smear   . Constipation,  unspecified     Current Outpatient Prescriptions   Medication Sig Dispense Refill   . Boric Acid Does not apply Powder 600 mg in "O" gelatin capsule. Place in vagina nightly for 28 days, then twice a week 2000 g 1   . CRANBERRY OR      . Etonogestrel-Ethinyl Estradiol (NUVARING) 0.12-0.015 MG/24HR Vaginal RING Place 1 ring into the vagina every 28 days. Leave in place for 21 days, then remove for 1 week, and then replace with new ring. 13 ring 0   . Fluconazole 150 MG Oral Tab Take 1 tablet (150 mg) by mouth every 3 days. 3 tablet 0   . Lansoprazole (PREVACID OR) 20 mg twice a day     . Magnesium Oxide 400 MG Oral Tab Take 2 tablets (800 mg) by mouth daily. 60 tablet 4   . MedroxyPROGESTERone Acetate (DEPO-PROVERA IM)      . MetroNIDAZOLE (FLAGYL) 500 MG Oral Tab Take 1 tablet (500 mg) by mouth 2 times a day with meals. 14 tablet 0   . MetroNIDAZOLE 0.75 % Vaginal Gel Place 1 applicatorful into the vagina 2 times a week for 16 doses. Use 2 x weekly for 8 weeks. 45 g 0   .  Omeprazole 20 MG Oral CAPSULE DELAYED RELEASE      . Ondansetron 4 MG Oral TABLET DISPERSIBLE      . Ondansetron 8 MG Oral TABLET DISPERSIBLE Dissolve 1 tablet (8 mg) on top of tongue and swallow every 8 hours as needed for nausea/vomiting for up to 30 days. 60 tablet 1   . Probiotic Product (SOLUBLE FIBER/PROBIOTICS OR)      . Psyllium (METAMUCIL OR)      . TraZODone HCl 100 MG Oral Tab Take 1.5 tablets (150 mg) by mouth at bedtime. For Insomnia. 90 tablet 3   . Triamcinolone Acetonide 0.1 % External Ointment Apply 1 application to affected area on genitals at bedtime. 15 g 3     No current facility-administered medications for this visit.       OBJECTIVE:  BP 121/78 mmHg  Pulse 87  Temp(Src) 99.1 F (37.3 C) (Temporal)  Wt 219 lb (99.338 kg)  SpO2 99%  LMP 10/25/2014  CONSTITUTIONAL/GENERAL:  Appearance:  Well developed, appearing stated age and in no acute distress,  Facial features:     External ears and nose are normal in appearance  without significant scars or asymmetry., Weight appears heavy for height, Gait and station:  Normal..  Judgement/insight:  Normal, Mood/affect:  Normal, but appears tired        ASSESSMENT AND PLAN:  (F41.9) Anxiety  (primary encounter diagnosis)  Plan: DULoxetine HCl 30 MG Oral CAPSULE ENTERIC         COATED PARTICLES, HydrOXYzine Pamoate 25 MG         Oral Cap           (F51.01) Primary insomnia  Plan: HydrOXYzine Pamoate 25 MG Oral Cap    I endorsed her plan to not be sexually active for a while due to her inability to tolerate any form of birth control and her concerns about pregnancy.  We further discussed that once the Cymbalta becomes fully effective this will probably also help with her sleep.    I recommended that she stay on Cymbalta for at least 6 months, and that she continue to see her counselor.    We discussed getting out for some exercise as well as pursuing some kind of work or volunteer work so that her mind might be occupied with matters other than her health or somatic concerns.    We further discussed that it is recommended that she return to clinic in 3 weeks to follow up regarding how she's doing on the Cymbalta.  She understands that I will be out of town until the end of May and that she may see one of my colleagues for follow-up as well.    I spent a total time of 25 minutes face-to-face with the patient, of which more than 50% was spent counseling as outlined in this note.                Cletis AthensMichelle Lynne Teddie Salinas, [place]

## 2014-12-03 NOTE — Telephone Encounter (Signed)
SOCIAL WORK FOLLOW-UP NOTE    12/03/2014  Brief description: Pt called SW with concerns around [facility] from [facility]/2 Office visit.    Patient description, presenting problem, patient / family goals: Pt reports Kristen Salinas prescribed Hydroxyzine and Cymbalta, requests a f/u appt in 2 weeks.  Pt reports reading Kristen Salinas's  "visit summary note," with questions about interpretation, pt reports feeling "upset and frustrated."  Pt report having questions about Kristen Salinas's comments about "birth control intolerance" (if note will affect future birth control options) and "somatic concerns" (if Kristen Salinas interpreted pt's pelvic pain as somatic).        Intervention / Outcome / Plan: SW explain to pt the professionalism and high quality care that Sharyn CreamerMichelle Vierra and all our providers have always exhibited, suggest pt talk directly with Sharyn CreamerMichelle Vierra if specific questions or concerns are unanswered.
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
## 2014-12-04 MED ORDER — DULOXETINE HCL 60 MG OR CPEP
60.0000 mg | DELAYED_RELEASE_CAPSULE | Freq: Every day | ORAL | Status: DC
Start: 2014-12-04 — End: 2014-12-25

## 2014-12-04 NOTE — Telephone Encounter (Signed)
noted 

## 2014-12-04 NOTE — Telephone Encounter (Signed)
Insurance approved a one time refill of 30 MG, but due to Titration will approve  60 MG once a day for a year,  Spoke to pharmacy, they will need a new script for the 60 MG, [facility]

## 2014-12-08 ENCOUNTER — Encounter (HOSPITAL_BASED_OUTPATIENT_CLINIC_OR_DEPARTMENT_OTHER): Payer: No Typology Code available for payment source

## 2014-12-11 ENCOUNTER — Telehealth (INDEPENDENT_AMBULATORY_CARE_PROVIDER_SITE_OTHER): Payer: Self-pay | Admitting: Clinical

## 2014-12-11 NOTE — Telephone Encounter (Signed)
SW left voicemail and sent eCare message [facility]

## 2014-12-15 ENCOUNTER — Ambulatory Visit (HOSPITAL_BASED_OUTPATIENT_CLINIC_OR_DEPARTMENT_OTHER): Payer: No Typology Code available for payment source

## 2014-12-15 DIAGNOSIS — R3911 Hesitancy of micturition: Secondary | ICD-10-CM | POA: Insufficient documentation

## 2014-12-15 DIAGNOSIS — M62838 Other muscle spasm: Secondary | ICD-10-CM

## 2014-12-15 DIAGNOSIS — N761 Subacute and chronic vaginitis: Secondary | ICD-10-CM

## 2014-12-15 DIAGNOSIS — M25552 Pain in left hip: Secondary | ICD-10-CM | POA: Insufficient documentation

## 2014-12-15 DIAGNOSIS — M6281 Muscle weakness (generalized): Secondary | ICD-10-CM

## 2014-12-15 DIAGNOSIS — R102 Pelvic and perineal pain: Secondary | ICD-10-CM | POA: Insufficient documentation

## 2014-12-15 DIAGNOSIS — F32A Depression, unspecified: Secondary | ICD-10-CM

## 2014-12-15 DIAGNOSIS — M25551 Pain in right hip: Secondary | ICD-10-CM | POA: Insufficient documentation

## 2014-12-15 DIAGNOSIS — F4310 Post-traumatic stress disorder, unspecified: Secondary | ICD-10-CM

## 2014-12-15 DIAGNOSIS — R293 Abnormal posture: Secondary | ICD-10-CM

## 2014-12-15 NOTE — Progress Notes (Signed)
Physical Therapy Progress Note   Time in: 8:05am          Time out: 8:50am        Duration: 45 minutes  The following identifiers were confirmed with the patient today:name and date of birth  Referring Provider: Jerelyn Charles  Referring provider NPI: [id number]    Today's date: 12/15/2014  Start of Care: 12/03/2014    Number of Visits Since start of care: 2  Reporting Period from 12/15/2014 to 01/14/2015    Subjective/Pain: Kristen Salinas reports to therapy today with her partner, Kristen Salinas. She reports suprapubic and back pain have improved to almost none since her last visit. She did not do home program exercises. She does report she started Cymbalta and Hydroxysine which she thinks is helping with her pain and sleep. Reports this was prescribed to her for her depression which came on after the Depo shot. She reports new symptom of urinary hesitancy (15 seconds) and urinary stream is "thin" versus a normal flow. Does continue to have what feel like spasms of the vaginal opening that come on after urinating, last about 5 minutes then improve as the bladder fills. Spasms are intermittent.  Did not do bladder diaries, but she estimates she is urinating about every 2-3 hours during the day with 0-1 voids per night. Drinking about a 1/2 gallon of water during the day and 2 cups of coffee. Continues to have 4/10 pain with light touch and steroid placement each night. Does report bilateral hip pain.    The patient is reporting 8/10 level of pain/percieved severity of symptoms, on a 0-10 Numeric Pain Distress Scale.     Skilled services/interventions provided:    Manual therapy for 40 minutes: soft tissue mobilization to bilateral hip adductors, light touch desensitization with concurrent pelvic floor relaxation to bulbocavernosus, superficial transverse perineum. Instruction in pelvic floor relaxation with steroid use.    Neuromuscular reeducation for 5 minutes: pelvic floor relaxation in sitting    Patient's response to  therapy: good improvements in suprapubic and back pain since initial session.     Patient education/instruction: verbal instructions and demonstrations were provided for the above exercises    Change in impairments:   Observation: Sway back posture, B knee hyperextension, B heel weight bearing, significant B valgus deformity knees  Impairments:  Range of motion: Lumbar motion full; B hips limited in IR  Myofascial Assessment: + myofascial restrictions abdominal wall and suprapubic pelvis   Muscle Strength: B LEs grossly 4-/5; fair lumbopelvic stabilization with mobility and movement  Sensation: Intact  Reflexes: Intact  Special tests: + pelvic obliquity with R ant ilium rotation  Pelvic Floor Muscle Testing:   Skin integrity: within normal limits   Vaginal vault size: not tested   Sitting Tolerance: within normal limits   Posture: posterior pelvic tilt in sitting   Range of motion/Quality of motion/Recruitment Pattern: Perineal resting level elevated by 50% at baseline. 25% perineal elevation with contraction. nil perineal descent with bulge. Coordination fair, pelvic pain after pelvic floor contraction/lift.   Muscle Strength: Laycock: not tested due to pelvic pain (MMT/Hold in seconds/repetitions at hold/quick)  Sensation: pain with light touch to vestibule 4/10 that improves to 1/10 with sustained hold  Cotton Swab: not tested   Reflexes: Cough: absent, Anal wink: not tested   Palpation: ischiocavernosus, bulbocavernosus, superficial transverse, iliococcygeus, pubococcygeus perineum 5/5 soft tissue restriction with external palpation.   Myofascial/abdominal wall assessment: not tested   Prolapse/Tissue Laxity: not tested   External Anal Sphincter  Contraction: not tested       Functional improvement/progress toward each goals:   1. Complete PFM exam: 50% met  2. B hip strength 5/5: Not met; working towards   3. Normal MF mobility abd wall and suprapubic pelvis: Not met; working towards   4. Level pelvis: goal  met  Long term functional goals/Outcome:   1. Patient able to squat, walk, transfer sit to stand with pelvic pain no greater than 3/10. Patient able to have a BM without pain. By 03/15/2015. Not met; working towards   2. No urinary hesitation by 03/15/2015      Functional limitation(s) remaining requiring continued skilled services:  Pain/strain with BM, urinary urgency and frequency, nocturia, vulvar pain, vaginal spasms with walking and squat     Any changes in plan of treatment: initiate manual therapy for soft tissue restriction of hips and pelvic floor. Initiate biofeedback for pelvic floor down training. Further assess lumbopelvic girdle, hip mobility. Initiate hip strengthening.    Salli Quarry, PT, DPT

## 2014-12-17 ENCOUNTER — Encounter (HOSPITAL_BASED_OUTPATIENT_CLINIC_OR_DEPARTMENT_OTHER): Payer: No Typology Code available for payment source

## 2014-12-22 ENCOUNTER — Ambulatory Visit (HOSPITAL_BASED_OUTPATIENT_CLINIC_OR_DEPARTMENT_OTHER): Payer: No Typology Code available for payment source

## 2014-12-22 DIAGNOSIS — R102 Pelvic and perineal pain: Secondary | ICD-10-CM

## 2014-12-22 DIAGNOSIS — M6281 Muscle weakness (generalized): Secondary | ICD-10-CM

## 2014-12-22 DIAGNOSIS — M25551 Pain in right hip: Secondary | ICD-10-CM

## 2014-12-22 DIAGNOSIS — M25552 Pain in left hip: Secondary | ICD-10-CM

## 2014-12-22 DIAGNOSIS — R3911 Hesitancy of micturition: Secondary | ICD-10-CM

## 2014-12-22 DIAGNOSIS — R293 Abnormal posture: Secondary | ICD-10-CM

## 2014-12-22 NOTE — Progress Notes (Signed)
Physical Therapy Progress Note   Time in: 10:35am          Time out: 11:20am        Duration: 45 minutes  The following identifiers were confirmed with the patient today:name and date of birth  Referring Provider: Jerelyn Charles  Referring provider NPI: [id number]    Today's date: 12/22/2014  Start of Care: 12/03/2014    Number of Visits Since start of care: 3  Reporting Period from 12/22/2014 to 01/21/2015    Subjective/Pain: Kristen Salinas reports to therapy today with her partner, [name]. She reports suprapubic and back pain have returned to previous level. Spasms at vaginal opening have improved, however. She did work on pelvic floor relaxation, but not do home program exercises from initial visit. Continues to have 4/10 pain with light touch and steroid placement each night and notes increased burning after steroid placement this week; she has an appointment with Dr. Jenean Lindau on June 1 and will address this with her then if it is still an issue.    The patient is reporting 8/10 level of pain/percieved severity of symptoms, on a 0-10 Numeric Pain Distress Scale.     Skilled services/interventions provided:    Manual therapy for 25 minutes: soft tissue mobilization to lower abdominals, suprapubic region, bilateral hip adductors, external levator ani and urogenital triangle musculature.     Neuromuscular reeducation for 10 minutes: instruction in pelvic tilts for pelvic positional awareness with decreasing range of motion until in neutral spine. Instructions to move legs (marches, etc) from this neutral position. Muscle energy techniques for right anterior ilium rotation.     Therapeutic exercise for hip strengthening for 10 minutes: Exercises   Supine Hip Adduction Isometric with Ball - 10 Reps - 2 Sets - 5 Hold (sec) - 1x daily - 5x weekly    Beginner Clam - 10 Reps - 2 Sets - 1x daily - 5x weekly   review clams from initial visit, 2 sets of 10 repetitions, 5 times per week.     Patient's response to therapy: good  improvements in suprapubic and back pain since initial session.     Patient education/instruction: verbal instructions and demonstrations were provided for the above exercises    Change in impairments:   Observation: Sway back posture, B knee hyperextension, B heel weight bearing, significant B valgus deformity knees  Impairments:  Range of motion: Lumbar motion full; B hips limited in IR   Muscle Strength: B LEs grossly 4-/5; fair lumbopelvic stabilization with mobility and movement  Sensation: Intact  Reflexes: Intact  Special tests: + pelvic obliquity with R ant ilium rotation  Pelvic Floor Muscle Testing:   Skin integrity: within normal limits   Vaginal vault size: not tested   Sitting Tolerance: within normal limits   Posture: posterior pelvic tilt in sitting   Range of motion/Quality of motion/Recruitment Pattern: Perineal resting level elevated by 50% at baseline. 25% perineal elevation with contraction. nil perineal descent with bulge. Coordination fair, pelvic pain after pelvic floor contraction/lift.   Muscle Strength: Laycock: not tested due to pelvic pain (MMT/Hold in seconds/repetitions at hold/quick)  Sensation: pain with light touch to vestibule 4/10 that improves to 1/10 with sustained hold  Cotton Swab: not tested   Reflexes: Cough: absent, Anal wink: not tested   Palpation: ischiocavernosus, bulbocavernosus, superficial transverse, iliococcygeus, pubococcygeus perineum 4/5 soft tissue restriction with external palpation. Bilateral hip adductors soft tissue restriction 4/5.  Myofascial/abdominal wall assessment: 4/5 soft tissue restriction to abdominal wall and  suprapubic pelvis   Prolapse/Tissue Laxity: not tested   External Anal Sphincter Contraction: not tested       Functional improvement/progress toward each goals:   1. Complete PFM exam: 50% met  2. B hip strength 5/5: Not met; working towards   3. Normal MF mobility abd wall and suprapubic pelvis: 25% met  4. Level pelvis: 75% met  Long term  functional goals/Outcome:   1. Patient able to squat, walk, transfer sit to stand with pelvic pain no greater than 3/10. Not met; working towards   2. Patient able to have a BM without pain. By 03/15/2015. Not met; working towards   3. No urinary hesitation by 03/15/2015. 50% met      Functional limitation(s) remaining requiring continued skilled services:  Pain/strain with BM, urinary urgency and frequency, nocturia, vulvar pain, vaginal spasms with walking and squat     Any changes in plan of treatment: Initiate biofeedback for pelvic floor down training.  resume and progress manual therapy for soft tissue restriction of hips and pelvic floor. Further assess lumbopelvic girdle, hip mobility. Progress hip strengthening.    Salli Quarry, PT, DPT

## 2014-12-24 ENCOUNTER — Encounter (HOSPITAL_BASED_OUTPATIENT_CLINIC_OR_DEPARTMENT_OTHER): Payer: No Typology Code available for payment source

## 2014-12-24 ENCOUNTER — Encounter (INDEPENDENT_AMBULATORY_CARE_PROVIDER_SITE_OTHER): Payer: No Typology Code available for payment source | Admitting: Family

## 2014-12-25 ENCOUNTER — Encounter (INDEPENDENT_AMBULATORY_CARE_PROVIDER_SITE_OTHER): Payer: Self-pay | Admitting: Physician Assistant

## 2014-12-25 ENCOUNTER — Ambulatory Visit (INDEPENDENT_AMBULATORY_CARE_PROVIDER_SITE_OTHER): Payer: No Typology Code available for payment source | Admitting: Physician Assistant

## 2014-12-25 VITALS — BP 116/78 | HR 95 | Temp 99.1°F | Wt 227.8 lb

## 2014-12-25 DIAGNOSIS — Z720 Tobacco use: Secondary | ICD-10-CM

## 2014-12-25 DIAGNOSIS — G4700 Insomnia, unspecified: Secondary | ICD-10-CM

## 2014-12-25 DIAGNOSIS — F419 Anxiety disorder, unspecified: Secondary | ICD-10-CM

## 2014-12-25 MED ORDER — HYDROXYZINE PAMOATE 50 MG OR CAPS
ORAL_CAPSULE | ORAL | Status: DC
Start: 2014-12-25 — End: 2015-04-15

## 2014-12-25 MED ORDER — DULOXETINE HCL 60 MG OR CPEP
60.0000 mg | DELAYED_RELEASE_CAPSULE | Freq: Every day | ORAL | Status: DC
Start: 2014-12-25 — End: 2015-03-25

## 2014-12-25 NOTE — Progress Notes (Signed)
Reason for visit: anxiety and depression follow up, also wants to discuss options to quit smoking  Reviewed eCare status with Patient:  NO    HEALTH MAINTENANCE:  Has the patient had any of these since their last visit?    Cervical screening/PAP: UTD     Mammo: N/A    Colon Screen: N/A    Have you seen a specialist since your last visit: No        HM Due:   Health Maintenance   Topic Date Due   . HIV Screen  1993/03[birth year]25   . Pap Smear  08/30/2015   . Chlamydia Screen  09/18/2015   . Gonorrhea Screen  09/18/2015   . Tetanus Vaccine  12/26/2022   . Influenza Vaccine  Completed   . HPV Vaccine  Completed           Future Appointments  Date Time Provider Department Center   12/26/2014 3:15 PM Yates DecampKrishnamurthy, Shoba, MBBS [facility]   12/31/2014 2:00 PM Soundra Pilonhen, [facility]iffany Ann, [facility]    01/02/2015 8:30 AM Lajuana Carryice, Kathryn N, PT [facility]   01/05/2015 1:45 PM Lajuana Carryice, Kathryn N, PT [facility]   01/12/2015 10:30 AM Lajuana Carryice, Kathryn N, PT [facility]   01/19/2015 8:00 AM Dimple Caseyice, Barrie DunkerKathryn N, PT [facility]

## 2014-12-25 NOTE — Progress Notes (Signed)
Kristen Salinas is a 24 year old female here to discuss the following:    Follow up    Sub:   She reports that since starting on the Cymbalta she has had less episodes of racing thoughts.  She has also started driving and has gotten a job.  She is looking forward to working.    She feels that she is overall doing better.  She would like to continue current medication dose.    She has also started smoking recently which is distressing to her.  She has smoked about 5 packs of cigarettes in the last 3 weeks.  She would like to stop that.    She is also concerned about recent weight gain.    She has been sleeping well.  She has been taking one trazodone which helps her fall asleep and 50 mg of hydroxyzine which helps her to stay asleep.      She plans to continue to work on her mental and physical health.  She would like to consider putting  Nexplanon back in, in about 6 months.      OBJECTIVE:  BP 116/78 mmHg  Pulse 95  Temp(Src) 99.1 F (37.3 C) (Temporal)  Wt 227 lb 12.8 oz (103.329 kg)  SpO2 98%  LMP 12/05/2014  CONSTITUTIONAL/GENERAL:  Appearance:  Well developed, appearing stated age and in no acute distress,  Facial features:     External ears and nose are normal in appearance without significant scars or asymmetry.,  Gait and station:  Normal..  Judgement/insight:  Normal, Mood/affect:  Normal.          ASSESSMENT AND PLAN:  (F41.9) Anxiety  (primary encounter diagnosis)  Plan: DULoxetine HCl 60 MG Oral CAPSULE ENTERIC         COATED PARTICLES, HydrOXYzine Pamoate 50 MG         Oral Cap         (G47.00) Insomnia, unspecified type  Plan: Hydroxyzine - refilled    (Z72.0) Tobacco use  Plan:   Discussed that as she has gone through 5 packs of cigarettes in the past 3 weeks it is most likely that her use is not currently related to physical dependence.  For that reason, nicotine patch would not be helpful.  We discussed Nicorette gum.  We also discussed benefits of starting her new job and the new  routine that would not support the use of continued smoking.    She agrees to continue to work on stopping.    RTC in 3 months for follow up.    Cletis AthensMichelle Lynne Angelic Schnelle, [place]

## 2014-12-25 NOTE — Patient Instructions (Signed)
It was a pleasure to see you in clinic today.                  If you are not yet signed up for eCare, please speak with a team member at the front desk who would happy to assist you with this process.      eCare  enrollment will allow you access to the below benefits   You can make appointments online   View test results / Lab Results   Obtain a copy of our After Visit Summary (a summary of your visit today)     Your Test Results:  If labs were ordered today the results are expected to be available via eCare in about 5 days. If you have an active eCare account, this is how we will notify you of your results.     If you do not have an eCare account then your test results will be mailed to you within about 14 days after your tests are completed. If your physician needs to change your care based on your results or is concerned, you will receive a phone call.     If you have any questions about your test results please schedule an appointment with your provider.    **If it has been more than 2 weeks and you have not received your test results please send our office a message via eCare.    Medication Refills: If you need a prescription refilled, please contact your pharmacy 1 week before your current supply will run out to request the refill.  Contacting your pharmacy is the fastest and safest way to obtain a medication refill.  The pharmacy will notify our office.  Please note, that a minimum of 48 to 72 hours is needed to refill a medication, but refills are usually processed and sent to your pharmacy in about 5 business days.  Please call your pharmacy early to allow enough time to refill before you anticipate running out.  For faster medication refills, you can also schedule an appointment with your provider.    We know you have a choice in where you receive your healthcare and we sincerely thank you for trusting [facility] with your health.

## 2014-12-26 ENCOUNTER — Encounter (INDEPENDENT_AMBULATORY_CARE_PROVIDER_SITE_OTHER): Payer: Self-pay | Admitting: Gastroenterology

## 2014-12-30 ENCOUNTER — Encounter (HOSPITAL_BASED_OUTPATIENT_CLINIC_OR_DEPARTMENT_OTHER): Payer: Self-pay

## 2014-12-31 ENCOUNTER — Telehealth (HOSPITAL_BASED_OUTPATIENT_CLINIC_OR_DEPARTMENT_OTHER): Payer: Self-pay

## 2014-12-31 ENCOUNTER — Encounter (HOSPITAL_BASED_OUTPATIENT_CLINIC_OR_DEPARTMENT_OTHER): Payer: No Typology Code available for payment source

## 2014-12-31 ENCOUNTER — Ambulatory Visit (HOSPITAL_BASED_OUTPATIENT_CLINIC_OR_DEPARTMENT_OTHER)
Payer: No Typology Code available for payment source | Attending: Obstetrics/Gynecology | Admitting: Obstetrics & Gynecology

## 2014-12-31 VITALS — BP 136/89 | HR 105 | Temp 98.4°F | Ht 63.0 in | Wt 225.0 lb

## 2014-12-31 DIAGNOSIS — N949 Unspecified condition associated with female genital organs and menstrual cycle: Secondary | ICD-10-CM

## 2014-12-31 DIAGNOSIS — N761 Subacute and chronic vaginitis: Secondary | ICD-10-CM | POA: Insufficient documentation

## 2014-12-31 DIAGNOSIS — N94810 Vulvar vestibulitis: Secondary | ICD-10-CM | POA: Insufficient documentation

## 2014-12-31 LAB — PR WET MOUNTS INCL PREP VAGINAL CERV/SKIN SPECIMENS, ONSITE
Bacteria: NEGATIVE
Clue Cells: NEGATIVE
Odor: NEGATIVE
Trich: NEGATIVE
WBC: NEGATIVE
Yeast, URN: NEGATIVE

## 2014-12-31 MED ORDER — METRONIDAZOLE 0.75 % VA GEL
1.0000 | VAGINAL | Status: DC
Start: 2015-01-01 — End: 2015-08-04

## 2014-12-31 NOTE — Telephone Encounter (Signed)
CONFIRMED PHONE NUMBER: [phone] option 4  CALLERS FIRST AND LAST NAME: Rino  FACILITY NAME: [facility] TITLE: Pharmacist  CALLERS RELATIONSHIP:OTHER: see above  RETURN CALL: OK to leave detailed message with anyone that answers     [facility] ONLY) Texting is an option for this clinic. If you would like us to use this option, which mobile phone number should we text to if we are unable to reach you?    SUBJECT: General Message   REASON FOR REQUEST: Medication Question    MESSAGE: Rino would like to verify the quantity for metronidazole.  Patient is at the pharmacy waiting.  Please contact Rino.    Thank you

## 2014-12-31 NOTE — Patient Instructions (Addendum)
-   We will plan to follow up on the results of your yeast culture. If the culture is positive, we will electronically send a prescription for fluconazole. If yeast is evidence on culture, you will take 1 pill of fluconazole every 3 day for a total of 3 doses, followed by 1 pill of fluconazole per week.     - Continue nightly triamcinolone ointment.    - Continue metronidazole gel 2 x per week.     - Return to clinic in 4 weeks (at the end of your 8 week course of metronidazole) to assess for ongoing need for metronidazole for BV suppression.     - Continue to track your periods with re-evaluation in 6 months to decide on a plan for menstrual regulation if needed.

## 2014-12-31 NOTE — Telephone Encounter (Signed)
RN returned pharmacist's phone call. Pharmacist states that they do not carry a 45g tube of metronidazole; they only have 75g. RN informed pharmacist that it is OK to dispense 75g tube to pt. This note routed to prescribing MD.
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
## 2015-01-01 ENCOUNTER — Encounter (INDEPENDENT_AMBULATORY_CARE_PROVIDER_SITE_OTHER): Payer: Self-pay | Admitting: Gastroenterology

## 2015-01-01 ENCOUNTER — Ambulatory Visit: Payer: No Typology Code available for payment source | Admitting: Gastroenterology

## 2015-01-01 VITALS — BP 119/70 | HR 81 | Temp 98.3°F | Ht 63.0 in | Wt 231.0 lb

## 2015-01-01 DIAGNOSIS — K5900 Constipation, unspecified: Secondary | ICD-10-CM

## 2015-01-01 DIAGNOSIS — K5909 Other constipation: Secondary | ICD-10-CM

## 2015-01-01 DIAGNOSIS — R12 Heartburn: Secondary | ICD-10-CM

## 2015-01-01 DIAGNOSIS — R1110 Vomiting, unspecified: Secondary | ICD-10-CM

## 2015-01-01 DIAGNOSIS — IMO0001 Reserved for inherently not codable concepts without codable children: Secondary | ICD-10-CM

## 2015-01-01 DIAGNOSIS — R1084 Generalized abdominal pain: Secondary | ICD-10-CM

## 2015-01-01 NOTE — Patient Instructions (Signed)
1.Drink Ginger and Dandelion root tea -2-3 cups daily-Traditional Medicinals.Try Chamomile in evenings-helps with sleep  2.Continue Magnesium Oxide 800mg  at bedtime  3.Take probiotic daily or fermented foods like-Sauerkraut,Kimchi,Miso ,Kefir    Avoid:  1.Beef/Pork  2.Milk products-Milk,Cheese,Creamy foods such as creamy soups ,salad dressings or dips,desserts  3.Fruit juices ,candy,gum dried fruit [raisins,dates etc]  4.Fried ,greasy foods  5.Gluten containing foods [wheat,barley,rye]-Breads,pasta,noodles,muffins,crackers,beer etc  6.Raw  vegetables    Good foods:  1.Chicken/Fish/Turkey  2.Rice,Quinoa,Millet,Buckwheat,Amaranth,Tef,Gluten free oats,Gluten free bread/pasta-in moderation  3.Cooked vegetables-plenty  4.Fruits in small servings-preferably berries  5.Nuts and nut butters-Almond,walnuts,pistachios etc  6.Seeds-Pumpkin,Ground flax,Chia,sesame etc  7.Eggs  8.Gaucamole/Avacado,Hummus  9.Oils: Olive,Sunflower,coconut,ghee  10.Tofu/Tempeh/Miso  11.Milk substitutes-Almond,Coconut,rice and soy milk  12.Sweet potatoes,Yam.  1

## 2015-01-01 NOTE — Progress Notes (Signed)
GASTROENTEROLOGY CLINIC NOTE    LAST CLINIC VISIT: 11/28/14        HISTORY OF PRESENT ILLNESS:  Patient is a 24 year old female who returns for follow-up of chronic constipation, heartburn, dyspepsia and abdominal pain.  Please refer to last GI notes for details.  Patient is had a colonoscopy and upper endoscopy in April both of which were normal including gastric and esophageal biopsies which were normal.  At last visit patient complained of increased heartburn, nausea and regurgitation which I felt was likely because she had discontinued her PPI and I had recommended that she restart omeprazole at 20 mg a day.  Patient states that her nausea and heartburn have improved but she continues to have some intermittent regurgitation after some meals though she hasn't noticed any pattern to it.  She typically regurgitates a small amount of liquid and occasionally some solid food that she's just recently eaten.  Has been taking magnesium oxide 2 tablets that's at bedtime and and says that she didn't now does have one to 3 bowel movements daily but they tend to be hard at times.  She did stop Metamucil and probiotics.  We have discussed a modified 5 diet at last visit but patient states that she has not been following it.  She continues to have some mid and lower abdominal pain but feels that it is significantly improved and thinks that the Cymbalta may be helping.  She is also sleeping better.    PAST MEDICAL & SURGICAL HISTORY:  Active Ambulatory Problems     Diagnosis Date Noted   . Depression 02/19/2013   . Hiatal hernia 02/19/2013   . PTSD (post-traumatic stress disorder) 02/19/2013   . Subacute vaginitis 09/10/2014   . Encounter for surveillance of injectable contraceptive 09/10/2014   . Primary insomnia 09/10/2014   . Abnormal cervical Papanicolaou smear 09/14/2014   . Constipation, unspecified 10/06/2014   . Spasm of muscle 12/03/2014   . Muscle weakness 12/03/2014   . Abnormal posture 12/03/2014   . Bilateral hip  pain 12/15/2014   . Pelvic pain in female 12/15/2014   . Urinary hesitancy 12/15/2014   . Vulvar vestibulitis 12/31/2014     Resolved Ambulatory Problems     Diagnosis Date Noted   . No Resolved Ambulatory Problems     Past Medical History   Diagnosis Date   . Obesity    . Pap smear abnormality of cervix    . Heart burn    . Chest pain    . History of irritable bowel syndrome    . Frequent use of laxatives    . GERD (gastroesophageal reflux disease)    . Indigestion    . Nausea and vomiting        ALL:  Review of patient's allergies indicates:  Allergies   Allergen Reactions   . Prednisone Rash       MEDS:  Outpatient Prescriptions Prior to Visit   Medication Sig Dispense Refill   . CRANBERRY OR      . DULoxetine HCl 60 MG Oral CAPSULE ENTERIC COATED PARTICLES Take 1 capsule (60 mg) by mouth daily. 30 capsule 2   . Fluconazole 150 MG Oral Tab Take 1 tablet (150 mg) by mouth every 3 days. 3 tablet 0   . HydrOXYzine Pamoate 50 MG Oral Cap take one at bedtime for insomnia 30 capsule 2   . Lansoprazole (PREVACID OR) 20 mg twice a day     . Magnesium Oxide 400 MG Oral  Tab Take 2 tablets (800 mg) by mouth daily. 60 tablet 4   . MetroNIDAZOLE 0.75 % Vaginal Gel Place 1 applicatorful into the vagina 2 times a week. Use 2 x weekly for 4 weeks. 45 g 1   . Omeprazole 20 MG Oral CAPSULE DELAYED RELEASE      . Ondansetron 4 MG Oral TABLET DISPERSIBLE      . Probiotic Product (SOLUBLE FIBER/PROBIOTICS OR)      . Psyllium (METAMUCIL OR)      . TraZODone HCl 100 MG Oral Tab Take 1.5 tablets (150 mg) by mouth at bedtime. For Insomnia. 90 tablet 3   . Triamcinolone Acetonide 0.1 % External Ointment Apply 1 application to affected area on genitals at bedtime. 15 g 3     No facility-administered medications prior to visit.       REVIEW OF SYSTEMS:  ROS as documented on the Patient Information Form completed by the patient and this was reviewed with the patient.    SOCIAL HISTORY:  History     Social History   . Marital Status: Single      Spouse Name: N/A   . Number of Children: N/A   . Years of Education: N/A     Occupational History   . Not on file.     Social History Main Topics   . Smoking status: Former Smoker -- 0.50 packs/day for 2 years     Types: Cigarettes   . Smokeless tobacco: Never Used   . Alcohol Use: Yes      Comment: Rare   . Drug Use: No   . Sexual Activity: No      Comment: didn't tolerate Nexplanon after one year.      Other Topics Concern   . Not on file     Social History Narrative           FAMILY HISTORY:    No FH of gastrointestinal diseases or malignancies.    PHYSICAL EXAMINATION:   Filed Vitals:    01/01/15 0749   BP: 119/70   Pulse: 81   Temp: 98.3 F (36.8 C)   TempSrc: Temporal   Height: 5\' 3"  (1.6 m)   Weight: 231 lb (104.781 kg)   SpO2: 99%     GEN: well-kept, well-developed, normal weight, NAD  HEENT: anicteric, atraumatic, non-erythematous OP  NECK: no cervical LAD, supple  CHEST: CTA bilat  CV: nl s1, s2 with no MRG  ABD: +BS, soft, NT, NG, non-tympanic, no hepatosplenomegaly  RECTAL:   JOINT:  No increased erythema or heat  SKIN: no rashes  PSYCH:  Normal judgement and insight  NEURO: A&Ox3, nl gait    LAB  Office Visit on 12/31/2014   Component Date Value Ref Range Status   . Odor 12/31/2014 Neg   Final   . WBC 12/31/2014 Neg   Final   . RBC 12/31/2014 ++   Final   . Bacteria 12/31/2014 Neg   Final   . Clue Cells 12/31/2014 Neg   Final   . Yeast, URN 12/31/2014 Neg   Final   . Epithelial Cells 12/31/2014 +   Final   . Ivery Quale 12/31/2014 Neg   Final   . Special Requests 12/31/2014    Preliminary                    Value:Sensitivities  NOTE: Collection date/time not provided. Result(s) may be compromised since the integrity of the specimen cannot be determined.     Marland Kitchen  Stain For Fungus 12/31/2014 No fungal elements seen   Preliminary   . Special Requests 12/31/2014 NOTE: Collection date/time not provided. Result(s) may be compromised since the integrity of the specimen cannot be determined.   Preliminary   .  Culture 12/31/2014 Culture is being held for further incubation   Preliminary   Office Visit on 11/26/2014   Component Date Value Ref Range Status   . Special Requests 11/26/2014 None   Final   . Stain For Fungus 11/26/2014 No fungal elements seen   Final   . Culture 11/26/2014 No yeast isolated at 3 days   Final   . Odor 11/26/2014 neg   Final   . WBC 11/26/2014 scant   Final   . RBC 11/26/2014 neg   Final   . Bacteria 11/26/2014 few   Final   . Clue Cells 11/26/2014 neg   Final   . Yeast, URN 11/26/2014 neg   Final   . Epithelial Cells 11/26/2014 present   Final   . Trich 11/26/2014 neg   Final   . Hyphae 11/26/2014 neg   Final   Office Visit on 11/24/2014   Component Date Value Ref Range Status   . Special Requests 11/26/2014 No special requests   Final   . Culture 11/26/2014 No Mycoplasma genitalium, Mycoplasma hominis,  Ureaplasma urealyticum, or Ureaplasma parvum DNA detected with 16S rRNA gene amplified probes.   Final   . Culture 11/26/2014    Final                    Value:Analytical sensitivities (genomes):  4  Analytical sensitivities or minimal detection limits are expressed in copies of bacterial or fungal genomic DNA in a single amplification reaction performed on purified DNA.  Sensitivity of detecting microbial DNA from a tissue or fluid sample will vary depending on the organism load in the sample submitted to our lab for testing, pretreatment such as formaldehyde fixation   or staining, or any process that introduces exogenous microorganisms or microbial DNA into the sample submitted for testing.  This test was developed by the Department of Laboratory Medicine, [place] [facility] [place].     . Urine Volume 11/24/2014 174 ml   Final   . Other 11/24/2014 16.7 sec   Final    flow time   . Other 11/24/2014 18.0 ml/s   Final    Qmax   . Other 11/24/2014 10.4 ml/s   Final    Qmean   . Other 11/24/2014 4.8 sec   Final    TQ max   . Other 11/24/2014 16.3 sec   Final    total time   . Volume, URN 11/24/2014  0 ml   Final   Office Visit on 11/21/2014   Component Date Value Ref Range Status   . Color, Urine 11/21/2014 YELLOW   Final   . Clarity, URN 11/21/2014 CLEAR   Final   . Glucose, Urine 11/21/2014 NEGATIVE  NEG mg/dL Final   . Bilirubin, Urine 11/21/2014 NEGATIVE  NEG Final   . Ketones, URN 11/21/2014 NEGATIVE  NEG mg/dL Final   . Specific Gravity, Urine 11/21/2014 1.020  1.005 - 1.030 Final   . Occult Blood, URN 11/21/2014 NEGATIVE  NEG Final   . pH, URN 11/21/2014 5.5  5.0 - 8.0 Final   . Protein 11/21/2014 NEGATIVE  NEG-TRACE mg/dL Final   . Urobilinogen, URN 11/21/2014 0.2  0.2 - 1.0 E.U./dL Final   . Nitrite, URN 11/21/2014 NEGATIVE  NEG Final   . Leukocytes 11/21/2014 NEGATIVE  NEG Final   . Pregnancy (HCG) ([facility]), URN 11/21/2014 NEGATIVE   Final   . INTERNAL CONTROL 11/21/2014 Control Verified   Final   . Special Requests 11/21/2014 none   Final   . Colony Count 11/21/2014    Final                    Value:100  Col/mL     . Culture 11/21/2014    Final                    Value:1+  Gram positive flora     . Pregnancy (HCG), SRM 11/21/2014 1  <6 m[IU]/mL Final    Comment: NEGATIVE: = or <5 mIU/mL   by Beckman DxI Assay calibration traced to the [facility]   5th International Standard for Chorionic Gonadotropin (NIBSC Code 07/364).     Office Visit on 11/19/2014   Component Date Value Ref Range Status   . Special Requests 11/19/2014 None   Final   . Culture 11/19/2014 No growth (<1000 Col/mL)   Final   . Color, URN 11/19/2014 yellow   Final   . Clarity, URN 11/19/2014 clear   Final   . Glucose, Urine 11/19/2014 neg  NEG mg/dL Final   . Bilirubin (Indirect) 11/19/2014 neg  NEG Final   . Ketones, URN 11/19/2014 40  NEG mg/dL Final   . Occult Blood, URN 11/19/2014 tr  NEG Final   . pH, URN ([facility]) 11/19/2014 6.0  5.0 - 8.0 Final   . Protein 11/19/2014 neg  NEG-TRACE mg/dL Final   . Urobilinogen, URN 11/19/2014 0.2  0.2 - 1.0 E.U./dL Final   . Nitrite, URN 11/19/2014 neg  NEG Final   . Leukocytes 11/19/2014 tr  NEG Final   .  Special Requests 11/19/2014 NOTE: Collection date/time not provided. Result(s) may be compromised since the integrity of the specimen cannot be determined.   Final   . Stain For Fungus 11/19/2014 No fungal elements seen   Final   . Culture 11/19/2014 No yeast isolated   Final   . Odor 11/19/2014 neg   Final   . WBC 11/19/2014 few   Final   . RBC 11/19/2014 neg   Final   . Bacteria 11/19/2014 few   Final   . Clue Cells 11/19/2014 neg   Final   . Yeast, URN 11/19/2014 neg   Final   . Epithelial Cells 11/19/2014 3+   Final   . Trich 11/19/2014 neg   Final   . Other 11/19/2014 pH 5   Final   . Hyphae 11/19/2014 neg   Final   Office Visit on 11/05/2014   Component Date Value Ref Range Status   . Odor 11/05/2014 neg   Final   . WBC 11/05/2014 neg   Final   . RBC 11/05/2014 neg   Final   . Bacteria 11/05/2014 neg   Final   . Clue Cells 11/05/2014 neg   Final   . Yeast, URN 11/05/2014 neg   Final   . Epithelial Cells 11/05/2014 3+   Final   . Trich 11/05/2014 neg   Final   . Other 11/05/2014 pH 6   Final   . Hyphae 11/05/2014 possible   Final   . Special Requests 11/05/2014    Final                    Value:Please assess for fluconazole  sensitivity if positive  NOTE: Collection date/time not provided. Result(s) may be compromised since the integrity of the specimen cannot be determined.     . Stain For Fungus 11/05/2014 No fungal elements seen   Final   . Culture 11/05/2014 No yeast isolated at 3 days   Final        RADIOLOGY:  Please refer to HPI    ASSESSMENT/PLAN:  24 year old yo female with 1.  Heartburn and regurgitation likely secondary to a combination of mild gastroesophageal reflux disease, mild gastroparesis and functional at upper GI disease-with the improved symptoms so she continues to have some breakthrough symptoms.  2.  Chronic constipation and lower abdominal pain likely secondary to irritable bowel syndrome.  Plan:1.  Discussed presumed pathophysiology of her upper and lower GI symptoms  2.  Reviewed  modified FODMAPs diet again in detail and made suggestions about how she could incorporated into her daily routine  3.  Continue omeprazole 20 mg daily for now but hopefully we will be able to gradually taper this down once patient's symptoms have improved further  4.  Try herbal teas like Ginger and dandelion root which have prokinetic effects and would help with regurgitation, heartburn and constipation.  5.  Follow-up in 6-8 weeks

## 2015-01-02 ENCOUNTER — Ambulatory Visit: Payer: No Typology Code available for payment source | Attending: Obstetrics/Gynecology

## 2015-01-02 DIAGNOSIS — N761 Subacute and chronic vaginitis: Secondary | ICD-10-CM | POA: Insufficient documentation

## 2015-01-02 DIAGNOSIS — M25551 Pain in right hip: Secondary | ICD-10-CM | POA: Insufficient documentation

## 2015-01-02 DIAGNOSIS — M6281 Muscle weakness (generalized): Secondary | ICD-10-CM | POA: Insufficient documentation

## 2015-01-02 DIAGNOSIS — N94810 Vulvar vestibulitis: Secondary | ICD-10-CM

## 2015-01-02 DIAGNOSIS — R293 Abnormal posture: Secondary | ICD-10-CM

## 2015-01-02 DIAGNOSIS — M25552 Pain in left hip: Secondary | ICD-10-CM | POA: Insufficient documentation

## 2015-01-02 DIAGNOSIS — R3911 Hesitancy of micturition: Secondary | ICD-10-CM | POA: Insufficient documentation

## 2015-01-02 DIAGNOSIS — R102 Pelvic and perineal pain: Secondary | ICD-10-CM | POA: Insufficient documentation

## 2015-01-02 DIAGNOSIS — M62838 Other muscle spasm: Secondary | ICD-10-CM

## 2015-01-02 NOTE — Progress Notes (Signed)
Physical Therapy Progress Note   Time in: 8:40am          Time out: 9:20am        Duration: 40 minutes  The following identifiers were confirmed with the patient today:name and date of birth  Referring Provider: Jerelyn Charles  Referring provider NPI: [id number]    Today's date: 01/02/2015  Start of Care: 12/03/2014    Number of Visits Since start of care: 4  Reporting Period from 01/02/2015 to 02/01/2015    Subjective/Pain: Kristen Salinas reports overall improvement in condition; was a 9/10 at initial visit and now is a 6/10 (in terms of severity of condition). Does report sometimes feels physical therapy provokes symptoms. Tries to relax pelvic floor when sitting down regularly. Fair compliance with home program. Starting a new job next, and she is having training for 2 weeks so she will need to adjust some appointment times.    The patient is reporting 6/10 level of pain/percieved severity of symptoms, on a 0-10 Numeric Pain Distress Scale.     Skilled services/interventions provided:    Manual therapy for 25 minutes: soft tissue mobilization to lower abdominals, suprapubic region, bilateral hip adductors, external levator ani and urogenital triangle musculature. Internal mobilization to superficial transverse perineum, bulbocavernosus, ischiocavernosus, iliococcygeus, obturator internus with sustained holds, strumming, moderate pressure.    Neuromuscular reeducation for 5 minutes: instruction in pelvic tilts for pelvic positional awareness, body mechanics with straight spine for lifting for new job.     Therapeutic exercise for hip stretching for 10 minutes: instruction in supine double knees to chest stretch with pelvic floor relaxation (2 sets of 30 second holds), instruction in supine lumbar rotations (15-20 repetitions).     Patient's response to therapy: good improvements in suprapubic pressure and back pain since initial session.     Patient education/instruction: verbal instructions and demonstrations were  provided for the above exercises    Change in impairments:   Observation: Sway back posture, B knee hyperextension, B heel weight bearing, significant B valgus deformity knees  Impairments:  Range of motion: Lumbar motion full; B hips limited in IR   Muscle Strength: B LEs grossly 4-/5; fair lumbopelvic stabilization with mobility and movement  Sensation: Intact  Reflexes: Intact  Special tests: + pelvic obliquity with R ant ilium rotation  Pelvic Floor Muscle Testing:   Skin integrity: within normal limits   Vaginal vault size: within normal limits   Sitting Tolerance: within normal limits   Posture: posterior pelvic tilt in sitting   Range of motion/Quality of motion/Recruitment Pattern: Perineal resting level elevated by 25% at baseline. 25% perineal elevation with contraction. nil perineal descent with bulge. Coordination fair, pelvic pain after pelvic floor contraction/lift.   Muscle Strength: Laycock: not tested due to pelvic pain (MMT/Hold in seconds/repetitions at hold/quick)  Sensation: pain with light touch to vestibule  Cotton Swab: not tested   Reflexes: Cough: absent, Anal wink: not tested   Palpation: ischiocavernosus, bulbocavernosus, superficial transverse, iliococcygeus, pubococcygeus perineum 4/5 soft tissue restriction with external palpation. Bilateral hip adductors soft tissue restriction 4/5. Ischiocavernosus, bulbocavernosus, superficial transverse perineum, iliococcygeus, pubococcygeus, obturator internus: 4/5 soft tissue restriction bilaterally with internal palpation.  Myofascial/abdominal wall assessment: 3/5 soft tissue restriction to abdominal wall and suprapubic pelvis, 5/5 tissue restriction with skin rolling to suprapubic region  Prolapse/Tissue Laxity: not tested   External Anal Sphincter Contraction: not tested       Functional improvement/progress toward each goals:   1. Complete PFM exam: goal met  2. B hip strength 5/5: Not met; working towards   3. Normal MF mobility abd wall  and suprapubic pelvis: 30% met  4. Level pelvis: 75% met  Long term functional goals/Outcome:   1. Patient able to squat, walk, transfer sit to stand with pelvic pain no greater than 3/10. 25% met   2. Patient able to have a BM without pain. By 03/15/2015. Not met; working towards   3. No urinary hesitation by 03/15/2015. 50% met      Functional limitation(s) remaining requiring continued skilled services:  Pain/strain with BM, urinary urgency and frequency, nocturia, vulvar pain, vaginal spasms with walking and squat     Any changes in plan of treatment: Initiate biofeedback for pelvic floor down training. Resume and progress manual therapy for soft tissue restriction of hips and pelvic floor. Further assess lumbopelvic girdle, hip mobility. Progress hip strengthening.    Salli Quarry, PT, DPT

## 2015-01-02 NOTE — Progress Notes (Signed)
I saw and evaluated the patient. I have reviewed the resident's documentation and agree with it.

## 2015-01-03 LAB — R/O YEAST CULT W/DIRECT EXAM: Stain For Fungus: NONE SEEN

## 2015-01-04 ENCOUNTER — Telehealth (HOSPITAL_BASED_OUTPATIENT_CLINIC_OR_DEPARTMENT_OTHER): Payer: Self-pay

## 2015-01-04 LAB — URINE C/S
Culture: 31000
Culture: >100000

## 2015-01-04 NOTE — Telephone Encounter (Signed)
CONFIRMED PHONE NUMBER: [phone]  CALLERS FIRST AND LAST NAME: Joellyn RuedJessica Herzberg  FACILITY NAME: na TITLE: na  CALLERS RELATIONSHIP:Self  RETURN CALL: General message OK     ([facility] ONLY) Texting is an option for this clinic. If you would like us to use this option, which mobile phone number should we text to if we are unable to reach you?    SUBJECT: General Message   REASON FOR REQUEST: Patient called in she had to cancel her 6/8 and 6/15 she would like to book two appointment on 06/9 and 6/16 please advise. This provider had no template thank you     MESSAGE: see above

## 2015-01-05 ENCOUNTER — Encounter (HOSPITAL_BASED_OUTPATIENT_CLINIC_OR_DEPARTMENT_OTHER): Payer: No Typology Code available for payment source

## 2015-01-05 NOTE — Telephone Encounter (Signed)
Spoke with patient and due to her schedule at work she could not make some of the appointments in June she only kept 06/20 and made another for 07/11.  Closing TE

## 2015-01-07 ENCOUNTER — Other Ambulatory Visit (HOSPITAL_BASED_OUTPATIENT_CLINIC_OR_DEPARTMENT_OTHER): Payer: Self-pay | Admitting: Obstetrics/Gynecology

## 2015-01-07 ENCOUNTER — Encounter (HOSPITAL_BASED_OUTPATIENT_CLINIC_OR_DEPARTMENT_OTHER): Payer: Self-pay | Admitting: Obstetrics/Gynecology

## 2015-01-07 ENCOUNTER — Encounter (HOSPITAL_BASED_OUTPATIENT_CLINIC_OR_DEPARTMENT_OTHER): Payer: No Typology Code available for payment source

## 2015-01-07 DIAGNOSIS — N3000 Acute cystitis without hematuria: Secondary | ICD-10-CM

## 2015-01-07 MED ORDER — SULFAMETHOXAZOLE-TRIMETHOPRIM 800-160 MG OR TABS
1.0000 | ORAL_TABLET | Freq: Two times a day (BID) | ORAL | Status: DC
Start: 2015-01-07 — End: 2015-01-25

## 2015-01-12 ENCOUNTER — Encounter (HOSPITAL_BASED_OUTPATIENT_CLINIC_OR_DEPARTMENT_OTHER): Payer: No Typology Code available for payment source

## 2015-01-18 ENCOUNTER — Telehealth (HOSPITAL_BASED_OUTPATIENT_CLINIC_OR_DEPARTMENT_OTHER): Payer: Self-pay

## 2015-01-18 NOTE — Telephone Encounter (Signed)
CONFIRMED PHONE NUMBER:   Telephone Information:   Home Phone [phone]   Work Phone [phone]   Mobile [phone]       CALLERS FIRST AND LAST NAME: Kristen Salinas  FACILITY NAME: na TITLE: na  CALLERS RELATIONSHIP:Self  RETURN CALL: Detailed message on voicemail only     [facility] ONLY) Texting is an option for this clinic. If you would like [place] to use this option, which mobile phone number should we text to if we are unable to reach you?    SUBJECT: Cancellation/Reschedule   REASON FOR CANCELLATION: Family/work conflict  RESCHEDULED: NO

## 2015-01-19 ENCOUNTER — Ambulatory Visit (HOSPITAL_BASED_OUTPATIENT_CLINIC_OR_DEPARTMENT_OTHER): Payer: No Typology Code available for payment source

## 2015-01-19 ENCOUNTER — Encounter (HOSPITAL_BASED_OUTPATIENT_CLINIC_OR_DEPARTMENT_OTHER): Payer: No Typology Code available for payment source

## 2015-01-19 DIAGNOSIS — M62838 Other muscle spasm: Secondary | ICD-10-CM

## 2015-01-19 DIAGNOSIS — R293 Abnormal posture: Secondary | ICD-10-CM

## 2015-01-19 DIAGNOSIS — R102 Pelvic and perineal pain: Secondary | ICD-10-CM

## 2015-01-19 DIAGNOSIS — M25552 Pain in left hip: Secondary | ICD-10-CM

## 2015-01-19 DIAGNOSIS — M25551 Pain in right hip: Secondary | ICD-10-CM

## 2015-01-19 DIAGNOSIS — M6281 Muscle weakness (generalized): Secondary | ICD-10-CM

## 2015-01-19 DIAGNOSIS — N94810 Vulvar vestibulitis: Secondary | ICD-10-CM

## 2015-01-19 DIAGNOSIS — N761 Subacute and chronic vaginitis: Secondary | ICD-10-CM

## 2015-01-19 DIAGNOSIS — R3911 Hesitancy of micturition: Secondary | ICD-10-CM

## 2015-01-19 NOTE — Telephone Encounter (Signed)
Noted. Closing TE.

## 2015-01-19 NOTE — Progress Notes (Signed)
Physical Therapy Progress Note   Time in: 5:45pm          Time out: 6:30pm        Duration: 45 minutes  The following identifiers were confirmed with the patient today:name and date of birth  Referring Provider: Jerelyn Charles  Referring provider NPI: [id number]    Today's date: 01/19/2015  Start of Care: 12/03/2014    Number of Visits Since start of care: 5  Reporting Period from 01/19/2015 to 02/18/2015    Subjective/Pain: Kristen Salinas reports continued 6/10 severity of condition. Had a UTI and was prescribed antibiotics. Feels like infection is still present or a yeast infection' urethra is feeling "swollen" and she is having some burning with urination. She is seeing physician next week to check on infection status. 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 Felt ok after last treatment session. She also reports onset of of bilateral upper extremity numbness; concerned by this. It is worse at night and when gripping steering wheel.    The patient is reporting 6/10 level of pain/percieved severity of symptoms, on a 0-10 Numeric Pain Distress Scale.     Skilled services/interventions provided:    Manual therapy for 25 minutes: joint mobilization to lumbosacral region, grade III/IV bilaterally. Lumbar extensors soft tissue mobilization: moderate pressure. Posterior to anterior unilateral joint mobilization to lumbar spine.     Therapeutic exercise for hip stretching for 20 minutes:   1. Thomas stretch bilaterally, 2 times per day, 7 days per week, 30-60 second hold, 2 repetitions each.  2. Continue with bilateral hip adduction isometric  3. Continue with bilateral clam (sidelying)    Patient's response to therapy: some increased symptoms of vaginal spasm and pelvic pain, hip pain/back pain after prolonged sitting and UTI since last visit. Pelvic floor manual therapy held today due to potential continued  infection.    Patient education/instruction: verbal instructions and demonstrations were provided for the above exercises, written instructions provided and reviewed with pictures. General postural instruction, gentle and light stretching to upper extremity; recommend patient contact physician regarding upper extremity numbness symptoms.    Change in impairments:   Observation: Sway back posture, B knee hyperextension, B heel weight bearing, significant B valgus deformity knees  Impairments:  Range of motion: Lumbar motion full; B hips limited in IR   Muscle Strength: B LEs grossly 4-/5; fair lumbopelvic stabilization with mobility and movement  Joint mobility: hypomobile through lumbar spine  Special tests: + pelvic obliquity with R ant ilium rotation  Pelvic Floor Muscle Testing:  Posture: posterior pelvic tilt in sitting   Range of motion/Quality of motion/Recruitment Pattern: Perineal resting level elevated by 25% at baseline. 25% perineal elevation with contraction. nil perineal descent with bulge. Coordination fair, pelvic pain after pelvic floor contraction/lift.   Muscle Strength: Laycock: not tested due to pelvic pain (MMT/Hold in seconds/repetitions at hold/quick)  Sensation: pain with light touch to vestibule  Reflexes: Cough: absent, Anal wink: not tested   Palpation: ischiocavernosus, bulbocavernosus, superficial transverse, iliococcygeus, pubococcygeus perineum 4/5 soft tissue restriction with external palpation. Bilateral hip adductors soft tissue restriction 4/5. Ischiocavernosus, bulbocavernosus, superficial transverse perineum, iliococcygeus, pubococcygeus, obturator internus: 4/5 soft tissue restriction bilaterally with internal palpation. (pelvic floor not assessed today due to potential continued infection)  Myofascial/abdominal wall assessment: 3/5 soft tissue restriction to abdominal wall and suprapubic pelvis, 5/5 tissue restriction with skin  rolling to suprapubic region  Prolapse/Tissue  Laxity: not tested   External Anal Sphincter Contraction: not tested       Functional improvement/progress toward each goals:   1. Complete PFM exam: goal met  2. B hip strength 5/5: Not met; working towards   3. Normal MF mobility abd wall and suprapubic pelvis: 30% met  4. Level pelvis: 75% met  Long term functional goals/Outcome:   1. Patient able to squat, walk, transfer sit to stand with pelvic pain no greater than 3/10. 25% met   2. Patient able to have a BM without pain. By 03/15/2015. Not met; working towards   3. No urinary hesitation by 03/15/2015. 50% met  4. Absent vaginal spasms by 03/15/2015. Not met; working towards     Functional limitation(s) remaining requiring continued skilled services:  Pain/strain with BM, urinary urgency and frequency, nocturia, vulvar pain, vaginal spasms with walking and squat     Any changes in plan of treatment: Initiate biofeedback for pelvic floor down training. Resume and progress manual therapy for soft tissue restriction of hips and pelvic floor. Further assess lumbopelvic girdle, hip mobility. Progress hip strengthening.    Salli Quarry, PT, DPT

## 2015-01-22 ENCOUNTER — Ambulatory Visit (INDEPENDENT_AMBULATORY_CARE_PROVIDER_SITE_OTHER): Payer: No Typology Code available for payment source | Admitting: Physician Assistant

## 2015-01-22 ENCOUNTER — Encounter (INDEPENDENT_AMBULATORY_CARE_PROVIDER_SITE_OTHER): Payer: Self-pay | Admitting: Physician Assistant

## 2015-01-22 VITALS — BP 123/79 | HR 89 | Temp 99.7°F | Resp 14 | Wt 227.4 lb

## 2015-01-22 DIAGNOSIS — M2550 Pain in unspecified joint: Secondary | ICD-10-CM

## 2015-01-22 DIAGNOSIS — IMO0001 Reserved for inherently not codable concepts without codable children: Secondary | ICD-10-CM

## 2015-01-22 DIAGNOSIS — R209 Unspecified disturbances of skin sensation: Secondary | ICD-10-CM

## 2015-01-22 LAB — CBC, DIFF
% Basophils: 0 %
% Eosinophils: 1 %
% Immature Granulocytes: 0 %
% Lymphocytes: 28 %
% Monocytes: 6 %
% Neutrophils: 65 %
Absolute Eosinophil Count: 0.06 10*3/uL (ref 0.00–0.50)
Absolute Lymphocyte Count: 2.31 10*3/uL (ref 1.00–4.80)
Basophils: 0.02 10*3/uL (ref 0.00–0.20)
Hematocrit: 41 % (ref 36–45)
Hemoglobin: 14.2 g/dL (ref 11.5–15.5)
Immature Granulocytes: 0.01 10*3/uL (ref 0.00–0.05)
MCH: 31 pg (ref 27.3–33.6)
MCHC: 34.5 g/dL (ref 32.2–36.5)
MCV: 90 fL (ref 81–98)
Monocytes: 0.46 10*3/uL (ref 0.00–0.80)
Neutrophils: 5.46 10*3/uL (ref 1.80–7.00)
Platelet Count: 193 10*3/uL (ref 150–400)
RBC: 4.58 10*6/uL (ref 3.80–5.00)
RDW-CV: 13.4 % (ref 11.6–14.4)
WBC: 8.32 10*3/uL (ref 4.3–10.0)

## 2015-01-22 LAB — COMPREHENSIVE METABOLIC PANEL
ALT (GPT): 21 U/L (ref 7–33)
AST (GOT): 20 U/L (ref 9–38)
Albumin: 4.1 g/dL (ref 3.5–5.2)
Alkaline Phosphatase (Total): 62 U/L (ref 26–98)
Anion Gap: 6 (ref 4–12)
Bilirubin (Total): 0.4 mg/dL (ref 0.2–1.3)
Calcium: 9.6 mg/dL (ref 8.9–10.2)
Carbon Dioxide, Total: 26 meq/L (ref 22–32)
Chloride: 106 meq/L (ref 98–108)
Creatinine: 0.68 mg/dL (ref 0.38–1.02)
GFR, Calc, African American: >60 mL/min (ref >59)
GFR, Calc, European American: >60 mL/min (ref >59)
Glucose: 83 mg/dL (ref 62–125)
Potassium: 4.2 meq/L (ref 3.6–5.2)
Protein (Total): 6.9 g/dL (ref 6.0–8.2)
Sodium: 138 meq/L (ref 135–145)
Urea Nitrogen: 12 mg/dL (ref 8–21)

## 2015-01-22 LAB — FOLATE & VITAMIN B12
Folate, SRM: 10.4 ng/mL (ref >5.8)
Vitamin B12 (Cobalamin): 332 pg/mL (ref 180–914)

## 2015-01-22 LAB — PR A1C RAPID, ONSITE: Hemoglobin A1C: 5 % (ref 4.0–6.0)

## 2015-01-22 LAB — FERRITIN: Ferritin: 36 ng/mL (ref 10–180)

## 2015-01-22 LAB — SED RATE: Erythrocyte Sedimentation Rate: 7 mm/h (ref 0–20)

## 2015-01-22 LAB — RHEUMATOID FACTOR: Rheumatoid Factor: <10 [IU]/mL (ref <15)

## 2015-01-22 NOTE — Progress Notes (Signed)
Kristen Salinas is a 24 year old female here to discuss the following:    (R20.9) Paresthesias/numbness   in both hands from elbows to hands.    symptoms started 3 weeks ago. It is bilateral, symmetrical and constant. It is worse at night and sometimes wakes her up from sleeping.   She denies trauma to back or neck.  Denies new pillows/beds/sleeping arrangements.    Pain right now:: 4/10.  It is worse with gripping.  Symptoms start in wrist and radiate to ulnar side of hand and proximally to elbow.    She also reports pain in both knees and feels that feet occasionally go numb.  She endorses increase in thirst and urination.    Denies fever  denies red/swollen/hot joints  denies skin changes.    So far she has not attempted any treatment.      Patient Active Problem List   Diagnosis   . Depression   . Hiatal hernia   . PTSD (post-traumatic stress disorder)   . Subacute vaginitis   . Encounter for surveillance of injectable contraceptive   . Primary insomnia   . Abnormal cervical Papanicolaou smear   . Constipation, unspecified   . Spasm of muscle   . Muscle weakness   . Abnormal posture   . Bilateral hip pain   . Pelvic pain in female   . Urinary hesitancy   . Vulvar vestibulitis     Current Outpatient Prescriptions   Medication Sig Dispense Refill   . CRANBERRY OR      . DULoxetine HCl 60 MG Oral CAPSULE ENTERIC COATED PARTICLES Take 1 capsule (60 mg) by mouth daily. 30 capsule 2   . Fluconazole 150 MG Oral Tab Take 1 tablet (150 mg) by mouth every 3 days. 3 tablet 0   . HydrOXYzine Pamoate 50 MG Oral Cap take one at bedtime for insomnia 30 capsule 2   . Lansoprazole (PREVACID OR) 20 mg twice a day     . Magnesium Oxide 400 MG Oral Tab Take 2 tablets (800 mg) by mouth daily. 60 tablet 4   . MetroNIDAZOLE 0.75 % Vaginal Gel Place 1 applicatorful into the vagina 2 times a week. Use 2 x weekly for 4 weeks. 45 g 1   . Omeprazole 20 MG Oral CAPSULE DELAYED RELEASE      . Ondansetron 4 MG Oral TABLET DISPERSIBLE       . Probiotic Product (SOLUBLE FIBER/PROBIOTICS OR)      . Psyllium (METAMUCIL OR)      . TraZODone HCl 100 MG Oral Tab Take 1.5 tablets (150 mg) by mouth at bedtime. For Insomnia. 90 tablet 3   . Triamcinolone Acetonide 0.1 % External Ointment Apply 1 application to affected area on genitals at bedtime. 15 g 3     No current facility-administered medications for this visit.       OBJECTIVE:  BP 123/79 mmHg  Pulse 89  Temp(Src) 99.7 F (37.6 C) (Temporal)  Resp 14  Wt 227 lb 6.4 oz (103.148 kg)  SpO2 97%  LMP 01/16/2015  CONSTITUTIONAL/GENERAL:  Appearance:  Well developed, appearing stated age and in no acute distress,  Facial features:     External ears and nose are normal in appearance without significant scars or asymmetry.,  Gait and station:  Normal..  Judgement/insight:  Normal, Mood/affect:  Normal.     elbows, wrists, fingers: no swelling, erythema or deformity is noted.  ROM is wnl in all joints. Grip strength is  5/5 bilat.  resisted flexion/extension of wrist, elbows, fingers is symmetrical and 5/5 bilat.    elbows: no tenderness to palpation    Neck: no adenopathy  ROM is wnl  There is mild tenderness to palpation of c-spine.        ASSESSMENT AND PLAN:  (R20.9) Paresthesias/numbness  (primary encounter diagnosis)  Plan: SED RATE, RHEUMATOID FACTOR, ANA REFLEX         COMPREHENSIVE PANEL, A1C RAPID, ONSITE, CBC,         DIFF, COMPREHENSIVE METABOLIC PANEL, FERRITIN,         FOLATE & VITAMIN B12          (M25.50) Arthralgia, unspecified joint  Plan: SED RATE, RHEUMATOID FACTOR, ANA REFLEX         COMPREHENSIVE PANEL, A1C RAPID, ONSITE, CBC,         DIFF, COMPREHENSIVE METABOLIC PANEL, FERRITIN,         FOLATE & VITAMIN B12      WE WILL PROCEED AS INDICATED BASED ON LAB RESULTS AND / OR PROGRESSION OF SYMPTOMS.    PATIENT AGREES TO RTC PRN    If above are normal, will proceed with trial of nsaids and possible x-ray.              Cletis [place], [place]

## 2015-01-22 NOTE — Patient Instructions (Signed)
It was a pleasure to see you in clinic today.                  If you are not yet signed up for eCare, please speak with a team member at the front desk who would happy to assist you with this process.      eCare  enrollment will allow you access to the below benefits   You can make appointments online   View test results / Lab Results   Obtain a copy of our After Visit Summary (a summary of your visit today)     Your Test Results:  If labs were ordered today the results are expected to be available via eCare in about 5 days. If you have an active eCare account, this is how we will notify you of your results.     If you do not have an eCare account then your test results will be mailed to you within about 14 days after your tests are completed. If your physician needs to change your care based on your results or is concerned, you will receive a phone call.     If you have any questions about your test results please schedule an appointment with your provider.    **If it has been more than 2 weeks and you have not received your test results please send our office a message via eCare.    Medication Refills: If you need a prescription refilled, please contact your pharmacy 1 week before your current supply will run out to request the refill.  Contacting your pharmacy is the fastest and safest way to obtain a medication refill.  The pharmacy will notify our office.  Please note, that a minimum of 48 to 72 hours is needed to refill a medication, but refills are usually processed and sent to your pharmacy in about 5 business days.  Please call your pharmacy early to allow enough time to refill before you anticipate running out.  For faster medication refills, you can also schedule an appointment with your provider.    We know you have a choice in where you receive your healthcare and we sincerely thank you for trusting [facility] with your health.

## 2015-01-22 NOTE — Progress Notes (Signed)
Reason for visit: Numbness in hands that worsens at night but happens all day long. Started in right and now is in left as well. Sx X3 weeks  Reviewed eCare status with Patient:  YES    HEALTH MAINTENANCE:  Has the patient had any of these since their last visit?    Cervical screening/PAP: Due 08/2015     Mammo: N/A    Colon Screen: N/A    Have you seen a specialist since your last visit: No        HM Due:   Health Maintenance   Topic Date Due   . Pap Smear  08/30/2015   . Chlamydia Screen  09/18/2015   . Gonorrhea Screen  09/18/2015   . Tetanus Vaccine  12/26/2022   . Influenza Vaccine  Completed   . HIV Screen  Completed   . HPV Vaccine  Completed           Future Appointments  Date Time Provider Department Center   01/22/2015 11:45 AM Cletis [place], [place] [facility]   01/28/2015 2:20 PM [id number]    02/09/2015 8:00 AM Rice, Barrie Dunker, PT [facility]

## 2015-01-23 ENCOUNTER — Telehealth (INDEPENDENT_AMBULATORY_CARE_PROVIDER_SITE_OTHER): Payer: Self-pay | Admitting: Physician Assistant

## 2015-01-23 LAB — ANA REFLEX COMPREHENSIVE PANEL
Ana Interpretation Comment 1: NEGATIVE
Ana Screen By Multiplex: NEGATIVE
Antibodies to Nuclear Ags by IF (ANA): NEGATIVE

## 2015-01-23 NOTE — Telephone Encounter (Signed)
Kristen Salinas, patient had a further question when I called her folllowing up. Patient wanted to know where she should go from here? Xray ? referall to PT for hand?     Please advise.     Routing to [employer]

## 2015-01-23 NOTE — Telephone Encounter (Signed)
Post Visit Call Back    Date of visit: 01/22/2015  Provider seen in clinic:  Sharyn Creamer, PA-C  Reason for Visit: medication review    Outcome: (please document outcome of call - did you reach patient and what were their comments or concerns if any?  Or did you leave a voicemail?)  Patient wanted to know where she should go from here? Xray ? referall to PT for hand?      If patient answers:    Hi, this is (your name) calling from the [facility] in [place], part of (providers name) care team. (providers name) asked that I call and follow-up in regards to your visit with [place] yesterday and make sure that you got all of your concerns and questions answered.    Is this a good time?    Do you have any additional questions or concerns regarding your visit?  Patient wanted to know where she should go from here? Xray ? referall to PT for hand?        Is there anything else we can help with?  Patient wanted to know where she should go from here? Xray ? referall to PT for hand?        Do you wish to schedule a follow up visit?  no    Please give [place] a call back if there is anything additional we can help you with at [phone], option [phone].  Thank you for choosing [facility] for your care.      If leaving a voicemail:    Hi, This is (your name) from (providers name) office.  I wanted to follow-up about you appointment yesterday and make sure that you got all of your concerns and questions addressed.  Please give [place] a call back if there is anything additional we can help you with at [phone], option 8.  Thank you for choosing [facility] for your care.

## 2015-01-27 ENCOUNTER — Ambulatory Visit (HOSPITAL_BASED_OUTPATIENT_CLINIC_OR_DEPARTMENT_OTHER): Payer: No Typology Code available for payment source

## 2015-01-27 DIAGNOSIS — M25551 Pain in right hip: Secondary | ICD-10-CM

## 2015-01-27 DIAGNOSIS — R3911 Hesitancy of micturition: Secondary | ICD-10-CM

## 2015-01-27 DIAGNOSIS — N94810 Vulvar vestibulitis: Secondary | ICD-10-CM

## 2015-01-27 DIAGNOSIS — M62838 Other muscle spasm: Secondary | ICD-10-CM

## 2015-01-27 DIAGNOSIS — R102 Pelvic and perineal pain: Secondary | ICD-10-CM

## 2015-01-27 DIAGNOSIS — M25552 Pain in left hip: Secondary | ICD-10-CM

## 2015-01-27 DIAGNOSIS — M6281 Muscle weakness (generalized): Secondary | ICD-10-CM

## 2015-01-27 DIAGNOSIS — R293 Abnormal posture: Secondary | ICD-10-CM

## 2015-01-27 NOTE — Progress Notes (Signed)
Physical Therapy Progress Note   Time in: 4:15pm          Time out: 5:35pm        Duration: 50 minutes  The following identifiers were confirmed with the patient today:name and date of birth  Referring Provider: Jerelyn Charles  Referring provider NPI: [id number]    Today's date: 01/27/2015  Start of Care: 12/03/2014    Number of Visits Since start of care: 6  Reporting Period from 01/27/2015 to 02/26/2015    Subjective/Pain: [place] at work by slipping on ice today; right hip is hurting. Vaginal spasms a little better, but she is having clotting and bleeding as well as cramping more deeply. Going to physician tomorrow. No pain with bowel movement any longer. Intermittent urinary hesitancy. Upper extremity are still bothering her; she is supposed to get an x-ray. Increased pain with steroid placement at vaginal opening.    The patient is reporting 6/10 level of pain/percieved severity of symptoms, on a 0-10 Numeric Pain Distress Scale.     Skilled services/interventions provided:    Manual therapy for 40 minutes: joint mobilization to right lumbosacral region, grade III/IV bilaterally. Lumbar extensors and quadratus lumborum soft tissue mobilization: moderate pressure, sustained holds and strumming. Soft tissue mobilization to bilateral hip adductors, externally to urogenital triangle and levator ani, intra-vaginally to ischiocavernosus, bulbocavernosus, compressor urethrae, pubococcygeus, iliococcygeus: moderate pressure, sustained holds and strumming    Therapeutic exercise for 5 minutes:  1. Lumbar rotations in hook lying  Continue with previous home program exercises.    Neuromuscular reeducation for 5 minutes: postural education, slight depression/retraction of scapula and long through back of neck for more neutral spine position.    Patient's response to therapy: continued pain with co-morbidities of menstrual cramping, recent infection, fall today.     Patient education/instruction: verbal instructions and  demonstrations were provided for the above exercises, written instructions provided and reviewed with pictures.     Change in impairments:   Observation: Sway back posture, B knee hyperextension, B heel weight bearing, significant B valgus deformity knees  Impairments:  Range of motion: Lumbar motion full; B hips limited in IR   Muscle Strength: B LEs grossly 4-/5; fair lumbopelvic stabilization with mobility and movement  Joint mobility: hypomobile through lumbar spine  Special tests: right ilium up slip, Quadratus lumborum 4/5 soft tissue restriction  Pelvic Floor Muscle Testing:  Posture: posterior pelvic tilt in sitting   Range of motion/Quality of motion/Recruitment Pattern: Perineal resting level elevated by 25% at baseline. 25% perineal elevation with contraction. nil perineal descent with bulge. Coordination fair, pelvic pain after pelvic floor contraction/lift.   Muscle Strength: [place]: not tested due to pelvic pain (MMT/Hold in seconds/repetitions at hold/quick)  Sensation: pain with light touch to vestibule  Reflexes: Cough: absent, Anal wink: not tested   Palpation: ischiocavernosus, bulbocavernosus, superficial transverse, iliococcygeus, pubococcygeus perineum 4/5 soft tissue restriction with external palpation. Bilateral hip adductors soft tissue restriction 3/5. Ischiocavernosus, bulbocavernosus, superficial transverse perineum, iliococcygeus, pubococcygeus, obturator internus: 4/5 soft tissue restriction bilaterally with internal palpation.    Myofascial/abdominal wall assessment: 3/5 soft tissue restriction to abdominal wall and suprapubic pelvis, 5/5 tissue restriction with skin rolling to suprapubic region  Prolapse/Tissue Laxity: not tested   External Anal Sphincter Contraction: not tested       Functional improvement/progress toward each goals:   1. Complete PFM exam: goal met  2. B hip strength 5/5: Not met; working towards   3. Normal MF mobility abd wall and suprapubic  pelvis: 30% met  4.  Level pelvis: 75% met  Long term functional goals/Outcome:   1. Patient able to squat, walk, transfer sit to stand with pelvic pain no greater than 3/10. 25% met   2. Patient able to have a BM without pain. By 03/15/2015. Goal met  3. No urinary hesitation by 03/15/2015. 50% met  4. Absent vaginal spasms by 03/15/2015. Not met; working towards     Functional limitation(s) remaining requiring continued skilled services:  urinary urgency and frequency, nocturia, vulvar pain, vaginal spasms with walking and squat     Any changes in plan of treatment: Initiate biofeedback for pelvic floor down training. Resume and progress manual therapy for soft tissue restriction of hips and pelvic floor. Further assess lumbopelvic girdle, hip mobility. Progress hip strengthening.    Salli Quarry, PT, DPT

## 2015-01-28 ENCOUNTER — Telehealth (HOSPITAL_BASED_OUTPATIENT_CLINIC_OR_DEPARTMENT_OTHER): Payer: Self-pay | Admitting: Obstetrics/Gynecology

## 2015-01-28 ENCOUNTER — Ambulatory Visit (HOSPITAL_BASED_OUTPATIENT_CLINIC_OR_DEPARTMENT_OTHER): Payer: No Typology Code available for payment source | Attending: Obstetrics/Gynecology

## 2015-01-28 VITALS — BP 130/82 | HR 97 | Temp 98.6°F | Ht 63.0 in | Wt 226.0 lb

## 2015-01-28 DIAGNOSIS — N761 Subacute and chronic vaginitis: Secondary | ICD-10-CM | POA: Insufficient documentation

## 2015-01-28 DIAGNOSIS — N94810 Vulvar vestibulitis: Secondary | ICD-10-CM | POA: Insufficient documentation

## 2015-01-28 DIAGNOSIS — R102 Pelvic and perineal pain: Secondary | ICD-10-CM | POA: Insufficient documentation

## 2015-01-28 LAB — PR ALL POTASSIUM HYDROXIDE PREPARATIONS

## 2015-01-28 LAB — PR U/A NONAUTO DIPSTICK ONLY, ONSITE
Glucose, Urine: NEGATIVE mg/dL
Ketones, URN: NEGATIVE mg/dL
Leukocytes: NEGATIVE
Nitrite, URN: NEGATIVE
Protein: NEGATIVE mg/dL
Urobilinogen, URN: 0.2 E.U./dL (ref 0.2–1.0)
pH, URN (UWNC): 6 (ref 5.0–8.0)

## 2015-01-28 LAB — PR WET MOUNTS INCL PREP VAGINAL CERV/SKIN SPECIMENS, ONSITE

## 2015-01-28 MED ORDER — IBUPROFEN 600 MG OR TABS
ORAL_TABLET | ORAL | Status: AC
Start: 2015-01-28 — End: ?

## 2015-01-28 MED ORDER — CLOBETASOL PROPIONATE 0.05 % EX OINT
TOPICAL_OINTMENT | CUTANEOUS | Status: DC
Start: 2015-01-28 — End: 2015-08-04

## 2015-01-28 NOTE — Telephone Encounter (Signed)
CONFIRMED PHONE NUMBER: [phone] option 4  CALLERS FIRST AND LAST NAME: Rino  FACILITY NAME: [facility] TITLE: Pharmacist  CALLERS RELATIONSHIP:OTHER: Pharmacist  RETURN CALL: Detailed message on voicemail only      SUBJECT: Medication Management   REASON FOR REQUEST: Medication Management    MEDICATION(S): Ibuprofen 600 MG Oral Tab  MEDICATION(S)  NEEDED BY: asap  ADDITIONAL INFORMATION: No directions in the prescription. Please call and assist. Thank you.

## 2015-01-28 NOTE — Patient Instructions (Signed)
Stop the triamcinolone ointment, start the clobetasol nightly.  Pea sized amount   To the area just outside the hymen    Continue with the flagyl gel twice a week    Use ibuprofen, 600 mg every 6 hours with food x 1 -2 weeks  Okay to add tylenol with it.  NOT okay to use aleve and ibuprofen together.  Use one or the other.    IF you use aleve, take two pills every 12 hours.      Start the medroxyprogesterone pills your other provider gave  You and take one a day for 10 days.  You will have a period when those pills are done.  Keep track of your periods/ bleeding days.

## 2015-01-28 NOTE — Progress Notes (Signed)
BP 130/82 mmHg  Pulse 97  Temp(Src) 98.6 F (37 C) (Temporal)  Ht 5\' 3"  (1.6 m)  Wt 226 lb (102.513 kg)  BMI 40.04 kg/m2  LMP 01/16/2015  The system could not send a message to the transcriptionist.  Please contact an administrator for assistance.  The encounter provider does not exist or is not valid for this encounter.

## 2015-01-29 ENCOUNTER — Encounter (HOSPITAL_BASED_OUTPATIENT_CLINIC_OR_DEPARTMENT_OTHER): Payer: Self-pay | Admitting: Obstetrics/Gynecology

## 2015-01-29 ENCOUNTER — Other Ambulatory Visit (HOSPITAL_BASED_OUTPATIENT_CLINIC_OR_DEPARTMENT_OTHER): Payer: Self-pay | Admitting: Obstetrics/Gynecology

## 2015-01-29 DIAGNOSIS — N3090 Cystitis, unspecified without hematuria: Secondary | ICD-10-CM

## 2015-01-29 MED ORDER — NITROFURANTOIN MONOHYD MACRO 100 MG OR CAPS
100.0000 mg | ORAL_CAPSULE | Freq: Two times a day (BID) | ORAL | Status: AC
Start: 2015-01-29 — End: 2015-02-03

## 2015-01-29 NOTE — Telephone Encounter (Signed)
Reviewed MD note (which transcription was not available) and called pharmacy to clarify that Ibuprofen (600 mg) should be Q 6-8 hrs prn pain.  Fwd update to Dr Dolphus JennyEckert as Lorain ChildesFYI.

## 2015-01-30 LAB — URINE C/S
Culture: 20000
Culture: >100000

## 2015-01-30 NOTE — Progress Notes (Signed)
Kristen Salinas, Kristen Salinas          [id number]          01/28/2015      This is a 24 year old female who presents to me today for followup, but also complaining acutely of significant dysmenorrhea.    HISTORY OF PRESENT ILLNESS     The patient has been followed by me for vestibulitis as well as chronic BV, and she has been trying to go without any type of hormonal contraception to see where her baseline menstrual cycle is.    The patient was placed on Depo-Provera as a teen and essentially has been on hormonal contraception.  Her reproductive life, she did have regular periods before 15.  She stopped her Nexplanon in late 2015 and got a Depo-Provera shot in January 2016.  She reports that for the last 6 weeks, she has had almost daily bleeding.  She did have one 2-week period without bleeding.  She has been waiting to see if her menstrual cycles would ensue.  However, the last 2 weeks she has developed significant menstrual cramps.  They are in the lower abdomen and also on the low back and it can make it very hard for her to sleep or work.  She has been using Aleve, but not quite at therapeutic doses, and this has not been helpful to her.    The patient is working now and has a more regular schedule.  She says the schedule is helping her, but that is one of the reasons why the menstrual cramps have been so challenging.  She reports occasional chills, but no distinct fever.  No dysuria or burning, although when she has a full bladder her menstrual cramps can worsen.    The patient is also under my care for vestibulitis.  She has been using triamcinolone nightly to her vestibule.  Since her last visit,shet reports the tenderness has not improved at all, that it is quite tender to touch when she places the medication.  She is also undergoing pelvic floor physical therapy for her vestibulitis and vaginismus.    The patient is not sexually active currently with her husband.  They have decided to avoid intercourse at this  point as a matter of choice.    PHYSICAL EXAMINATION     The patient actually has a fairly bright affect.  Her blood pressure is 130/82, pulse is 87.  Recent lab work done revealed a hemoglobin of 14 last week.  On physical examination, she has menstrual blood present.  Her vestibule is still quite tender to Q-tip.  I am not able to assess the color because of the menstrual flow.  On bimanual exam, her uterus is slightly tender to palpation, but her abdomen is very soft.  It is normal size.    Reviewing ultrasound from March 16, she had a normal uterus and ovaries with a stripe of 4.6 mm and no polyps or fibroids.  The dip urine here today is without leukocytes or nitrites.    Wet mount today revealed many red blood cells, normal-appearing squamous cells without evidence of bacterial vaginosis, and no white blood cells.    ASSESSMENT     1. Anovulatory, likely still secondary to Depo-Provera.  Still not sure what her baseline ovulatory status is but very frustrated with her bleeding.  The patient will go ahead and take the 10 mg a day x10 days of Provera that was given to her by her primary care provider.  We  discussed that this will stop her bleeding but that she will have withdrawal bleed after that, and then we will judge from there.  We also discussed the use of low-dose oral contraceptives, which the patient may want to consider.  Even though she had originally not wanted any hormones in her body she is very frustrated at this point and may want to move to low-dose contraceptives.  We will reassess that.  2. Chronic bacterial vaginitis actually is much better now, continue on the metronidazole twice a week.  3. Vestibulitis, still with significant tenderness.  Changed from triamcinolone to clobetasol today and encouraged to continue on with physical therapy.  4. Significant dysmenorrhea.  Described the need to use around the clock prescription dose nonsteroidals and that could add Tylenol to it.  The patient  wishes to try ibuprofen and was given a prescription for 600 mg every 6 hours to take with food, and can add Tylenol to that.  5. Yeast culture was sent since the patient is on MetroGel prophylaxis, which can increase yeast.  The patient will return to the clinic in 6 weeks.

## 2015-01-31 ENCOUNTER — Telehealth (INDEPENDENT_AMBULATORY_CARE_PROVIDER_SITE_OTHER): Payer: Self-pay | Admitting: Physician Assistant

## 2015-01-31 ENCOUNTER — Telehealth (INDEPENDENT_AMBULATORY_CARE_PROVIDER_SITE_OTHER): Payer: Self-pay | Admitting: Family Medicine

## 2015-01-31 DIAGNOSIS — F5101 Primary insomnia: Secondary | ICD-10-CM

## 2015-01-31 MED ORDER — TRAZODONE HCL 100 MG OR TABS
150.0000 mg | ORAL_TABLET | Freq: Every evening | ORAL | Status: DC
Start: 2015-01-31 — End: 2015-01-31

## 2015-01-31 MED ORDER — TRAZODONE HCL 100 MG OR TABS
100.0000 mg | ORAL_TABLET | Freq: Every evening | ORAL | Status: DC
Start: 2015-01-31 — End: 2015-02-03

## 2015-01-31 NOTE — Telephone Encounter (Signed)
Patient called in to check on status of medication. She states that she needs the medication by today. Please call and confirm when prescription has been filled. Thank you.

## 2015-01-31 NOTE — Telephone Encounter (Signed)
Called [facility] and gave a verbal order on pts trazodone Rx. Verbal order for 1 100mg  tablet a day and a 30 day supply.  Pharmacist stated that he does not need a Rx as the verbal order from Dr Dareen PianoAnderson is good enough.  Let pt know and also told her to make a follow up appointment with her PCP before she runs out.    Routing to Dr Dareen PianoAnderson for an SYSCOFYI  Closing TE

## 2015-01-31 NOTE — Telephone Encounter (Signed)
faxed

## 2015-01-31 NOTE — Addendum Note (Signed)
Addended by: Cecelia ByarsANDERSON, Cole Klugh JEAN on: 01/31/2015 04:35 PM     Modules accepted: Orders

## 2015-01-31 NOTE — Telephone Encounter (Signed)
Pt reports she called at 9:45 this am for refill  Out of Trazodone  Needs urgently    Refill would have gone to [facility], no request reached clinic    Pt is here now in lobby asking for urgent refill since she is out of medications    Checked chart    Pt got #90 + 3 rf in Feb, if taking the prescribed 1.5 per night, should still have refills    MA to confirm dose she is taking

## 2015-01-31 NOTE — Telephone Encounter (Signed)
CONFIRMED PHONE NUMBER: [phone]  CALLERS FIRST AND LAST NAME: Kristen BridgemanJessica Nicole Salinas  FACILITY NAME: na TITLE: na  CALLERS RELATIONSHIP:Self  RETURN CALL: Detailed message on voicemail only     [facility] ONLY) Texting is an option for this clinic. If you would like us to use this option, which mobile phone number should we text to if we are unable to reach you?    SUBJECT: Refill Request        REASON FOR REQUEST:Refill Request    MEDICATION(S): TraZODone HCI 100 MG   MEDICATION (S) NEEDED BY: Today  PRESCRIBING PROVIDER:S. Diddee  PHARMACY NAME AND LOCATION: [facility]   [address] [place]   PHARMACY PHONE AND FAX NUMBER: [phone]  ADDITIONAL INFORMATION: patient stated she is not able to sleep without medication.

## 2015-02-01 LAB — R/O YEAST CULT W/DIRECT EXAM: Stain For Fungus: NONE SEEN

## 2015-02-03 ENCOUNTER — Other Ambulatory Visit: Payer: Self-pay | Admitting: Physician Assistant

## 2015-02-03 DIAGNOSIS — F5101 Primary insomnia: Secondary | ICD-10-CM

## 2015-02-03 NOTE — Telephone Encounter (Signed)
Pharmacy is requesting for Trazodone  (90 days supply)

## 2015-02-04 ENCOUNTER — Other Ambulatory Visit: Payer: Self-pay | Admitting: Gastroenterology

## 2015-02-04 DIAGNOSIS — K219 Gastro-esophageal reflux disease without esophagitis: Secondary | ICD-10-CM

## 2015-02-04 MED ORDER — OMEPRAZOLE 20 MG OR CPDR
20.0000 mg | DELAYED_RELEASE_CAPSULE | Freq: Every day | ORAL | Status: DC
Start: 2015-02-04 — End: 2015-08-04

## 2015-02-04 NOTE — Telephone Encounter (Signed)
01/01/15 visit:  Continue omeprazole 20 mg daily for now but hopefully we will be able to gradually taper this down once patient's symptoms have improved further  4. Try herbal teas like Ginger and dandelion root which have prokinetic effects and would help with regurgitation, heartburn and constipation.  5. Follow-up in 6-8 weeks

## 2015-02-04 NOTE — Telephone Encounter (Signed)
This medication is outside of the Refill Center's protocols. Please sign and close the encounter if you approve: trazodone 100 mg #90    Please see telephone encounter 01/31/15.    If this medication is denied please have your staff inform the patient and schedule an appointment if necessary.

## 2015-02-05 ENCOUNTER — Ambulatory Visit (INDEPENDENT_AMBULATORY_CARE_PROVIDER_SITE_OTHER): Payer: Self-pay | Admitting: Physician Assistant

## 2015-02-05 ENCOUNTER — Ambulatory Visit (INDEPENDENT_AMBULATORY_CARE_PROVIDER_SITE_OTHER): Payer: No Typology Code available for payment source | Admitting: Family Medicine

## 2015-02-05 ENCOUNTER — Encounter (INDEPENDENT_AMBULATORY_CARE_PROVIDER_SITE_OTHER): Payer: Self-pay | Admitting: Family Medicine

## 2015-02-05 VITALS — BP 122/78 | HR 91 | Temp 98.8°F | Wt 227.0 lb

## 2015-02-05 DIAGNOSIS — R200 Anesthesia of skin: Secondary | ICD-10-CM

## 2015-02-05 DIAGNOSIS — R208 Other disturbances of skin sensation: Secondary | ICD-10-CM

## 2015-02-05 MED ORDER — GABAPENTIN 100 MG OR CAPS
100.0000 mg | ORAL_CAPSULE | Freq: Every evening | ORAL | Status: DC
Start: 2015-02-05 — End: 2015-05-11

## 2015-02-05 NOTE — Telephone Encounter (Signed)
Protocol: NEUROLOGIC DEFICIT-ADULT-[place]  Negative: Difficult to awaken or acting confused (e.g., disoriented, slurred speech)  Negative: New neurologic deficit that is present NOW, sudden onset of ANY of the following: * Weakness of the face, arm, or leg on one side of the body * Numbness of the face, arm, or leg on one side of the body * Loss of speech or garbled speech  Negative: Sounds like a life-threatening emergency to the triager  Negative: Confusion, disorientation, or hallucinations is the main symptom  Negative: Dizziness is the main symptom  Negative: Followed a head injury within last 3 days  Negative: Headache (with neurologic deficit)  Negative: Can't use hand normally (e.g., hold a glass of water)  Negative: Can't walk or can barely walk  Negative: Back pain with numbness (loss of sensation) in groin or rectal area  Negative: Unable to urinate (or only a few drops) and bladder feels very full  Negative: Loss of control of bowel or bladder (i.e., incontinence) of new onset  Negative: Patient sounds very sick or weak to the triager  Negative: Neurologic deficit that was brief (now gone), ANY of the following: * Weakness of the face, arm, or leg on one side of the body * Numbness of the face, arm, or leg on one side of the body * Loss of speech or garbled speech  Negative: Neurologic deficit of gradual onset, ANY of the following: * Weakness of the face, arm, or leg on one side of the body * Numbness of the face, arm, or leg on one side of the body * Loss of speech or garbled speech  Negative: Bells palsy suspected (i.e., weakness only one side of the face, developing over hours to days, no other symptoms)  Negative: Tingling (e.g., pins and needles) of the face, arm or leg on one side of the body, that is  present now  Affirmative: Neck pain (with neurologic deficit)  Disposition of See Today in Office  suggested.  Patient states that she has recently been having numbness of bilateral hands and arms.   Now having numbness of bilateral feet and ankles with neck pain.  Headache present intermittently for the past week.  Patient agrees to keep 1330 appointment today with Dr. Eulas Postead-Williams and to call back if symptoms worsen.

## 2015-02-05 NOTE — Telephone Encounter (Addendum)
CONFIRMED PHONE NUMBER: [phone]  CALLERS FIRST AND LAST NAME: Kristen BridgemanJessica Nicole Salinas  FACILITY NAME: na TITLE: na  CALLERS RELATIONSHIP:Self  RETURN CALL: Detailed message on voicemail only     [facility] ONLY) Texting is an option for this clinic. If you would like us to use this option, which mobile phone number should we text to if we are unable to reach you?    SUBJECT: General Message   REASON FOR REQUEST: urgent symptom     MESSAGE: arm and hand numbness and pain has gotten a lot worse, spreading to feet. Scheduled with Read-Williams for 1:30 today 02/05/15.

## 2015-02-05 NOTE — Progress Notes (Signed)
Reason for visit: hands and arms numb   Reviewed eCare status with Patient:  NO    HEALTH MAINTENANCE:  Has the patient had any of these since their last visit?    Cervical screening/PAP: N/A     Mammo: N/A    Colon Screen: N/A    Have you seen a specialist since your last visit: No        HM Due: no hm due. cc  Health Maintenance   Topic Date Due   . Influenza Vaccine (1) 04/02/2015   . Pap Smear  08/30/2015   . Chlamydia Screen  09/18/2015   . Gonorrhea Screen  09/18/2015   . Tetanus Vaccine  12/26/2022   . HIV Screen  Completed   . HPV Vaccine  Completed           Future Appointments  Date Time Provider Department Center   02/05/2015 1:30 PM Read-Williams, Colvin CaroliPatricia Gale, MD [facility]   02/09/2015 8:00 AM Lajuana Carryice, Kathryn N, PT [facility]   03/02/2015 8:00 AM Lajuana Carryice, Kathryn N, PT [facility]   03/09/2015 8:00 AM Lajuana Carryice, Kathryn N, PT [facility]   03/09/2015 10:40 AM Neale BurlyEckert, Linda F, [facility]    03/16/2015 8:00 AM Dimple Caseyice, Barrie DunkerKathryn N, PT [facility]

## 2015-02-05 NOTE — Progress Notes (Signed)
Subjective:  Kristen Salinas is a 24 year old Female here with chief complaint of 1)numbness and pain in both legs and hands; worse in left foot; just started to work again [employer]; gets better on her days off; wakes her up multiple times a night; having a hard time holding on to a steering wheel; patient having a difficult time working due to the pain that she is undergoing      Past Medical History   Diagnosis Date   . Obesity    . Pap smear abnormality of cervix    . Heart burn    . Chest pain    . History of irritable bowel syndrome    . Frequent use of laxatives    . GERD (gastroesophageal reflux disease)    . Indigestion    . Nausea and vomiting    . Vulvar vestibulitis 11/26/2014     Past Surgical History   Procedure Laterality Date   . No prior surgeries     . Colonoscopy stoma dx including collj spec spx     . Esophagogastroduodenoscopy       Patient Active Problem List   Diagnosis   . Depression   . Hiatal hernia   . PTSD (post-traumatic stress disorder)   . Subacute vaginitis   . Encounter for surveillance of injectable contraceptive   . Primary insomnia   . Abnormal cervical Papanicolaou smear   . Constipation, unspecified   . Spasm of muscle   . Muscle weakness   . Abnormal posture   . Bilateral hip pain   . Pelvic pain in female   . Urinary hesitancy   . Vulvar vestibulitis     Current Outpatient Prescriptions   Medication Sig Dispense Refill   . Clobetasol Propionate 0.05 % External Ointment Use pea sized amount to vestibule nightly x 6 weeks 30 g 2   . CRANBERRY OR      . DULoxetine HCl 60 MG Oral CAPSULE ENTERIC COATED PARTICLES Take 1 capsule (60 mg) by mouth daily. 30 capsule 2   . Fluconazole 150 MG Oral Tab Take 1 tablet (150 mg) by mouth every 3 days. 3 tablet 0   . HydrOXYzine Pamoate 50 MG Oral Cap take one at bedtime for insomnia 30 capsule 2   . Ibuprofen 600 MG Oral Tab For pain/inflammation. Take with food. 100 tablet 2   . Lansoprazole (PREVACID OR) 20 mg twice a day        . Magnesium Oxide 400 MG Oral Tab Take 2 tablets (800 mg) by mouth daily. 60 tablet 4   . MetroNIDAZOLE 0.75 % Vaginal Gel Place 1 applicatorful into the vagina 2 times a week. Use 2 x weekly for 4 weeks. 45 g 1   . Omeprazole 20 MG Oral CAPSULE DELAYED RELEASE Take 1 capsule (20 mg) by mouth daily on an empty stomach. 30 capsule 1   . Ondansetron 4 MG Oral TABLET DISPERSIBLE      . Probiotic Product (SOLUBLE FIBER/PROBIOTICS OR)      . Psyllium (METAMUCIL OR)      . TraZODone HCl 100 MG Oral Tab Take 1 tablet (100 mg) by mouth at bedtime. For Insomnia. 30 tablet 0   . Triamcinolone Acetonide 0.1 % External Ointment Apply 1 application to affected area on genitals at bedtime. 15 g 3     No current facility-administered medications for this visit.     History     Social History   .  Marital Status: Single     Spouse Name: N/A   . Number of Children: N/A   . Years of Education: N/A     Occupational History   . Not on file.     Social History Main Topics   . Smoking status: Current Every Day Smoker -- 0.25 packs/day for 2 years     Types: Cigarettes   . Smokeless tobacco: Never Used   . Alcohol Use: Yes      Comment: Rare   . Drug Use: No   . Sexual Activity: No      Comment: didn't tolerate Nexplanon after one year.      Other Topics Concern   . Not on file     Social History Narrative     Allergies-Prednisone  ROS: Review of Systems:  Constitutional: As noted in HPI above    Cardiovascular: Negative    Respiratory: Negative    Gastrointestinal: Negative   Genitourinary: Negative   Musculoskeletal: As noted in HPI above   Neurological: As noted in HPI above   Psychiatric: Negative    OBJECTIVE:     BP 122/78 mmHg  Pulse 91  Temp(Src) 98.8 F (37.1 C) (Temporal)  Wt 227 lb (102.967 kg)  SpO2 98%  LMP 01/16/2015  General appearance*: Color is normal, Weight appears heavy  for height.  and No acute distress  Affect*: Judgement/insight normal, Mood/affect flat affect, Orientation oriented to time, person and place  and Recent/remote Memory normal  Right hand: Negative Tinel's; negative Phalen's; no swelling noted; no change in sensation to light touch  Right hand x-ray: No abnormalities noted;reviewed with patient and discussed findings; This is a preliminary report and the xrays will be reviewed by the radiologist  Left hand: Negative Tinel's; negative Phalen's; no swelling noted; no change in sensation to light touch  Left hand x-ray: No abnormalities noted; reviewed with patient and discussed findings; This is a preliminary report and the xrays will be reviewed by the radiologist  Cervical x-ray: No abnormalities noted; reviewed with patient and discussed findings; This is a preliminary report and the xrays will be reviewed by the radiologist  Left foot: No visible abnormalities; range of motion intact  IMPRESSION:  (R20.0) Numbness of right hand  (primary encounter diagnosis)  Plan: 1) X-RAY HAND 3+ VW RIGHT ordered  2) XR C SPINE 2-3 VIEWS ordered  3) Gabapentin 100 MG Oral  Cap started  4) REFERRAL TO EM[facility] ordered to determine the etiology of the numbness and pain in both hands    Lab work done previously  (R20.8) Numbness of left hand  Plan: 1) X-RAY HAND 3+ VW LEFT ordered  2) XR C SPINE 2-3 VIEWS ordered  3) Gabapentin 100 MG Oral  Cap started  4) REFERRAL TO EMG ordered to determine the etiology of the numbness and pain in both hands    Return to clinic as needed for follow-up  Patient also given a note to be off work for 1 week    I spent a total time of 30 minutes face-to-face with the patient, of which more than 50% was spent counseling as outlined in this note.

## 2015-02-05 NOTE — Telephone Encounter (Signed)
[facility] to assist due to urgent symptoms.     Pt does have appointment today at 1:30PM.

## 2015-02-05 NOTE — Patient Instructions (Signed)
It was a pleasure to see you in clinic today.                  If you are not yet signed up for eCare, please speak with a team member at the front desk who would happy to assist you with this process.      eCare  enrollment will allow you access to the below benefits   You can make appointments online   View test results / Lab Results   Obtain a copy of our After Visit Summary (a summary of your visit today)     Your Test Results:  If labs were ordered today the results are expected to be available via eCare in about 5 days. If you have an active eCare account, this is how we will notify you of your results.     If you do not have an eCare account then your test results will be mailed to you within about 14 days after your tests are completed. If your physician needs to change your care based on your results or is concerned, you will receive a phone call.     If you have any questions about your test results please schedule an appointment with your provider.    **If it has been more than 2 weeks and you have not received your test results please send our office a message via eCare.    Medication Refills: If you need a prescription refilled, please contact your pharmacy 1 week before your current supply will run out to request the refill.  Contacting your pharmacy is the fastest and safest way to obtain a medication refill.  The pharmacy will notify our office.  Please note, that a minimum of 48 to 72 hours is needed to refill a medication, but refills are usually processed and sent to your pharmacy in about 5 business days.  Please call your pharmacy early to allow enough time to refill before you anticipate running out.  For faster medication refills, you can also schedule an appointment with your provider.    We know you have a choice in where you receive your healthcare and we sincerely thank you for trusting [facility] with your health.

## 2015-02-06 ENCOUNTER — Telehealth (INDEPENDENT_AMBULATORY_CARE_PROVIDER_SITE_OTHER): Payer: Self-pay | Admitting: Physical Medicine & Rehabilitation

## 2015-02-06 NOTE — Telephone Encounter (Signed)
CONFIRMED PHONE NUMBER: [phone]  CALLERS FIRST AND LAST NAME: Janae BridgemanJessica Nicole Engen    FACILITY NAME: n/a TITLE: n/a  CALLERS RELATIONSHIP:Self  RETURN CALL: General message OK     ([facility] ONLY) Texting is an option for this clinic. If you would like us to use this option, which mobile phone number should we text to if we are unable to reach you?    SUBJECT: Appointment Request   REASON FOR REQUEST: Patient call    REQUEST APPOINTMENT WITH: rehabilitation medicine provider  SYMPTOMS: bilateral numbness, ahnds  REFERRING PROVIDER: Dr.Read Mayford KnifeWilliams  REQUESTED DATE: asap  REQUESTED TIME:   UNABLE TO APPOINT: Other: no scheduling instructions

## 2015-02-07 ENCOUNTER — Encounter (INDEPENDENT_AMBULATORY_CARE_PROVIDER_SITE_OTHER): Payer: Self-pay | Admitting: Family Medicine

## 2015-02-09 ENCOUNTER — Ambulatory Visit: Payer: No Typology Code available for payment source | Attending: Obstetrics/Gynecology

## 2015-02-09 DIAGNOSIS — K5900 Constipation, unspecified: Secondary | ICD-10-CM | POA: Insufficient documentation

## 2015-02-09 DIAGNOSIS — R293 Abnormal posture: Secondary | ICD-10-CM

## 2015-02-09 DIAGNOSIS — R102 Pelvic and perineal pain: Secondary | ICD-10-CM | POA: Insufficient documentation

## 2015-02-09 DIAGNOSIS — M62838 Other muscle spasm: Secondary | ICD-10-CM | POA: Insufficient documentation

## 2015-02-09 DIAGNOSIS — R3911 Hesitancy of micturition: Secondary | ICD-10-CM

## 2015-02-09 DIAGNOSIS — N94810 Vulvar vestibulitis: Secondary | ICD-10-CM

## 2015-02-09 NOTE — Telephone Encounter (Signed)
Patient requesting to speak to someone today regarding referral to see Dr. Michaell Cowingdderson.  Please call [phone], OK for detailed message on VM

## 2015-02-09 NOTE — Telephone Encounter (Signed)
Routing to pss

## 2015-02-09 NOTE — Progress Notes (Signed)
Physical Therapy Progress Note   Time in: 8am          Time out: 8:50am        Duration: 50 minutes  The following identifiers were confirmed with the patient today:name and date of birth  Referring Provider: Jerelyn Charles  Referring provider NPI: [id number]    Today's date: 02/09/2015  Start of Care: 12/03/2014    Number of Visits Since start of care: 7  Reporting Period from 02/09/2015 to 03/11/2015    Subjective/Pain: Saw gynecologist; took medication to help stop bleeding, changed topical cream to clobetasol which she feels is helping but is burning upon application. Started Gabapentin for her upper extremity numbness symptoms and supposed to start physical therapy as well. She has not been doing clam exercises, home program as much. Vaginal spasms decreased to 5/10 in severity and don't last as long when they come on.    The patient is reporting 5/10 level of pain/percieved severity of symptoms, on a 0-10 Numeric Pain Distress Scale.     Skilled services/interventions provided:    Manual therapy for 40 minutes: Soft tissue mobilization to bilateral hip adductors, externally to urogenital triangle and levator ani, intra-vaginally to ischiocavernosus, bulbocavernosus, compressor urethrae, pubococcygeus, iliococcygeus, obturator internus: moderate pressure, sustained holds and strumming, tender point release.    Therapeutic exercise for 10 minutes:  1. Clamshells, 2 sets of 10 repetitions, 1 times per day.  2. Pelvic floor relaxation in sitting; no kegel      Patient's response to therapy: improved vaginal spasms. Continued pelvic floor tension with internal palpation.    Patient education/instruction: verbal instructions and demonstrations were provided for the above exercises     Change in impairments:   Observation: Sway back posture, B knee hyperextension, B heel weight bearing, significant B valgus deformity knees  Impairments:  Range of motion: Lumbar motion full; B hips limited in IR   Muscle Strength: B  LEs grossly 4-/5; fair lumbopelvic stabilization with mobility and movement  Joint mobility: hypomobile through lumbar spine  Special tests: right ilium up slip, Quadratus lumborum 4/5 soft tissue restriction  Pelvic Floor Muscle Testing:  Observation: redness around vestibule bilaterally  Posture: posterior pelvic tilt in sitting   Range of motion/Quality of motion/Recruitment Pattern: Perineal resting level elevated by 25% at baseline. 25% perineal elevation with contraction. nil perineal descent with bulge. Coordination fair, pelvic pain after pelvic floor contraction/lift.   Muscle Strength: Laycock: not tested due to pelvic pain (MMT/Hold in seconds/repetitions at hold/quick)  Sensation: minimal pain with light touch to vestibule  Reflexes: Cough: absent, Anal wink: not tested   Palpation: ischiocavernosus, bulbocavernosus, superficial transverse, iliococcygeus, pubococcygeus perineum 4/5 soft tissue restriction with external palpation. Bilateral hip adductors soft tissue restriction 1/5. Ischiocavernosus, bulbocavernosus, superficial transverse perineum, iliococcygeus, pubococcygeus, obturator internus: 4/5 soft tissue restriction bilaterally with internal palpation.    Myofascial/abdominal wall assessment: 3/5 soft tissue restriction to abdominal wall and suprapubic pelvis, 5/5 tissue restriction with skin rolling to suprapubic region  Prolapse/Tissue Laxity: not tested   External Anal Sphincter Contraction: not tested       Functional improvement/progress toward each goals:   1. Complete PFM exam: goal met  2. B hip strength 5/5: Not met; working towards   3. Normal MF mobility abd wall and suprapubic pelvis: 30% met  4. Level pelvis: 80% met  Long term functional goals/Outcome:   1. Patient able to squat, walk, transfer sit to stand with pelvic pain no greater than 3/10. 25% met  2. Patient able to have a BM without pain. By 03/15/2015. Goal met  3. No urinary hesitation by 03/15/2015. 50% met  4. Absent  vaginal spasms by 03/15/2015. 40% met  Functional limitation(s) remaining requiring continued skilled services:  urinary urgency and frequency, nocturia, vulvar pain, vaginal spasms with walking and squat     Any changes in plan of treatment: Initiate biofeedback for pelvic floor down training. Resume and progress manual therapy for soft tissue restriction of hips and pelvic floor. Further assess lumbopelvic girdle, hip mobility. Progress hip strengthening.    Salli Quarry, PT, DPT

## 2015-02-10 NOTE — Telephone Encounter (Signed)
Call and left message to have patient call me back.

## 2015-02-11 ENCOUNTER — Ambulatory Visit
Payer: No Typology Code available for payment source | Attending: Family Medicine | Admitting: Physical Medicine & Rehabilitation

## 2015-02-11 ENCOUNTER — Encounter (INDEPENDENT_AMBULATORY_CARE_PROVIDER_SITE_OTHER): Payer: Self-pay | Admitting: Physical Medicine & Rehabilitation

## 2015-02-11 DIAGNOSIS — G5603 Carpal tunnel syndrome, bilateral upper limbs: Secondary | ICD-10-CM

## 2015-02-11 DIAGNOSIS — G5602 Carpal tunnel syndrome, left upper limb: Secondary | ICD-10-CM

## 2015-02-11 DIAGNOSIS — G5601 Carpal tunnel syndrome, right upper limb: Secondary | ICD-10-CM

## 2015-02-11 NOTE — Patient Instructions (Signed)
Shanda BumpsJessica,  Thank you for your visit today.  Return to clinic as needed  Many thanks,      Kyndall Amero R. Michaell Cowingdderson, MD

## 2015-02-11 NOTE — Progress Notes (Signed)
Electrodiagnostis Studies      Please see scanned report    Abnormal study.    R carpal tunnel syndrome of Moderate to Severe severity  No active denervation. Mild evidence of reinnervation.    L Carpal tunnel syndrome of mild severity    No ulnar nerve neuropathy bilaterally    Many thanks,    Shaquoya Cosper R. Michaell Cowingdderson, MD  [facility]      Please see scanned report      Many thanks,    Lively Haberman R. Michaell Cowingdderson, MD  [facility]

## 2015-02-12 ENCOUNTER — Ambulatory Visit (INDEPENDENT_AMBULATORY_CARE_PROVIDER_SITE_OTHER): Payer: No Typology Code available for payment source | Admitting: Physician Assistant

## 2015-02-12 ENCOUNTER — Encounter (INDEPENDENT_AMBULATORY_CARE_PROVIDER_SITE_OTHER): Payer: Self-pay | Admitting: Physician Assistant

## 2015-02-12 ENCOUNTER — Telehealth (INDEPENDENT_AMBULATORY_CARE_PROVIDER_SITE_OTHER): Payer: Self-pay

## 2015-02-12 VITALS — BP 130/81 | HR 97 | Temp 98.6°F | Resp 13 | Ht 63.0 in | Wt 230.2 lb

## 2015-02-12 DIAGNOSIS — G5602 Carpal tunnel syndrome, left upper limb: Secondary | ICD-10-CM

## 2015-02-12 DIAGNOSIS — G5603 Carpal tunnel syndrome, bilateral upper limbs: Secondary | ICD-10-CM

## 2015-02-12 DIAGNOSIS — G5601 Carpal tunnel syndrome, right upper limb: Secondary | ICD-10-CM

## 2015-02-12 NOTE — Progress Notes (Signed)
Kristen Salinas is a 24 year old female here to discuss the following:    bilateral wrist pain.    She saw rehab medicine yesterday and was diagnosed with bilateral carpal tunnel syndrome. She was told that she will likely need carpal tunnel surgery if she wants to continue her job.      She also wonders if PT would help , and maybe this would negate the need for surgery.          OBJECTIVE:  BP 130/81 mmHg  Pulse 97  Temp(Src) 98.6 F (37 C) (Temporal)  Resp 13  Ht 5\' 3"  (1.6 m)  Wt 230 lb 3.2 oz (104.418 kg)  BMI 40.79 kg/m2  SpO2 100%  LMP 02/08/2015  General: healthy, alert, no distress      ASSESSMENT AND PLAN:  (G56.01,  G56.02) Bilateral carpal tunnel syndrome  (primary encounter diagnosis)  Plan: REFERRAL TO ORTHOPEDICS       to discuss hand surgery vs other treatment options          Cletis AthensMichelle Lynne Aspasia Rude, PA

## 2015-02-12 NOTE — Patient Instructions (Signed)
It was a pleasure to see you in clinic today.                  If you are not yet signed up for eCare, please speak with a team member at the front desk who would happy to assist you with this process.      eCare  enrollment will allow you access to the below benefits   You can make appointments online   View test results / Lab Results   Obtain a copy of our After Visit Summary (a summary of your visit today)     Your Test Results:  If labs were ordered today the results are expected to be available via eCare in about 5 days. If you have an active eCare account, this is how we will notify you of your results.     If you do not have an eCare account then your test results will be mailed to you within about 14 days after your tests are completed. If your physician needs to change your care based on your results or is concerned, you will receive a phone call.     If you have any questions about your test results please schedule an appointment with your provider.    **If it has been more than 2 weeks and you have not received your test results please send our office a message via eCare.    Medication Refills: If you need a prescription refilled, please contact your pharmacy 1 week before your current supply will run out to request the refill.  Contacting your pharmacy is the fastest and safest way to obtain a medication refill.  The pharmacy will notify our office.  Please note, that a minimum of 48 to 72 hours is needed to refill a medication, but refills are usually processed and sent to your pharmacy in about 5 business days.  Please call your pharmacy early to allow enough time to refill before you anticipate running out.  For faster medication refills, you can also schedule an appointment with your provider.    We know you have a choice in where you receive your healthcare and we sincerely thank you for trusting [facility] with your health.

## 2015-02-12 NOTE — Progress Notes (Signed)
Reason for visit: follow up on hand   Reviewed eCare status with Patient:  NO activated     HEALTH MAINTENANCE:  Has the patient had any of these since their last visit?    Cervical screening/PAP: UTD     Mammo: N/A    Colon Screen: N/A    Have you seen a specialist since your last visit: No        HM Due:   Health Maintenance   Topic Date Due   . Influenza Vaccine (1) 04/02/2015   . Pap Smear  08/30/2015   . Chlamydia Screen  09/18/2015   . Gonorrhea Screen  09/18/2015   . Tetanus Vaccine  12/26/2022   . HIV Screen  Completed   . HPV Vaccine  Completed           Future Appointments  Date Time Provider Department Center   02/12/2015 11:30 AM Cletis AthensVierra, Michelle Lynne, [facility]   03/02/2015 8:00 AM Lajuana Carryice, Kathryn N, PT [facility]   03/09/2015 8:00 AM Lajuana Carryice, Kathryn N, PT [facility]   03/09/2015 10:40 AM Neale BurlyEckert, Linda F, MD [facility]    03/16/2015 8:00 AM Dimple Caseyice, Barrie DunkerKathryn N, PT [facility]

## 2015-02-12 NOTE — Telephone Encounter (Signed)
CONFIRMED PHONE NUMBER: [phone]  CALLERS FIRST AND LAST NAME: Kristen BridgemanJessica Nicole Salinas  FACILITY NAME: n/a TITLE: n/a  CALLERS RELATIONSHIP:Self  RETURN CALL: Detailed message on voicemail only     [facility] ONLY) Texting is an option for this clinic. If you would like us to use this option, which mobile phone number should we text to if we are unable to reach you?    SUBJECT: General Message   REASON FOR REQUEST: Appt    MESSAGE: Pt requested sooner appt [facility] tried to schedule 8/8 pt refused. Please advise.

## 2015-02-13 ENCOUNTER — Telehealth (HOSPITAL_BASED_OUTPATIENT_CLINIC_OR_DEPARTMENT_OTHER): Payer: Self-pay | Admitting: Obstetrics/Gynecology

## 2015-02-13 NOTE — Telephone Encounter (Signed)
RN returned pt's phone call to clarify her concern. Pt states that she stopped taking her progesterone course on 02/06/15. She experienced one hour of "heavy, dark VB on 02/07/15 and another episode similar to this on 02/09/15. Pt does not know if this counts as withdrawal bleeding and wants to see MD. Appointment scheduled on 02/16/15.

## 2015-02-13 NOTE — Telephone Encounter (Signed)
CONFIRMED PHONE NUMBER: [phone]  CALLERS FIRST AND LAST NAME: Janae BridgemanJessica Nicole Whitwell  FACILITY NAME: na TITLE: na  CALLERS RELATIONSHIP:Self  RETURN CALL: Detailed message on voicemail only     SUBJECT: Medication Management   REASON FOR REQUEST: Medication Management    MEDICATION(S): Provera  MEDICATION(S)  NEEDED BY: na  ADDITIONAL INFORMATION: Patient has been taking Provera, wanted to speak with someone as she has not had a withdrawal bleed.

## 2015-02-16 ENCOUNTER — Ambulatory Visit (HOSPITAL_BASED_OUTPATIENT_CLINIC_OR_DEPARTMENT_OTHER)
Payer: No Typology Code available for payment source | Attending: Obstetrics/Gynecology | Admitting: Obstetrics/Gynecology

## 2015-02-16 VITALS — BP 129/79 | HR 90 | Temp 98.8°F | Ht 63.0 in | Wt 231.0 lb

## 2015-02-16 DIAGNOSIS — B3731 Acute candidiasis of vulva and vagina: Secondary | ICD-10-CM

## 2015-02-16 DIAGNOSIS — R102 Pelvic and perineal pain: Secondary | ICD-10-CM | POA: Insufficient documentation

## 2015-02-16 DIAGNOSIS — N94810 Vulvar vestibulitis: Secondary | ICD-10-CM | POA: Insufficient documentation

## 2015-02-16 DIAGNOSIS — B373 Candidiasis of vulva and vagina: Secondary | ICD-10-CM | POA: Insufficient documentation

## 2015-02-16 LAB — PR U/A NONAUTO DIPSTICK ONLY, ONSITE
Urobilinogen, URN: 0.2 E.U./dL (ref 0.2–1.0)
pH, URN (UWNC): 5 (ref 5.0–8.0)

## 2015-02-16 LAB — PR WET MOUNTS INCL PREP VAGINAL CERV/SKIN SPECIMENS, ONSITE

## 2015-02-16 LAB — PR ALL POTASSIUM HYDROXIDE PREPARATIONS

## 2015-02-16 MED ORDER — FLUCONAZOLE 150 MG OR TABS
ORAL_TABLET | ORAL | Status: DC
Start: 2015-02-16 — End: 2015-08-04

## 2015-02-16 MED ORDER — LEVONORGESTREL-ETHINYL ESTRAD 0.1-20 MG-MCG OR TABS
1.0000 | ORAL_TABLET | Freq: Every day | ORAL | Status: DC
Start: 2015-02-16 — End: 2015-06-29

## 2015-02-16 MED ORDER — TOLNAFTATE 1 % EX POWD
CUTANEOUS | Status: DC
Start: 2015-02-16 — End: 2015-08-04

## 2015-02-16 NOTE — Patient Instructions (Signed)
Keep on the metrogel twice a week  Start the fluconazole twice a week x 2 weeks, then go to once a week  Take one oral contraceptive daily, same time each day.  You may have some irregular bleeding x 2 months.  keep using the clobetasol to the vestibule, okay to use up by the urethra.    Will call ifyou have a bladder infection

## 2015-02-16 NOTE — Progress Notes (Signed)
BP 129/79 mmHg  Pulse 90  Temp(Src) 98.8 F (37.1 C) (Temporal)  Ht 5\' 3"  (1.6 m)  Wt 231 lb (104.781 kg)  BMI 40.93 kg/m2  LMP 02/08/2015  This note was dictated by Neale BurlyEckert, Shelah Heatley F, MD.

## 2015-02-17 NOTE — Telephone Encounter (Signed)
Patient requesting call back regarding message below, CCR made an appointment with patient on 03/09/15 at 9AM to have at least have a visit and also added her to waitlist, but patient is still wanting to speak to care team. Please assist.

## 2015-02-18 ENCOUNTER — Encounter (INDEPENDENT_AMBULATORY_CARE_PROVIDER_SITE_OTHER): Payer: Self-pay | Admitting: Physician Assistant

## 2015-02-18 ENCOUNTER — Telehealth (INDEPENDENT_AMBULATORY_CARE_PROVIDER_SITE_OTHER): Payer: Self-pay | Admitting: Physician Assistant

## 2015-02-18 ENCOUNTER — Ambulatory Visit (INDEPENDENT_AMBULATORY_CARE_PROVIDER_SITE_OTHER): Payer: No Typology Code available for payment source | Admitting: Physician Assistant

## 2015-02-18 VITALS — BP 126/81 | HR 95 | Temp 98.1°F | Ht 63.0 in | Wt 230.6 lb

## 2015-02-18 DIAGNOSIS — E663 Overweight: Secondary | ICD-10-CM

## 2015-02-18 NOTE — Telephone Encounter (Signed)
IN my pod in the "completed work" slot there is a form that need to [facility] to [facility].

## 2015-02-18 NOTE — Progress Notes (Signed)
Reason for visit: weight issues   Reviewed eCare status with Patient:  NO    HEALTH MAINTENANCE:  Has the patient had any of these since their last visit?    Cervical screening/PAP: N/A     Mammo: N/A    Colon Screen: N/A    Have you seen a specialist since your last visit: No        HM Due: no hm due. cc  Health Maintenance   Topic Date Due   . Influenza Vaccine (1) 04/02/2015   . Pap Smear  08/30/2015   . Chlamydia Screen  09/18/2015   . Gonorrhea Screen  09/18/2015   . Tetanus Vaccine  12/26/2022   . HIV Screen  Completed   . HPV Vaccine  Completed           Future Appointments  Date Time Provider Department Center   02/18/2015 10:30 AM Cletis AthensVierra, Michelle Lynne, [place] [facility]   03/02/2015 8:00 AM Lajuana Carryice, Kathryn N, PT [facility]   03/09/2015 8:00 AM Lajuana Carryice, Kathryn N, PT [facility]   03/09/2015 9:00 AM Carles ColletPedersen, Amanda Lee, PA-C [facility]   03/16/2015 8:00 AM Lajuana Carryice, Kathryn N, PT [facility]   04/13/2015 1:00 PM Neale BurlyEckert, Linda F, MD [facility]

## 2015-02-18 NOTE — Progress Notes (Signed)
Kristen Salinas is a 24 year old female here to discuss the following:    (E66.3) Over weight  (primary encounter diagnosis)    Patient presents to discuss weight management as well as starting metformin.    She was advised by a GYN doctor at [facility], Dr. 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  She began to take birth control pills yesterday and she feels a bit nauseous.    She doesn't trust oral contraception for birth control and would like to pursue  Nexplanon in the future at some point.    She is concerned about weight loss and wonders if there is a  program that we would have available to her.          OBJECTIVE:  BP 126/81 mmHg  Pulse 95  Temp(Src) 98.1 F (36.7 C) (Temporal)  Ht 5\' 3"  (1.6 m)  Wt 230 lb 9.6 oz (104.599 kg)  BMI 40.86 kg/m2  SpO2 96%  LMP 02/08/2015  CONSTITUTIONAL/GENERAL:  Appearance:  Well developed, appearing stated age and in no acute distress,  Facial features:     External ears and nose are normal in appearance without significant scars or asymmetry., Weight appears heavy for height, Gait and station:  Normal..  Judgement/insight:  Normal, Mood/affect:  Normal.        ASSESSMENT AND PLAN:    (E66.3) Over weight  (primary encounter diagnosis)  [facility] as well as local weight watchers.    Plan: Paperwork completed for her to get [employer] benefits to pay for [employer].  Those forms will be completed and faxed by end of day tomorrow.    I have included a letter indicating that she is fit to exercise.    As she has had such difficulty with GI distress in the past I would prefer to have her get used to the birth control pills before we add metformin which can, for some people, upset the GI tract.  We discussed having her complete one cycle of birth control pills before we started the metformin and she is agreeable with this plan.  She agrees to let me know when she has completed a cycle of birth control pills and I  will be happy to start her on metformin extended release 500 mg once daily.      Cletis AthensMichelle Lynne Jakyla Reza, [place]

## 2015-02-18 NOTE — Telephone Encounter (Signed)
Situation: Patient wanted to have a sooner appointment   Background: CCR made her an appointment for 03/09/15 @ 9AM   Assessment: Called patient back and informed her that this was the soonest appointment with Lajuan LinesAmanda Pedersen PA  Request: to received a call if someone cancels there appointment.

## 2015-02-18 NOTE — Progress Notes (Signed)
Salinas, Kristen          [id number]          02/16/2015      CLINIC NOTE       DATE OF SERVICE     02/16/2015    IDENTIFICATION/CHIEF COMPLAINT     This is a 24 year old female who presents to me for followup to discuss her irregular bleeding.    HISTORY OF PRESENT ILLNESS     The patient received Depo-Provera in January 2016 and also had a Nexplanon  placed and then removed in a short period of time as well as a Mirena placed and removed in a short period of time.  When I last saw her on June 29, she had been having daily bleeding for almost 2 months, and we were temporizing, waiting for her cycles to resume.  However, the bleeding was becoming more uncomfortable, with increased dysmenorrhea, so we gave her Provera for 10 days.  She took that for 10 days and continued to bleed while on that.  However, once she stopped the Provera, she had one day when she had a large amount of dark blood and then 3 days later some more, and has had no bleeding since.  She has had no bleeding now for 8 days.  She wonders what was her "withdrawal bleed" and is confused.  She is currently not having any menstrual cramping, she does have a low-grade amount of pelvic pain and is under treatment for vestibulitis as well as vaginismus and some pelvic floor neuralgia.  She also has multiple other medical problems ongoing right now such as carpal tunnel syndrome and obesity.    The patient and I talked a long time at her last visit about oral contraceptives.  I reviewed this with her today and she has decided she wants to start oral contraceptives.  The patient's boyfriend, who is with her on 100% of visits 100% of the time, is not eager for her to start oral contraceptives.  However, the patient feels like this is her decision and she wants to do it to try to regulate her periods.  She has no known contraindications such as migraines with aura or a history of blood clots or hypertension.  She has never been on oral contraceptives,  but she was a longtime Depo-Provera user.  We discussed that she could have irregular bleeding for the first 2 to 3 months, but that she would need to take the same pill at about the same time each day and to stick with it, to decide if it was going to work for her to help her decrease her flow amount, decrease the days of flow and decrease her cramping.    The patient's other issue today is she is continuing to have some vulvar burning and when she uses the clobetasol, she gets tenderness in the vestibule region for about 5 minutes afterward.  She is also having some itching and burning in the skin area between her thigh and her vulva, especially on the left side.    The patient is not working currently.  She is on disability because of carpal tunnel.  She is seeing multiple different providers for multiple different issues but her primary care provider is Dr. Lynnell Dike in [place].    PHYSICAL EXAMINATION     VITAL SIGNS:  Today the patient's blood pressure is 129/79.  Her BMI is up to 41.  It has been hovering around the same.  PELVIC:  On directed pelvic exam, the patient has some erythema with point lesions and satellite red marks in her inner crural area of her left thigh consistent with yeast.  On external genitalia, she has some mild erythema but no satellite lesions.  On inspecting her labia minora and her clitoral hood, she has no increased induration there.  Her vestibule is erythematous and is tender to Q-tip palpation.  She also has some tenderness in the periurethral area.  I did not do a speculum exam.  I collected a sample for wet mount and for culture, and also cultured her inner thigh.    Wet mount:  The patient does have hyphae present under KOH.  She has no evidence of bacterial vaginosis at this time, with normal epithelial cells and very few white blood cells present.    ASSESSMENT     1. Chronic BV.  The patient has been on MetroGel twice weekly, this is working well, no evidence of BV today;  however, MetroGel does increase the risk of yeast infection, which I had discussed with the patient previously.  2. Evidence of yeast today, both on her skin in her thigh area and vaginally.  3. Ongoing vestibulitis with vaginismus.  4. Obesity and irregular menses suspicious for PCOS.  5. Irregular bleeding with her actual bleeding improved at this moment.    PLAN     1. Continue with the MetroGel twice weekly.  2. Initiated fluconazole 1 pill 3 days apart times 4 pills for treatment, and then once a week for prevention.  Continue the prevention with fluconazole as long as she is on the MetroGel.  Per pharmacist, 200 mg fluconazole is covered by [employer] in this chronic regimen (vs 150 mg not covered) so will use 200 mg dose  3. Start oral contraceptives low-dose 1/20 pills, instructed thoroughly about how to take them and what to expect.  4. Continue with the clobetasol to the vestibule and may use around the periurethral region as well.  5. Sent a urine culture as patient has some dysuria with a history of frequent UTIs in the past.  6. I sent a note to her primary care provider about the idea of metformin.  This may be of benefit to her, given her challenges with weight and her history of irregular bleeding in the past.  I told the patient that oral contraceptives are a way to help with potential PCOS (though I am not sure if the patient has ever been "officially" diagnosed with PCOS), and that metformin also may be an adjunct both for PCOS but also for weight loss.  I encouraged the patient to talk with her primary care doctor about potential weight loss strategies and weight loss support groups if that were possible.  7. The patient will return to see me in 2 months.  8. The patient is 100% of the time always accompanied by her significant other.  I will try to talk to her individually next time.       A total of 45 minutes of face-to-face time was spent with this patient and her significant other.

## 2015-02-18 NOTE — Patient Instructions (Signed)
It was a pleasure to see you in clinic today.                  If you are not yet signed up for eCare, please speak with a team member at the front desk who would happy to assist you with this process.      eCare  enrollment will allow you access to the below benefits   You can make appointments online   View test results / Lab Results   Obtain a copy of our After Visit Summary (a summary of your visit today)     Your Test Results:  If labs were ordered today the results are expected to be available via eCare in about 5 days. If you have an active eCare account, this is how we will notify you of your results.     If you do not have an eCare account then your test results will be mailed to you within about 14 days after your tests are completed. If your physician needs to change your care based on your results or is concerned, you will receive a phone call.     If you have any questions about your test results please schedule an appointment with your provider.    **If it has been more than 2 weeks and you have not received your test results please send our office a message via eCare.    Medication Refills: If you need a prescription refilled, please contact your pharmacy 1 week before your current supply will run out to request the refill.  Contacting your pharmacy is the fastest and safest way to obtain a medication refill.  The pharmacy will notify our office.  Please note, that a minimum of 48 to 72 hours is needed to refill a medication, but refills are usually processed and sent to your pharmacy in about 5 business days.  Please call your pharmacy early to allow enough time to refill before you anticipate running out.  For faster medication refills, you can also schedule an appointment with your provider.    We know you have a choice in where you receive your healthcare and we sincerely thank you for trusting [facility] with your health.

## 2015-02-19 ENCOUNTER — Telehealth (HOSPITAL_BASED_OUTPATIENT_CLINIC_OR_DEPARTMENT_OTHER): Payer: Self-pay | Admitting: Obstetrics/Gynecology

## 2015-02-19 ENCOUNTER — Other Ambulatory Visit (HOSPITAL_BASED_OUTPATIENT_CLINIC_OR_DEPARTMENT_OTHER): Payer: Self-pay | Admitting: Obstetrics/Gynecology

## 2015-02-19 DIAGNOSIS — N3000 Acute cystitis without hematuria: Secondary | ICD-10-CM

## 2015-02-19 LAB — R/O YEAST CULT W/DIRECT EXAM: Stain For Fungus: NONE SEEN

## 2015-02-19 LAB — URINE C/S: Culture: 5000

## 2015-02-19 MED ORDER — SULFAMETHOXAZOLE-TRIMETHOPRIM 800-160 MG OR TABS
1.0000 | ORAL_TABLET | Freq: Two times a day (BID) | ORAL | Status: AC
Start: 2015-02-19 — End: 2015-02-22

## 2015-02-19 NOTE — Progress Notes (Signed)
Opened in error.  le

## 2015-02-19 NOTE — Telephone Encounter (Signed)
E coli from urine.  5000 col/cc.

## 2015-02-19 NOTE — Telephone Encounter (Signed)
Faxed to [employer] a Health Education Referral Form faxed to [phone].    Closing Te.

## 2015-03-01 ENCOUNTER — Other Ambulatory Visit (INDEPENDENT_AMBULATORY_CARE_PROVIDER_SITE_OTHER): Payer: Self-pay | Admitting: Physician Assistant

## 2015-03-01 ENCOUNTER — Encounter (INDEPENDENT_AMBULATORY_CARE_PROVIDER_SITE_OTHER): Payer: Self-pay | Admitting: Physician Assistant

## 2015-03-01 DIAGNOSIS — F5101 Primary insomnia: Secondary | ICD-10-CM

## 2015-03-02 ENCOUNTER — Encounter (INDEPENDENT_AMBULATORY_CARE_PROVIDER_SITE_OTHER): Payer: Self-pay | Admitting: Physician Assistant

## 2015-03-02 ENCOUNTER — Ambulatory Visit: Payer: No Typology Code available for payment source | Attending: Obstetrics/Gynecology

## 2015-03-02 ENCOUNTER — Encounter (INDEPENDENT_AMBULATORY_CARE_PROVIDER_SITE_OTHER): Payer: No Typology Code available for payment source | Admitting: Obstetrics/Gynecology

## 2015-03-02 ENCOUNTER — Encounter (INDEPENDENT_AMBULATORY_CARE_PROVIDER_SITE_OTHER): Payer: No Typology Code available for payment source | Admitting: Physician Assistant

## 2015-03-02 ENCOUNTER — Ambulatory Visit: Payer: No Typology Code available for payment source | Admitting: Obstetrics/Gynecology

## 2015-03-02 ENCOUNTER — Ambulatory Visit (INDEPENDENT_AMBULATORY_CARE_PROVIDER_SITE_OTHER): Payer: No Typology Code available for payment source | Admitting: Physician Assistant

## 2015-03-02 ENCOUNTER — Encounter (INDEPENDENT_AMBULATORY_CARE_PROVIDER_SITE_OTHER): Payer: Self-pay | Admitting: Obstetrics/Gynecology

## 2015-03-02 VITALS — BP 129/79 | HR 79 | Temp 98.8°F | Ht 63.0 in | Wt 231.4 lb

## 2015-03-02 VITALS — BP 129/79 | HR 83 | Temp 98.1°F | Ht 63.0 in | Wt 231.5 lb

## 2015-03-02 DIAGNOSIS — M25552 Pain in left hip: Secondary | ICD-10-CM | POA: Insufficient documentation

## 2015-03-02 DIAGNOSIS — R293 Abnormal posture: Secondary | ICD-10-CM

## 2015-03-02 DIAGNOSIS — M25551 Pain in right hip: Secondary | ICD-10-CM | POA: Insufficient documentation

## 2015-03-02 DIAGNOSIS — M6281 Muscle weakness (generalized): Secondary | ICD-10-CM | POA: Insufficient documentation

## 2015-03-02 DIAGNOSIS — M62838 Other muscle spasm: Secondary | ICD-10-CM

## 2015-03-02 DIAGNOSIS — R300 Dysuria: Secondary | ICD-10-CM

## 2015-03-02 DIAGNOSIS — N94810 Vulvar vestibulitis: Secondary | ICD-10-CM | POA: Insufficient documentation

## 2015-03-02 DIAGNOSIS — E663 Overweight: Secondary | ICD-10-CM

## 2015-03-02 DIAGNOSIS — R102 Pelvic and perineal pain: Secondary | ICD-10-CM

## 2015-03-02 DIAGNOSIS — K5900 Constipation, unspecified: Secondary | ICD-10-CM | POA: Insufficient documentation

## 2015-03-02 DIAGNOSIS — R3989 Other symptoms and signs involving the genitourinary system: Secondary | ICD-10-CM

## 2015-03-02 DIAGNOSIS — R3911 Hesitancy of micturition: Secondary | ICD-10-CM | POA: Insufficient documentation

## 2015-03-02 DIAGNOSIS — R110 Nausea: Secondary | ICD-10-CM

## 2015-03-02 MED ORDER — NORTRIPTYLINE HCL 10 MG OR CAPS
10.0000 mg | ORAL_CAPSULE | Freq: Every evening | ORAL | Status: DC
Start: 2015-03-02 — End: 2015-05-11

## 2015-03-02 MED ORDER — TRAZODONE HCL 100 MG OR TABS
100.0000 mg | ORAL_TABLET | Freq: Every evening | ORAL | Status: DC
Start: 2015-03-02 — End: 2015-08-04
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
## 2015-03-02 NOTE — Telephone Encounter (Signed)
Medication was filled today.     Closing TE.

## 2015-03-02 NOTE — Telephone Encounter (Signed)
Please see new eCare message

## 2015-03-02 NOTE — Telephone Encounter (Signed)
Kristen Salinas patient is requesting medication of Metformin, and has questions about the birthcontrol, and a refill of Trazodone please advise patient     Thank you

## 2015-03-02 NOTE — Progress Notes (Signed)
Physical Therapy Progress Note   Time in: 8am          Time out: 8:45am        Duration: 45 minutes  The following identifiers were confirmed with the patient today:name and date of birth  Referring Provider: Jerelyn Charles  Referring provider NPI: [id number]    Today's date: 03/02/2015  Start of Care: 12/03/2014    Number of Visits Since start of care: 8  Reporting Period from 03/02/2015 to 04/01/2015    Subjective/Pain: Vaginal spasms are improved. Still feels as though pelvic floor is quite tight; having urinary frequency which she thinks is contributing to chronic urinary tract infections. Had intercourse which was no particularly painful with insertion or during, but she had significant pain the next day. Started birth control; this is upsetting her stomach quite a bit.     The patient is reporting 5/10 level of pain/percieved severity of symptoms, on a 0-10 Numeric Pain Distress Scale.     Skilled services/interventions provided:    Manual therapy for 25 minutes: Soft tissue mobilization to bilateral hip adductors, externally to urogenital triangle and levator ani, intra-vaginally to ischiocavernosus, bulbocavernosus, compressor urethrae, pubococcygeus, iliococcygeus, obturator internus: moderate pressure, sustained holds and strumming, tender point release.    Neuromuscular reeducation for 15 minutes: sEMG peri-anal electrodes placed for pelvic floor downtraining in supine and standing. Instruction in pinwheel breath and crede maneuver for voiding to improve emptying.    Therapeutic exercise for 5 minutes: 1. Double knee to chest stretch 2. Supine butterfly stretch. Both stretch 30-60 second holds, 2-3 times per day.     Patient's response to therapy: Improved vaginal spasms at rest/with ADL/IADL. Continued pelvic floor tension with internal palpation and after vaginal insertion with intercourse.    Patient education/instruction: verbal instructions and demonstrations were provided for the above exercises          Change in impairments:   Observation: Sway back posture, B knee hyperextension, B heel weight bearing, significant B valgus deformity knees  Impairments:  Range of motion: Lumbar motion full; B hips limited in IR   Muscle Strength: B LEs grossly 4-/5; fair lumbopelvic stabilization with mobility and movement  Joint mobility: hypomobile through lumbar spine  Special tests: right ilium up slip, Quadratus lumborum 4/5 soft tissue restriction  Pelvic Floor Muscle Testing:  Observation: redness around vestibule bilaterally  Posture: posterior pelvic tilt in sitting   Range of motion/Quality of motion/Recruitment Pattern: Perineal resting level elevated by 25% at baseline. 25% perineal elevation with contraction. nil perineal descent with bulge. Coordination fair, pelvic pain after pelvic floor contraction/lift.   Muscle Strength: [place]cock: not tested due to pelvic pain (MMT/Hold in seconds/repetitions at hold/quick)  Sensation: minimal pain with light touch to vestibule  Reflexes: Cough: absent, Anal wink: not tested   Palpation: ischiocavernosus, bulbocavernosus, superficial transverse, iliococcygeus, pubococcygeus perineum 4/5 soft tissue restriction with external palpation. Bilateral hip adductors soft tissue restriction 1/5. Ischiocavernosus, bulbocavernosus, superficial transverse perineum, iliococcygeus, pubococcygeus, obturator internus: 4/5 soft tissue restriction bilaterally with internal palpation.    Myofascial/abdominal wall assessment: 3/5 soft tissue restriction to abdominal wall and suprapubic pelvis, 5/5 tissue restriction with skin rolling to suprapubic region  Prolapse/Tissue Laxity: not tested   External Anal Sphincter Contraction: not tested     Functional improvement/progress toward each goals:   1. Complete PFM exam: goal met  2. B hip strength 5/5: 50% met, limited home program compliance with hip strengthening  3. Normal MF mobility abd wall and suprapubic  pelvis: 50% met  4. Level pelvis: goal  mett  Long term functional goals/Outcome:   1. Patient able to squat, walk, transfer sit to stand with pelvic pain no greater than 3/10. Goal met  2. Patient able to have a BM without pain. By 03/15/2015. Goal met  3. No urinary hesitation by 03/15/2015. 50% met  4. Absent vaginal spasms by 03/15/2015. Goal met  5. Pain with/after vaginal insertion with intercourse. Goal not met    Functional limitation(s) remaining requiring continued skilled services:  vulvar pain, pain after vaginal insertion with intercourse.    Any changes in plan of treatment: Resume and progress manual therapy for soft tissue restriction of hips and pelvic floor. Further assess lumbopelvic girdle, hip mobility. Progress hip strengthening.    Salli Quarry, PT, DPT

## 2015-03-02 NOTE — Progress Notes (Signed)
Kristen Salinas is a 24 year old female here to discuss the following:    nausea since starting OCPs.    Callan started her OCPs about 2 weeks ago. She reports daily nausea.  She has not had vomiting.    she has tried taking it with food, but it hasn't helped.  Nausea is not improving.    She is also concerned about her weight and the efficacy of oral contraceptives as well as implants related to her weight.  Essentially, she is concerned that if she is too heavy her birth control may not be as effective.  She has heard from [employer], but has not yet started the program.  She wonders about medications to assist with weight loss.    Manuel shared that she will be having injections for vestibulitis in the near future.          OBJECTIVE:  BP 129/79 mmHg  Pulse 79  Temp(Src) 98.8 F (37.1 C) (Temporal)  Ht  (1.6 m)  Wt 231 lb 6.4 oz (104.962 kg)  BMI 41.00 kg/m2  SpO2 99%  LMP 02/08/2015  CONSTITUTIONAL/GENERAL:  Appearance:  Well developed, appearing stated age and in no acute distress,  Facial features:     External ears and nose are normal in appearance without significant scars or asymmetry., Weight appears heavy for height, Gait and station:  Normal..  Judgement/insight:  Normal, Mood/affect:  Normal.        ASSESSMENT AND PLAN:      (R11.0) Nausea  (primary encounter diagnosis)  Plan: We discussed that it is not uncommon to encounter nausea with the first pack of birth control pills.  Usually it improves over time.  It is usually recommended to try to complete one pack of birth control pills to see if the nausea improves.  If not, we could certainly try a different birth control pill such as Sprintec.  As she was prescribed birth control pills by Dr. Dolphus Jenny, I suggested that she contact or discuss this with her.    (E66.3) Excess weight  Plan: We discussed that excess weight can affect various birth control methods.    Using two forms of birth control would likely ease her mind and  provide increased protection against unintended pregnancy.    We further discussed that there are medications that can assist with weight loss however, weight gain usually occurs after the medication is stopped.  In other words,  sometimes it helps with weight loss, but it is rarely sustained after the medications are withdrawn.  We discussed the long-standing of endorsement of [employer] by various organizations as it provides long-term healthy weight loss strategies.  We further discussed that her counselors at [employer] will assist her with cost effective ways to do the program.    I spent a total time of 20 minutes face-to-face with the patient, of which more than 50% was spent counseling as outlined in this note.  Cletis [place], [place]

## 2015-03-02 NOTE — Patient Instructions (Signed)
Take nortriptyline  nightly to decrease nighttime voiding.  Increase by  per night every few nights to a maximum dose of 50 mg per night to control your symptoms.  I will let your primary care doctor know that I prescribed this since you also take Cymbalta.    Trigger point injections with marcaine and kenalog in the OR - Call Katheren Puller next week if you have not heard from her: [phone].    Consider lactobacillus vaginally - will discuss with Dr. Dolphus Jenny.    Urine culture whenever UTI symptoms and only treat if positive.    Continue PT with Tamala Bari.    Void when your bladder is full.

## 2015-03-02 NOTE — Patient Instructions (Signed)
It was a pleasure to see you in clinic today.                  If you are not yet signed up for eCare, please speak with a team member at the front desk who would happy to assist you with this process.      eCare  enrollment will allow you access to the below benefits   You can make appointments online   View test results / Lab Results   Obtain a copy of our After Visit Summary (a summary of your visit today)     Your Test Results:  If labs were ordered today the results are expected to be available via eCare in about 5 days. If you have an active eCare account, this is how we will notify you of your results.     If you do not have an eCare account then your test results will be mailed to you within about 14 days after your tests are completed. If your physician needs to change your care based on your results or is concerned, you will receive a phone call.     If you have any questions about your test results please schedule an appointment with your provider.    **If it has been more than 2 weeks and you have not received your test results please send our office a message via eCare.    Medication Refills: If you need a prescription refilled, please contact your pharmacy 1 week before your current supply will run out to request the refill.  Contacting your pharmacy is the fastest and safest way to obtain a medication refill.  The pharmacy will notify our office.  Please note, that a minimum of 48 to 72 hours is needed to refill a medication, but refills are usually processed and sent to your pharmacy in about 5 business days.  Please call your pharmacy early to allow enough time to refill before you anticipate running out.  For faster medication refills, you can also schedule an appointment with your provider.    We know you have a choice in where you receive your healthcare and we sincerely thank you for trusting [facility] with your health.

## 2015-03-02 NOTE — Progress Notes (Signed)
Reason for visit: discuss birthcontrol and metformin  Reviewed eCare status with Patient:  YES    HEALTH MAINTENANCE:  Has the patient had any of these since their last visit?    Cervical screening/PAP: UTD     Mammo: N/A    Colon Screen: N/A    Have you seen a specialist since your last visit: No        HM Due:   Health Maintenance   Topic Date Due   . Pap Smear  08/30/2015   . Influenza Vaccine (1) 04/02/2015   . Chlamydia Screen  09/18/2015   . Gonorrhea Screen  09/18/2015   . Tetanus Vaccine  12/26/2022   . HIV Screen  Completed   . HPV Vaccine  Completed           Future Appointments  Date Time Provider Department Center   03/02/2015 3:30 PM Cletis [place], [place] [facility]   03/09/2015 8:00 AM Lajuana Carry, PT [facility]   03/09/2015 9:00 AM Carles Collet, PA-C [facility]   03/16/2015 8:00 AM Lajuana Carry, PT [facility]   04/13/2015 1:00 PM Neale Burly, MD [facility]

## 2015-03-02 NOTE — Telephone Encounter (Signed)
From: Kristen Salinas  To: Cletis [place], [place]  Sent: 03/01/2015 11:40 AM PDT  Subject: Medication Renewal Request    Original authorizing provider: Cletis [place], [place]    Kristen Salinas would like a refill of the following medications:  TraZODone HCl 100 MG Oral Tab Cletis [place], [place]]    Preferred pharmacy: [facility] #[id number] [phone] [address] [facility] [place] [phone]     Comment:

## 2015-03-04 ENCOUNTER — Encounter (HOSPITAL_BASED_OUTPATIENT_CLINIC_OR_DEPARTMENT_OTHER): Payer: Self-pay | Admitting: Obstetrics/Gynecology

## 2015-03-09 ENCOUNTER — Encounter (INDEPENDENT_AMBULATORY_CARE_PROVIDER_SITE_OTHER): Payer: Self-pay | Admitting: Physician Assistant

## 2015-03-09 ENCOUNTER — Ambulatory Visit (HOSPITAL_BASED_OUTPATIENT_CLINIC_OR_DEPARTMENT_OTHER): Payer: No Typology Code available for payment source

## 2015-03-09 ENCOUNTER — Encounter (HOSPITAL_BASED_OUTPATIENT_CLINIC_OR_DEPARTMENT_OTHER): Payer: No Typology Code available for payment source | Admitting: Obstetrics/Gynecology

## 2015-03-09 ENCOUNTER — Ambulatory Visit: Payer: No Typology Code available for payment source | Attending: Physician Assistant | Admitting: Physician Assistant

## 2015-03-09 VITALS — BP 119/79 | HR 91 | Ht 63.0 in | Wt 231.0 lb

## 2015-03-09 DIAGNOSIS — G5601 Carpal tunnel syndrome, right upper limb: Secondary | ICD-10-CM

## 2015-03-09 DIAGNOSIS — M6281 Muscle weakness (generalized): Secondary | ICD-10-CM

## 2015-03-09 DIAGNOSIS — M25551 Pain in right hip: Secondary | ICD-10-CM

## 2015-03-09 DIAGNOSIS — R102 Pelvic and perineal pain: Secondary | ICD-10-CM

## 2015-03-09 DIAGNOSIS — R3911 Hesitancy of micturition: Secondary | ICD-10-CM

## 2015-03-09 DIAGNOSIS — G5602 Carpal tunnel syndrome, left upper limb: Secondary | ICD-10-CM

## 2015-03-09 DIAGNOSIS — R293 Abnormal posture: Secondary | ICD-10-CM

## 2015-03-09 DIAGNOSIS — M62838 Other muscle spasm: Secondary | ICD-10-CM

## 2015-03-09 DIAGNOSIS — M25552 Pain in left hip: Secondary | ICD-10-CM

## 2015-03-09 DIAGNOSIS — K5900 Constipation, unspecified: Secondary | ICD-10-CM

## 2015-03-09 DIAGNOSIS — N94810 Vulvar vestibulitis: Secondary | ICD-10-CM

## 2015-03-09 NOTE — Progress Notes (Signed)
Physical Therapy Progress Note   Time in: 8am          Time out: 8:45am        Duration: 45 minutes  The following identifiers were confirmed with the patient today:name and date of birth  Referring Provider: [name]  Referring provider NPI: [id number]    Today's date: 03/09/2015  Start of Care: 12/03/2014    Number of Visits Since start of care: 9  Reporting Period from 03/09/2015 to 04/08/2015    Subjective/Pain: Vaginal spasms are improved. Still feels as though pelvic floor is quite tight; having urinary frequency which she thinks is contributing to chronic urinary tract infections. Had intercourse which was no particularly painful with insertion or during, but she had significant pain the next day. Started birth control; this is upsetting her stomach quite a bit.     The patient is reporting 5/10 level of pain/percieved severity of symptoms, on a 0-10 Numeric Pain Distress Scale.     Skilled services/interventions provided:    Manual therapy for 35 minutes: Soft tissue mobilization to bilateral hip adductors, hamstrings, externally to urogenital triangle and levator ani, intra-vaginally to ischiocavernosus, bulbocavernosus, compressor urethrae, pubococcygeus, iliococcygeus, obturator internus: moderate pressure, sustained holds and strumming, tender point release.    Therapeutic exercise for 10 minutes: 1. Monster walks with knees bent, athletic stance, 2 sets of 10 repetitions. 2. Single leg stance with minimal trunk movement for lower extremity strengthening, 10 second hold each side, 2-3 repetitions. Continue with previous home program including:  3. Double knee to chest stretch 4. Supine butterfly stretch. Both stretch 30-60 second holds, 2-3 times per day.     Patient's response to therapy: Improved vaginal spasms at rest/with ADL/IADL. Continued pelvic floor tension with internal palpation and after vaginal insertion with intercourse.    Patient education/instruction: verbal instructions and  demonstrations were provided for the above exercises     Change in impairments:   Observation:   Impairments:  Range of motion: Lumbar motion full; B hips limited in IR by 25%, no pain at end range   Muscle Strength: B LEs grossly 5/5 with muscle testing; fair to good lumbopelvic stabilization with mobility and movement  Joint mobility: hypomobile through lumbar spine  Special tests: Quadratus lumborum 4/5 soft tissue restriction  Single leg stance: fair bilaterally with significant trunk lateral lean to left on left and poor gluteal activation bilaterally, moderate sway  Pelvic Floor Muscle Testing:  Observation: redness around vestibule bilaterally  Posture: posterior pelvic tilt in sitting   Range of motion/Quality of motion/Recruitment Pattern: Perineal resting level elevated by 25% at baseline. 25% perineal elevation with contraction. nil perineal descent with bulge. Coordination fair, pelvic pain after pelvic floor contraction/lift.   Muscle Strength: Laycock: not tested due to pelvic pain (MMT/Hold in seconds/repetitions at hold/quick)  Sensation: minimal pain with light touch to vestibule  Reflexes: Cough: absent, Anal wink: not tested   Palpation: ischiocavernosus, bulbocavernosus, superficial transverse, iliococcygeus, pubococcygeus perineum 4/5 soft tissue restriction with external palpation. Bilateral hip adductors, hamstring soft tissue restriction 2/5. Ischiocavernosus, bulbocavernosus, superficial transverse perineum, iliococcygeus, pubococcygeus, obturator internus: 4/5 soft tissue restriction bilaterally with internal palpation.    Myofascial/abdominal wall assessment: 3/5 soft tissue restriction to abdominal wall and suprapubic pelvis, 5/5 tissue restriction with skin rolling  to suprapubic region  Prolapse/Tissue Laxity: not tested   External Anal Sphincter Contraction: not tested     Functional improvement/progress toward each goals:   1. Complete PFM exam: goal met  2.   B hip strength 5/5: 50%  met, limited home program compliance with hip strengthening  3. Normal MF mobility abd wall and suprapubic pelvis: 50% met  4. Level pelvis: goal met  Long term functional goals/Outcome:   1. Patient able to squat, walk, transfer sit to stand with pelvic pain no greater than 3/10. Goal met  2. Patient able to have a BM without pain. By 03/15/2015. Goal met  3. No urinary hesitation by 03/15/2015. 50% met  4. Absent vaginal spasms by 03/15/2015. Goal met  5. Pain with/after vaginal insertion with intercourse. Goal not met    Functional limitation(s) remaining requiring continued skilled services:  vulvar pain, pain after vaginal insertion with intercourse.    Any changes in plan of treatment: Resume and progress manual therapy for soft tissue restriction of hips and pelvic floor. Progress hip strengthening, stretching.    Kathryn N Rice, PT, DPT

## 2015-03-09 NOTE — Progress Notes (Signed)
Chief Complaint   Patient presents with   . New Patient     Carpal tunnel bilateral     Dear Dr. Lynnell Dike, Joyce Gross,*,    I had the pleasure of seeing your patient, Kristen Salinas at [facility] today on 03/09/2015 in consultation for evaluation and treatment of R>L carpal tunnel syndrome.    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 She presents with a chief complaint of pain, numbness and weakness in the bilateral hand that has been ongoing for "a few years." Patient denies any history of trauma or injury to the hand. Patient admits to previous episodes of numbness in her right hand that used to resolve in the past. 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 She thinks she won't be at this job for much longer. She has tried some bracing but hasn't tried it recently.  She currently rates her pain as 8 out of 10 that is Aching, Throbbing and Stabbing. Pain is aggravated by activity and pressure. Sensation is diminished in the R>L hand. She notes a decrease in strength . She currently rates her strength  as 6 out of 10. The symptoms are constant.    Past Medical History:  Past Medical History   Diagnosis Date   . Obesity    . Pap smear abnormality of cervix    . Heart burn    . Chest pain    . History of irritable bowel syndrome    . Frequent use of laxatives    . GERD (gastroesophageal reflux disease)    . Indigestion    . Nausea and vomiting    . Vulvar vestibulitis 11/26/2014   . Disorder of menstrual bleeding    . Gastric ulcer    . Depression    . Anxiety    . Headache    . Heart murmur        Medications:  Current Outpatient Prescriptions   Medication Sig Dispense Refill   . Clobetasol Propionate 0.05 % External Ointment Use pea sized amount to vestibule nightly x 6 weeks 30 g 2   . DULoxetine HCl 60 MG Oral CAPSULE ENTERIC COATED PARTICLES Take 1 capsule (60 mg) by mouth daily. 30 capsule 2   . Fluconazole 150  MG Oral Tab Take one tablet mon/thurs/mon/thurs, then go to once a week on Mondays 12 tablet 1   . Gabapentin 100 MG Oral Cap Take 1-3 capsules (100-300 mg) by mouth at bedtime. 90 capsule 0   . HydrOXYzine Pamoate 50 MG Oral Cap take one at bedtime for insomnia 30 capsule 2   . Ibuprofen 600 MG Oral Tab For pain/inflammation. Take with food. 100 tablet 2   . Levonorgestrel-Ethinyl Estrad 0.1-20 MG-MCG Oral Tab Take 1 tablet by mouth daily. 3 packet 3   . Magnesium Oxide 400 MG Oral Tab Take 2 tablets (800 mg) by mouth daily. 60 tablet 4   . MetroNIDAZOLE 0.75 % Vaginal Gel Place 1 applicatorful into the vagina 2 times a week. Use 2 x weekly for 4 weeks. 45 g 1   . Nortriptyline HCl 10 MG Oral Cap Take 1 capsule (10 mg) by mouth at bedtime. 150 capsule 4   . Omeprazole 20 MG Oral CAPSULE DELAYED RELEASE Take 1 capsule (20 mg) by mouth daily on an empty stomach. 30 capsule 1   . Tolnaftate 1 % External Powder Use to  space between legs and vulva twice daily 90 g 2   . TraZODone HCl 100 MG Oral Tab Take 1 tablet (100 mg) by mouth at bedtime. for Insomnia. 90 tablet 3     No current facility-administered medications for this visit.       Allergies:   Review of patient's allergies indicates:  Allergies   Allergen Reactions   . Prednisone Rash and Hives       Past Surgical History:  Past Surgical History   Procedure Laterality Date   . Colonoscopy stoma dx including collj spec spx     . Esophagogastroduodenoscopy         Social History:   The patient states her problem is not work related.  She is single and has 0 children.     History   Alcohol Use   . Yes     Comment: Rare     History   Smoking status   . Former Smoker -- 0.25 packs/day for 2 years   . Types: Cigarettes   Smokeless tobacco   . Never Used       ROS:   Positive for General: weight gain  Stomach:  constipation  Neurologic:  numbness/tingling  Mental Health:  anxiety and depression. All other systems of the 14 reviewed are negative.     Hand & Upper  Extremity Examination    Physical Examination  Gen: Patient is healthy, alert, no distress    Psych: Alert and oriented times 3  Pleasant female. Mood and affect appropriate.    Skin: warm, normal color and sweat patterns.  no abrasions, lacerations, or ecchymosis.    Vascular: Fingers warm, pink, with brisk capillary refill    Neurologic: Sensation to light touch is decreased over the R thumb, IF, MF, L thumb  Tinel's over carpal tunnel Positive bilat  Median nerve compression test (Durkan's) Positive bilat  Tinel's over cubital tunnel mild Positive right  Elbow flexion test Negative    Musculoskeletal:  Inspection of the bilateral upper extremity shows no gross deformity or evidence of atrophy.  Normal finger cascade, no scissoring.  No palpable masses  Full composite flexion/extension all digits  5/5 strength WE, WF, FE, FF, grip  5/5 strength APB, intrinsics    Studies: NCS/ EMG was reviewed and shows Median nerve motor latency of 4.9 and no Median nerve sensory latency provided. Left motor latency for median nerve 4.1 ms, sensory latency 4.2. Very few polys noted over R APB on EMG. 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 Since patient continues to have symptoms despite bracing, NSAIDs, and has positive EMG, would recommend surgical release. Discussed endoscopic vs. Open release, with patient opting for open release on the right side. Recommend continued conservative management of left side until after recovery from right. Discussed that if left side worsens, she may consider release. Discussed surgical risks with patient, including, but not limited to, infection, wound problems, nerve or blood vessel injury, swelling, stiffness, loss of motion, persistent pain and numbness, chance for revision surgery, and anesthesia risks. Patient would like to proceed with procedure and understand role in  therapy and compliance with instructions for optimal outcome.  -[facility] packet filled out, consents signed  -Labs: none  -PMHx- problem list states chest pain and heart murmur but patient denies dizziness, LOC, SOB or other CV symptoms. States her mother  had a murmur but is unsure if she actually has one.  -Continue bracing at night and with activities until surgery    Thank you for allowing [place] to participate in the care of your patient.    Sincerely,    Lajuan Lines, MS PA-C  [facility]  Department of Orthopedics and Sports Medicine  Hand, Wrist, and Elbow Surgery

## 2015-03-09 NOTE — Progress Notes (Signed)
Preoperative education done for Right carpal tunnel rellease  per [facility] protocol. Pt given the following literature: "Information About Your Health Care", the [facility] Pre surgery booklet and Post-op instructions. Stressed NPO status prior to surgery. Answered multiple questions. Pt states understanding of process and accepts plan. Call if further questions or issues, both pre- and/or post-operatively.     Reviewed DVT precautions with patient     Patientsk husband Marcial Pacas will be driving patient home after discharge    Ludger Nutting, RN  [facility]

## 2015-03-12 ENCOUNTER — Encounter (HOSPITAL_BASED_OUTPATIENT_CLINIC_OR_DEPARTMENT_OTHER): Payer: Self-pay | Admitting: Obstetrics/Gynecology

## 2015-03-16 ENCOUNTER — Ambulatory Visit (HOSPITAL_BASED_OUTPATIENT_CLINIC_OR_DEPARTMENT_OTHER): Payer: No Typology Code available for payment source

## 2015-03-16 DIAGNOSIS — M62838 Other muscle spasm: Secondary | ICD-10-CM

## 2015-03-16 DIAGNOSIS — N94810 Vulvar vestibulitis: Secondary | ICD-10-CM

## 2015-03-16 DIAGNOSIS — R3911 Hesitancy of micturition: Secondary | ICD-10-CM

## 2015-03-16 DIAGNOSIS — R293 Abnormal posture: Secondary | ICD-10-CM

## 2015-03-16 DIAGNOSIS — M6281 Muscle weakness (generalized): Secondary | ICD-10-CM

## 2015-03-16 DIAGNOSIS — R102 Pelvic and perineal pain: Secondary | ICD-10-CM

## 2015-03-16 NOTE — Progress Notes (Signed)
Physical Therapy Progress Note   Time in: 8am          Time out: 8:45am        Duration: 45 minutes  The following identifiers were confirmed with the patient today:name and date of birth  Referring Provider: Jerelyn Charles  Referring provider NPI: [id number]    Today's date: 03/16/2015  Start of Care: 12/03/2014    Number of Visits Since start of care: 10  Reporting Period from 03/16/2015 to 04/15/2015    Subjective/Pain: Vaginal spasms are still improved. Had menstrual cramps the last couple of days which she wasn't expecting with her new birth control; reports it felt as though she had a urinary tract infection. Back has been stiff and painful for the last several weeks.    The patient is reporting 5/10 level of pain/percieved severity of symptoms, on a 0-10 Numeric Pain Distress Scale.     Skilled services/interventions provided:    Manual therapy for 35 minutes: Soft tissue mobilization to bilateral hip adductors, hamstrings, lower abdominals. Externally to urogenital triangle and levator ani, internal mobilizations deferred today due to patient request while on menstrual cycle. moderate pressure, sustained holds and strumming, tender point release.    Therapeutic exercise for 10 minutes: 1. Lumbar rotations in hooklying 20-30 repetitions for home program 2 times per day. 2. Prone extension on elbows, sustained. Home program components.    Patient's response to therapy: Improved back range of motion with exercises and manual therapy today.    Patient education/instruction: verbal instructions and demonstrations were provided for the above exercises     Change in impairments:   Impairments:  Range of motion: Lumbar motion full; B hips limited in IR by 25%, no pain at end range   Muscle Strength: B LEs grossly 5/5 with muscle testing; fair to good lumbopelvic stabilization with mobility and movement  Joint mobility: hypomobile through lumbar spine  Special tests: Quadratus lumborum 4/5 soft tissue  restriction  Single leg stance: fair bilaterally with significant trunk lateral lean to left on left and poor gluteal activation bilaterally, moderate sway  Pelvic Floor Muscle Testing:  Observation: redness around vestibule bilaterally  Posture: posterior pelvic tilt in sitting   Range of motion/Quality of motion/Recruitment Pattern: Perineal resting level elevated by 25% at baseline. 25% perineal elevation with contraction. nil perineal descent with bulge. Coordination fair, pelvic pain after pelvic floor contraction/lift.   Muscle Strength: Laycock: not tested due to pelvic pain (MMT/Hold in seconds/repetitions at hold/quick)  Sensation: minimal pain with light touch to vestibule  Reflexes: Cough: absent, Anal wink: not tested   Palpation: ischiocavernosus, bulbocavernosus, superficial transverse, iliococcygeus, pubococcygeus perineum 4/5 soft tissue restriction with external palpation. Bilateral hip adductors, hamstring soft tissue restriction 2/5. Ischiocavernosus, bulbocavernosus, superficial transverse perineum, iliococcygeus, pubococcygeus, obturator internus: 4/5 soft tissue restriction bilaterally with internal palpation.    Myofascial/abdominal wall assessment: 3/5 soft tissue restriction to abdominal wall and suprapubic pelvis, 5/5 tissue restriction with skin rolling  to suprapubic region  Prolapse/Tissue Laxity: not tested   External Anal Sphincter Contraction: not tested     Functional improvement/progress toward each goals:   1. Complete PFM exam: goal met  2. B hip strength 5/5: 50% met, limited home program compliance with hip strengthening  3. Normal MF mobility abd wall and suprapubic pelvis: 50% met  4. Level pelvis: goal met  Long term functional goals/Outcome:   1. Patient able to squat, walk, transfer sit to stand with pelvic pain no greater than 3/10. Goal  met  2. Patient able to have a BM without pain. By 03/15/2015. Goal met  3. No urinary hesitation by 03/15/2015. 50% met  4. Absent vaginal  spasms by 03/15/2015. Goal met  5. Pain with/after vaginal insertion with intercourse. Goal not met    Functional limitation(s) remaining requiring continued skilled services:  vulvar pain, pain after vaginal insertion with intercourse.    Any changes in plan of treatment: Resume and progress manual therapy for soft tissue restriction of hips and pelvic floor. Progress hip strengthening, stretching.    Salli Quarry, PT, DPT

## 2015-03-23 ENCOUNTER — Telehealth (HOSPITAL_BASED_OUTPATIENT_CLINIC_OR_DEPARTMENT_OTHER): Payer: Self-pay | Admitting: Obstetrics/Gynecology

## 2015-03-23 NOTE — Telephone Encounter (Signed)
(  TEXTING IS AN OPTION FOR [facility] CLINICS ONLY)  Is this a [facility] clinic? No      RETURN CALL: Detailed message on voicemail only      SUBJECT:  Appointment Request     REASON FOR REQUEST/SYMPTOMS: Painful and burning urination  REFERRING PROVIDER: NA  REQUEST APPOINTMENT WITH: Marye Round  REQUESTED DATE: Soonest available this week, TIME: NA  UNABLE TO APPOINTMENT BECAUSE: No appointments available    Please contact the patient to triage.  Thank you

## 2015-03-23 NOTE — Telephone Encounter (Signed)
Patient states she has a UTI. Is having frequency and dysuria. I offered to schedule with a different provider as she is requesting to only see Dr Dolphus Jenny. I let her know Dr Dolphus Jenny is out this week on vacation. She stated " I will just wait until my September 12 th appointment". I strongly encouraged her to not wait that long as a UTI can cause kidney problems if untreated. She say's she will call her PCP and see them instead. I let her know if she changed her mind to call and another provider would see her.

## 2015-03-25 ENCOUNTER — Ambulatory Visit (INDEPENDENT_AMBULATORY_CARE_PROVIDER_SITE_OTHER): Payer: No Typology Code available for payment source | Admitting: Physician Assistant

## 2015-03-25 ENCOUNTER — Encounter (INDEPENDENT_AMBULATORY_CARE_PROVIDER_SITE_OTHER): Payer: Self-pay | Admitting: Physician Assistant

## 2015-03-25 VITALS — BP 118/82 | HR 90 | Temp 98.5°F | Ht 63.0 in | Wt 235.4 lb

## 2015-03-25 DIAGNOSIS — R300 Dysuria: Secondary | ICD-10-CM

## 2015-03-25 DIAGNOSIS — F419 Anxiety disorder, unspecified: Secondary | ICD-10-CM

## 2015-03-25 DIAGNOSIS — N760 Acute vaginitis: Secondary | ICD-10-CM

## 2015-03-25 LAB — PR WET PREP/KOH/TEST, ONSITE
Clue Cells: NEGATIVE
Odor: ABSENT
Trich: NEGATIVE
Yeast, Wet Prep: NEGATIVE

## 2015-03-25 LAB — PR U/A AUTO DIPSTICK ONLY, ONSITE
Bilirubin, Urine: NEGATIVE
Glucose, Urine: NEGATIVE mg/dL
Ketones, URN: NEGATIVE mg/dL
Leukocytes: NEGATIVE
Nitrite, URN: NEGATIVE
Occult Blood, URN: NEGATIVE
Protein: NEGATIVE mg/dL
Specific Gravity, Urine: 1.015 (ref 1.005–1.030)
Urobilinogen, URN: 0.2 E.U./dL (ref 0.2–1.0)
pH, URN: 6 (ref 5.0–8.0)

## 2015-03-25 MED ORDER — DULOXETINE HCL 60 MG OR CPEP
60.0000 mg | DELAYED_RELEASE_CAPSULE | Freq: Every day | ORAL | Status: DC
Start: 2015-03-25 — End: 2015-07-28

## 2015-03-25 NOTE — Patient Instructions (Signed)
We're Moving!!  As of January 2017 our new address will be [address] in [place].      It was a pleasure to see you in clinic today.                If you are not yet signed up for eCare, please see the below eCare section at the end of this document for how to enroll and to get your access codes.  It's easy to sign up at home today.    eCare  enrollment will allow you access to the below benefits   You can make appointments online   View test results / Lab Results   Request prescription renewals   Obtain a copy of our After Visit Summary (an electronic copy of this document)     Your Test Results:  If labs were ordered today the results are expected to be available via eCare in about 5 days. If you have an active eCare account, this is how we will notify you of your results.     If you do not have an eCare account then your test results will be mailed to you within about 14 days after your tests are completed. If your physician needs to change your care based on your results or is concerned, you should expect a phone call or eCare message from your provider.    If you have any questions about your test results please schedule an appointment with your provider, so that we may review them with you in greater detail.    **If it has been more than 2 weeks and you have not received your test results please send our office a message via eCare.    Medication Refills: If you need a prescription refilled, please contact your pharmacy 1 week before your current supply will run out to request the refill.  Contacting your pharmacy is the fastest and safest way to obtain a medication refill.  The pharmacy will notify our office.  Please note, that a minimum of 48 to 72 hours is needed to refill a medication,  Please call your pharmacy early to allow enough time to refill before you anticipate running out.  For faster medication refills, you can also schedule an appointment with your provider.    We know you have  a choice in where you receive your healthcare and we sincerely thank you for trusting [facility] with your health.

## 2015-03-25 NOTE — Progress Notes (Signed)
This 24 year old female presents for evaluation of urinary symptoms including   dysuria and frequency  Duration of symptoms: 2 days    Associated symptoms?   fever/chills/sweats: Yes: chills  flank or abdominal pain: Yes: flank pain yesterday, not today  Recent antibiotics:  No            Previous cystitis or pyelonephritis: frequent  Other bladder/kidney problems: Yes: ua  Previous work-up for UTI: urinalysis and urine culture   Diabetic:  No    Sexually active/contraception: ocps and condoms  New partner(s):  NO --   Vaginitis symptoms: burning, vaginal discharge, itching - internal and itching-external  - discharge is white/grey  Possible contributing factors: none  H/O STD: none known      she has used Metrogel recently - last dose was Monday night (two days ago)    She would like a refill of Cymbalta which she has been taking for depression and anxiety.    OBJECTIVE:  BP 118/82 mmHg  Pulse 90  Temp(Src) 98.5 F (36.9 C) (Temporal)  Ht  (1.6 m)  Wt 235 lb 6.4 oz (106.777 kg)  BMI 41.71 kg/m2  SpO2 97%  LMP 03/15/2015  gait: non-antalgic   GEN: no acute distress    External genitalia: normal;  internal: Vaginal vault is normal in appearance there is a moderate amount of thick whitish discharge without odor       LAB: UA: completely normal  wet mount: no yeast, clue cells          ASSESSMENT/PLAN:  (R30.0) Dysuria  (primary encounter diagnosis)  Plan: U/A AUTO DIPSTICK ONLY, ONSITE           (F41.9) Anxiety  Plan: DULoxetine HCl 60 MG Oral CAPSULE ENTERIC         COATED PARTICLES      (N76.0) Acute vaginitis  Plan: WET PREP/KOH/TEST, ONSITE     reassurance given.

## 2015-03-25 NOTE — Progress Notes (Signed)
Reason for visit: uti   Reviewed eCare status with Patient:  NO    HEALTH MAINTENANCE:  Has the patient had any of these since their last visit?    Cervical screening/PAP: N/A     Mammo: N/A    Colon Screen: n/a    Have you seen a specialist since your last visit: No        HM Due: none  Health Maintenance   Topic Date Due   . Influenza Vaccine (1) 04/02/2015   . Pap Smear  08/30/2015   . Chlamydia Screen  09/18/2015   . Gonorrhea Screen  09/18/2015   . Tetanus Vaccine  12/26/2022   . HIV Screen  Completed   . HPV Vaccine  Completed           Future Appointments  Date Time Provider Department Center   03/25/2015 11:15 AM Sharyn Creamer [place], [place] [facility] NISQ   03/31/2015 11:00 AM Ailene Rud, MD UUROG[facility] UROLOGY   03/31/2015 11:30 AM [facility] ([place]) U[facility] PRE-SUR   04/13/2015 1:00 PM Neale Burly, MD H Gyn [facility] WOMEN'S    05/11/2015 10:40 AM Ailene Rud, MD Dian Situ

## 2015-03-31 ENCOUNTER — Ambulatory Visit
Payer: No Typology Code available for payment source | Attending: Obstetrics/Gynecology | Admitting: Obstetrics/Gynecology

## 2015-03-31 ENCOUNTER — Ambulatory Visit (HOSPITAL_BASED_OUTPATIENT_CLINIC_OR_DEPARTMENT_OTHER): Payer: No Typology Code available for payment source

## 2015-03-31 ENCOUNTER — Encounter (INDEPENDENT_AMBULATORY_CARE_PROVIDER_SITE_OTHER): Payer: Self-pay | Admitting: Hand Surgery

## 2015-03-31 VITALS — BP 127/74 | HR 82 | Ht 63.0 in | Wt 239.3 lb

## 2015-03-31 DIAGNOSIS — R3911 Hesitancy of micturition: Secondary | ICD-10-CM | POA: Insufficient documentation

## 2015-03-31 DIAGNOSIS — R3989 Other symptoms and signs involving the genitourinary system: Secondary | ICD-10-CM | POA: Insufficient documentation

## 2015-03-31 DIAGNOSIS — R102 Pelvic and perineal pain: Secondary | ICD-10-CM | POA: Insufficient documentation

## 2015-03-31 DIAGNOSIS — M62838 Other muscle spasm: Secondary | ICD-10-CM | POA: Insufficient documentation

## 2015-03-31 DIAGNOSIS — R300 Dysuria: Secondary | ICD-10-CM | POA: Insufficient documentation

## 2015-03-31 LAB — PREGNANCY TEST, URINE
Pregnancy Test, URN: NEGATIVE
Specific Gravity, URN: 1.016 g/mL (ref 1.002–1.027)

## 2015-03-31 NOTE — Patient Instructions (Signed)
Call to check on OR time if you don't hear from [place].
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
## 2015-03-31 NOTE — Progress Notes (Signed)
Preoperative education done for trigger point injection per [facility] protocol. Pt given the following literature: "Information About Your Health Care", the [facility] Pre surgery booklet and Post-op instructions.   Patient is here for pre op teaching today with her husband.  No Labs or ECG.  Multiple handouts provided to patient including: avoiding constipation, medications to avoid, recovering at home following Anesthesia.  Reviewed NPO status and medical necessity to have transportation home and responsible adult accompany patient for 24 hours.  Surgical soap with instruction provided.  Multiple questions asked and answered.  Patient agrees and confirms this surgical plan.  Call if further questions or issues, both pre- and/or post-operatively.    Tomasa Hosteller, RN.

## 2015-04-02 ENCOUNTER — Ambulatory Visit (HOSPITAL_BASED_OUTPATIENT_CLINIC_OR_DEPARTMENT_OTHER): Payer: No Typology Code available for payment source | Admitting: Obstetrics/Gynecology

## 2015-04-02 ENCOUNTER — Ambulatory Visit
Admission: RE | Admit: 2015-04-02 | Discharge: 2015-04-02 | Disposition: A | Discharge status: Home / Self Care | Payer: No Typology Code available for payment source | Attending: Obstetrics/Gynecology | Admitting: Obstetrics/Gynecology

## 2015-04-02 DIAGNOSIS — F17210 Nicotine dependence, cigarettes, uncomplicated: Secondary | ICD-10-CM | POA: Insufficient documentation

## 2015-04-02 DIAGNOSIS — F328 Other depressive episodes: Secondary | ICD-10-CM | POA: Insufficient documentation

## 2015-04-02 DIAGNOSIS — R300 Dysuria: Secondary | ICD-10-CM | POA: Insufficient documentation

## 2015-04-02 DIAGNOSIS — R102 Pelvic and perineal pain: Secondary | ICD-10-CM

## 2015-04-02 DIAGNOSIS — K449 Diaphragmatic hernia without obstruction or gangrene: Secondary | ICD-10-CM | POA: Insufficient documentation

## 2015-04-02 DIAGNOSIS — K589 Irritable bowel syndrome without diarrhea: Secondary | ICD-10-CM | POA: Insufficient documentation

## 2015-04-02 DIAGNOSIS — F4310 Post-traumatic stress disorder, unspecified: Secondary | ICD-10-CM | POA: Insufficient documentation

## 2015-04-02 DIAGNOSIS — G8929 Other chronic pain: Secondary | ICD-10-CM

## 2015-04-02 DIAGNOSIS — K219 Gastro-esophageal reflux disease without esophagitis: Secondary | ICD-10-CM | POA: Insufficient documentation

## 2015-04-02 DIAGNOSIS — M62838 Other muscle spasm: Secondary | ICD-10-CM

## 2015-04-02 DIAGNOSIS — Z6841 Body Mass Index (BMI) 40.0 and over, adult: Secondary | ICD-10-CM | POA: Insufficient documentation

## 2015-04-02 DIAGNOSIS — N368 Other specified disorders of urethra: Secondary | ICD-10-CM | POA: Insufficient documentation

## 2015-04-02 LAB — GLUCOSE POC, ~~LOC~~: Glucose (POC): 91 mg/dL (ref 62–125)

## 2015-04-07 ENCOUNTER — Telehealth (HOSPITAL_BASED_OUTPATIENT_CLINIC_OR_DEPARTMENT_OTHER): Payer: Self-pay | Admitting: Hand Surgery

## 2015-04-07 NOTE — Telephone Encounter (Signed)
CCR calling that patient wants to reschedule their surgery which is scheduled for 04/09/15.  Please follow up.    [place], PSS

## 2015-04-07 NOTE — Telephone Encounter (Signed)
Pt is calling and eager to discuss rescheduling surgery    Please call the pt at [phone]

## 2015-04-07 NOTE — Telephone Encounter (Signed)
Contacted patient and she had recent surgery last week and is taking Ibuprofen. She has rescheduled her surgery from 9.8.16 to 10.6.16    Mardene Celeste  Surgical Patient Care Coordinator  [facility]

## 2015-04-13 ENCOUNTER — Ambulatory Visit (HOSPITAL_BASED_OUTPATIENT_CLINIC_OR_DEPARTMENT_OTHER)
Payer: No Typology Code available for payment source | Attending: Obstetrics/Gynecology | Admitting: Obstetrics/Gynecology

## 2015-04-13 VITALS — BP 126/82 | HR 81 | Temp 98.1°F | Ht 63.0 in | Wt 235.0 lb

## 2015-04-13 DIAGNOSIS — N94810 Vulvar vestibulitis: Secondary | ICD-10-CM | POA: Insufficient documentation

## 2015-04-13 DIAGNOSIS — N761 Subacute and chronic vaginitis: Secondary | ICD-10-CM

## 2015-04-13 DIAGNOSIS — R102 Pelvic and perineal pain: Secondary | ICD-10-CM | POA: Insufficient documentation

## 2015-04-13 DIAGNOSIS — E282 Polycystic ovarian syndrome: Secondary | ICD-10-CM

## 2015-04-13 LAB — PR WET MOUNTS INCL PREP VAGINAL CERV/SKIN SPECIMENS, ONSITE

## 2015-04-13 LAB — PR U/A NONAUTO DIPSTICK ONLY, ONSITE
Glucose, Urine: NEGATIVE mg/dL
Ketones, URN: NEGATIVE mg/dL
Leukocytes: NEGATIVE
Nitrite, URN: NEGATIVE
Protein: NEGATIVE mg/dL
Urobilinogen, URN: 0.2 E.U./dL (ref 0.2–1.0)
pH, URN (UWNC): 5 (ref 5.0–8.0)

## 2015-04-13 LAB — PR ALL POTASSIUM HYDROXIDE PREPARATIONS

## 2015-04-13 MED ORDER — METFORMIN HCL 500 MG OR TABS
ORAL_TABLET | ORAL | Status: DC
Start: 2015-04-13 — End: 2015-06-29

## 2015-04-13 NOTE — Patient Instructions (Signed)
Metformin:  Take 1/2 pill /day x 8 days (4 pills)-usually start in the evening), then one pill / day x one month, then one pill twice a day.    Okay to stop the clobetasol  Continue with the oral contraceptives.  Go to once a week for the metrogel

## 2015-04-13 NOTE — Progress Notes (Signed)
BP 126/82 mmHg  Pulse 81  Temp(Src) 98.1 F (36.7 C) (Temporal)  Ht  (1.6 m)  Wt 235 lb (106.595 kg)  BMI 41.64 kg/m2  LMP 04/11/2015  This note was dictated by Neale Burly, MD.  Dip urine:  No LE.  No nitrite   +RBC

## 2015-04-14 NOTE — Progress Notes (Signed)
Kristen Salinas, Kristen Salinas          [id number]          04/13/2015      WOMEN'S CLINIC NOTE     This is a 24 year old female who presents today for followup.  The patient has been followed by me for chronic bacterial vaginosis as well as vestibulitis and abnormal bleeding.  The patient also has a history of polycystic ovarian syndrome, and  Presents today  to discuss the potential for metformin use.    HISTORY OF PRESENT ILLNESS     The patient is using  1)  MetroGel twice a week for BV prevention, and she has had no symptoms of BV.    2) She took fluconazole once after her July appointment, but otherwise is not taking it and is having no symptoms of yeast, and self discontinued fluconazole about 6 weeks ago.    3) The patient was on clobetasol for vestibulitis, but did not have improvement from this, and has stopped it ,   4) but she has also seen Dr. 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  She is set for followup with Dr. Craige Cotta in October and the trigger point injection markedly helped her pelvic floor pain, but she thinks it did not completely resolve and she may need another one.  The patient has been able to have intercourse.   5)  As far as her oral contraceptive use, she is continuing to use them.  She has had some breakthrough bleeding this month and is on day 19 of her pill package with irregular intermenstrual bleeding.  However, she is willing to continue on the pills, hoping that it will normalize.  6) frequent UTI's - no sx currently    The patient is having no dysuria today, frequency or burning, and no back pain and has tolerated intercourse without discomfort.    At her last visit, we approached the idea of polycystic ovarian syndrome and potential metformin.  The patient has thought about this,  talked with her primary care physician, and would like to proceed.  We  discussed the main side effects being gastrointestinal and the patient understands this.  She understands that it alone is not an adequate weight loss tool, that she also needs to control her diet and do exercise and that we are using it to try to help regulate her insulin release.  With the oral contraceptives, we will not be able to tell if it causes her oligomenorrhea to resolve.  However, it could help with polycystic ovarian syndrome, even though we cannot assess the bleeding due to oral contraceptive use.    Other medications include trazodone and hydroxyzine.    The patient also has carpal tunnel problems, and is set to get surgery for carpal tunnel in October.  Until then, she is not working.    SOCIAL HISTORY     The patient continues with her same partner.  They have recently found a motor home that they are going to be moving into, which they hope will provide stable housing.        PHYSICAL EXAMINATION     GENERAL:  Today, the patient is well-appearing.  She is smiling.  She is present with her significant other.  PELVIC:  On directed pelvic exam, the patient has evidence of menstrual flow at her os.  She has very little erythema in the vestibule and some mild point tenderness at 8 o'clock, which is where she has had tenderness before, but overall, the tenderness in the vestibule region is much improved since her last visit in July.  She also has a wet mount collected and a yeast culture.    On wet mount, she had no evidence of bacterial vaginosis with normal epithelial cells.  Many red blood cells were present.  A yeast culture was sent.    ASSESSMENT/PLAN     1. Bacterial vaginosis, chronic:  Doing well with MetroGel twice weekly prevention.  She has been on this regimen now for 4 months, so we will have her go to once a week.  2. Yeast.  No symptoms currently and has not been using any medication since July, so we will not start fluconazole at this time.  Culture sent  3. Vestibulitis.  She is off  clobetasol.  She did get trigger point injections and currently feels better and may be helping, so will not use topical steroids at this time.  4. Pelvic floor muscle spasm.  The patient is seeing Dr. Autumn Messing for this.  Please see her notes.  5. Irregular bleeding.  She is on oral contraceptives.  Continue to take oral contraceptives.  Reviewed the need to take them every day and reassurance offered.  6. Polycystic ovarian syndrome.  We discussed the pathology of it, the multifactorial approach to its treatment and the potential assistance offered by metformin.  Discussed metformin side effects extensively, and the patient agrees to start metformin.  She will start with a low dose of 250 mg in the evening for 8 doses and then move to 500 mg a day in the evening for a month, and then 500 twice a day.  Multiple questions were answered.    The patient will return to clinic in 3 months.

## 2015-04-15 ENCOUNTER — Other Ambulatory Visit: Payer: Self-pay | Admitting: Pharmacist

## 2015-04-15 DIAGNOSIS — F419 Anxiety disorder, unspecified: Secondary | ICD-10-CM

## 2015-04-15 DIAGNOSIS — F5101 Primary insomnia: Secondary | ICD-10-CM

## 2015-04-15 MED ORDER — HYDROXYZINE PAMOATE 50 MG OR CAPS
50.0000 mg | ORAL_CAPSULE | Freq: Every evening | ORAL | Status: DC
Start: 2015-04-15 — End: 2015-07-22

## 2015-04-15 NOTE — Telephone Encounter (Signed)
Patient last seen on 03/25/15 for anxiety

## 2015-04-16 LAB — R/O YEAST CULT W/DIRECT EXAM: Stain For Fungus: NONE SEEN

## 2015-04-20 ENCOUNTER — Encounter (INDEPENDENT_AMBULATORY_CARE_PROVIDER_SITE_OTHER): Payer: No Typology Code available for payment source | Admitting: Physician Assistant

## 2015-04-24 ENCOUNTER — Encounter (INDEPENDENT_AMBULATORY_CARE_PROVIDER_SITE_OTHER): Payer: No Typology Code available for payment source | Admitting: Family

## 2015-04-24 ENCOUNTER — Telehealth (HOSPITAL_BASED_OUTPATIENT_CLINIC_OR_DEPARTMENT_OTHER): Payer: Self-pay | Admitting: Obstetrics/Gynecology

## 2015-04-24 ENCOUNTER — Encounter (INDEPENDENT_AMBULATORY_CARE_PROVIDER_SITE_OTHER): Payer: Self-pay | Admitting: Family Medicine

## 2015-04-24 ENCOUNTER — Ambulatory Visit (INDEPENDENT_AMBULATORY_CARE_PROVIDER_SITE_OTHER): Payer: No Typology Code available for payment source | Admitting: Family Medicine

## 2015-04-24 VITALS — BP 126/85 | HR 93 | Temp 98.9°F | Ht 63.0 in | Wt 236.0 lb

## 2015-04-24 DIAGNOSIS — N760 Acute vaginitis: Secondary | ICD-10-CM

## 2015-04-24 LAB — PR U/A AUTO DIPSTICK ONLY, ONSITE
Bilirubin, Urine: NEGATIVE
Glucose, Urine: NEGATIVE mg/dL
Ketones, URN: NEGATIVE mg/dL
Leukocytes: NEGATIVE
Nitrite, URN: NEGATIVE
Occult Blood, URN: NEGATIVE
Protein: NEGATIVE mg/dL
Specific Gravity, Urine: 1.015 (ref 1.005–1.030)
Urobilinogen, URN: 0.2 E.U./dL (ref 0.2–1.0)
pH, URN: 5.5 (ref 5.0–8.0)

## 2015-04-24 MED ORDER — FLUCONAZOLE 150 MG OR TABS
150.0000 mg | ORAL_TABLET | Freq: Once | ORAL | Status: AC
Start: 2015-04-24 — End: 2015-04-24

## 2015-04-24 NOTE — Telephone Encounter (Signed)
Discussed message with Dr. Craige Cotta who stated that this is unrelated to the trigger point injection procedure done on 04/02/15. Patient should plan on following up with PCP today as planned and tell the provider that she had the trigger point injection done on 9/1. Patient should keep her followup with Dr. Craige Cotta on 05/11/15 as scheduled.  Gave patient this information. Patient stated that she will do as advised.

## 2015-04-24 NOTE — Telephone Encounter (Signed)
Situation:  Patient is experiencing foul, yellow/green vaginal discharge along with vaginal itching and burning.  Background: S/P 3 weeks trigger point injection of levator ani muscles and vestibule, DOS: 04/02/15.  Assessment: Patient states that this is now the second episode following the trigger point injections. Patient states that the first episode happened on 04/22/15 and then again today. The episode on 04/22/15 happened when she became sexually aroused and then today the patient states that she had just voided and noticed the foul (fishy smell) and yellow/greenish vaginal discharge when she wiped. Patient states that today is also having vaginal burning and itching. Patient states that she has intermittent fever and chills along with some nausea. Patient is scheduled to see her PCP today for this issue.  Recommendation:  Sending TE to Dr. Craige Cotta for her response.

## 2015-04-24 NOTE — Patient Instructions (Signed)
We're Moving!!  As of January 2017 our new address will be [address] in [place].      It was a pleasure to see you in clinic today.                If you are not yet signed up for eCare, please see the below eCare section at the end of this document for how to enroll and to get your access codes.  It's easy to sign up at home today.    eCare  enrollment will allow you access to the below benefits   You can make appointments online   View test results / Lab Results   Request prescription renewals   Obtain a copy of our After Visit Summary (an electronic copy of this document)     Your Test Results:  If labs were ordered today the results are expected to be available via eCare in about 5 days. If you have an active eCare account, this is how we will notify you of your results.     If you do not have an eCare account then your test results will be mailed to you within about 14 days after your tests are completed. If your physician needs to change your care based on your results or is concerned, you should expect a phone call or eCare message from your provider.    If you have any questions about your test results please schedule an appointment with your provider, so that we may review them with you in greater detail.    **If it has been more than 2 weeks and you have not received your test results please send our office a message via eCare.    Medication Refills: If you need a prescription refilled, please contact your pharmacy 1 week before your current supply will run out to request the refill.  Contacting your pharmacy is the fastest and safest way to obtain a medication refill.  The pharmacy will notify our office.  Please note, that a minimum of 48 to 72 hours is needed to refill a medication,  Please call your pharmacy early to allow enough time to refill before you anticipate running out.  For faster medication refills, you can also schedule an appointment with your provider.    We know you have  a choice in where you receive your healthcare and we sincerely thank you for trusting [facility] with your health.

## 2015-04-24 NOTE — Progress Notes (Signed)
Reason for visit: vaginal discharge, vaginitis and Possible UTI    Reviewed eCare status with Patient:  YES    HEALTH MAINTENANCE:  Has the patient had any of these since their last visit?    Cervical screening/PAP: UTD     Mammo: N/A    Colon Screen: N/A    Have you seen a specialist since your last visit: No        HM Due:   Health Maintenance   Topic Date Due   . Influenza Vaccine (1) 04/02/2015   . Pap Smear  08/30/2015   . Chlamydia Screen  09/18/2015   . Gonorrhea Screen  09/18/2015   . Tetanus Vaccine  12/26/2022   . HIV Screen  Completed   . HPV Vaccine  Completed           Future Appointments  Date Time Provider Department Center   04/24/2015 12:40 PM Diddee, Deatra James, MD [facility] N[facility]Q   05/11/2015 10:40 AM Ailene Rud, MD U[facility]   05/18/2015 9:20 AM Carles Collet, PA-C [facility]   07/13/2015 1:00 PM Neale Burly, MD H Gyn [facility] WOMEN'S

## 2015-04-24 NOTE — Telephone Encounter (Signed)
(  TEXTING IS AN OPTION FOR [facility] CLINICS ONLY)  Is this a [facility] clinic? No      RETURN CALL: Detailed message on voicemail only      SUBJECT:  General Message     REASON FOR REQUEST: Post-op Problem    MESSAGE: Patient had trigger point injection at beginning of month has now had two incidents of yellowish green vaginal discharge along with itching, burning, and odor.  This began on Tuesday first incident was after sexual arousal second incident was this morning.

## 2015-04-26 NOTE — Progress Notes (Signed)
SUBJECTIVE:   24 year old female complains of vaginal itching with white discharge for  3 days.    Denies abnormal vaginal bleeding or significant pelvic pain or  fever. Does have UTI symptoms-slight burning and frequency on and off for weeks. Does not have history of known exposure to STD.  Does not have dyspareunia.      Has been seeing a gynecologist for these symptoms  Patient's last menstrual period was 04/14/2015.    OBJECTIVE:   General: well nourished, well developed, no distress  Vitals as noted above  Pelvic Exam: EGBUS within normal limits, normal cervix without lesions, polyps or tenderness, uterus normal size, shape, consistency, no mass or tenderness and adnexa normal in size without mass or tenderness.    Data: Wet prep shows: hyphae: occasional (1+)  Urine dipstick: negative for all components.    ASSESSMENT:   yeast vaginitis    PLAN:   Per orders.  ROV prn if symptoms persist or worsen.    Recommend she followup with her gyn for persistent symptoms

## 2015-04-27 LAB — PR WET PREP/KOH/TEST, ONSITE
Clue Cells: NEGATIVE
Odor: ABSENT
Trich: NEGATIVE

## 2015-04-29 ENCOUNTER — Ambulatory Visit: Payer: Self-pay

## 2015-04-29 NOTE — Telephone Encounter (Signed)
[facility] in [place] is calling to ask about a Rx that was sent in today for MetroNIDAZOLE 0.75 % Vaginal Gel    The pharmacy is not able to read the name of the provider who ordered this medication  Clinic, please call [facility] today at [phone]  To clarify this Rx

## 2015-04-29 NOTE — Telephone Encounter (Signed)
Protocol: MEDICATION QUESTION CALL-ADULT-AH  Affirmative: Caller has NON-URGENT medication question about med that PCP prescribed and triager unable to answer question  Disposition of Call PCP Within 24 Hours suggested.

## 2015-05-06 ENCOUNTER — Other Ambulatory Visit (HOSPITAL_BASED_OUTPATIENT_CLINIC_OR_DEPARTMENT_OTHER): Payer: Self-pay | Admitting: Physician Assistant

## 2015-05-06 DIAGNOSIS — G5601 Carpal tunnel syndrome, right upper limb: Secondary | ICD-10-CM

## 2015-05-07 ENCOUNTER — Ambulatory Visit (HOSPITAL_BASED_OUTPATIENT_CLINIC_OR_DEPARTMENT_OTHER): Payer: No Typology Code available for payment source | Admitting: Hand Surgery

## 2015-05-07 ENCOUNTER — Ambulatory Visit
Admission: RE | Admit: 2015-05-07 | Discharge: 2015-05-07 | Disposition: A | Discharge status: Home / Self Care | Payer: No Typology Code available for payment source | Attending: Hand Surgery | Admitting: Hand Surgery

## 2015-05-07 DIAGNOSIS — Z6841 Body Mass Index (BMI) 40.0 and over, adult: Secondary | ICD-10-CM | POA: Insufficient documentation

## 2015-05-07 DIAGNOSIS — F17210 Nicotine dependence, cigarettes, uncomplicated: Secondary | ICD-10-CM | POA: Insufficient documentation

## 2015-05-07 DIAGNOSIS — K219 Gastro-esophageal reflux disease without esophagitis: Secondary | ICD-10-CM | POA: Insufficient documentation

## 2015-05-07 DIAGNOSIS — F408 Other phobic anxiety disorders: Secondary | ICD-10-CM | POA: Insufficient documentation

## 2015-05-07 DIAGNOSIS — G5601 Carpal tunnel syndrome, right upper limb: Secondary | ICD-10-CM | POA: Insufficient documentation

## 2015-05-07 DIAGNOSIS — G4489 Other headache syndrome: Secondary | ICD-10-CM | POA: Insufficient documentation

## 2015-05-07 DIAGNOSIS — Z888 Allergy status to other drugs, medicaments and biological substances status: Secondary | ICD-10-CM | POA: Insufficient documentation

## 2015-05-11 ENCOUNTER — Ambulatory Visit
Payer: No Typology Code available for payment source | Attending: Physician Assistant | Admitting: Rehabilitative and Restorative Service Providers"

## 2015-05-11 ENCOUNTER — Encounter (INDEPENDENT_AMBULATORY_CARE_PROVIDER_SITE_OTHER): Payer: Self-pay | Admitting: Obstetrics/Gynecology

## 2015-05-11 ENCOUNTER — Ambulatory Visit (HOSPITAL_BASED_OUTPATIENT_CLINIC_OR_DEPARTMENT_OTHER)
Payer: No Typology Code available for payment source | Attending: Obstetrics/Gynecology | Admitting: Obstetrics/Gynecology

## 2015-05-11 ENCOUNTER — Encounter (HOSPITAL_BASED_OUTPATIENT_CLINIC_OR_DEPARTMENT_OTHER): Payer: No Typology Code available for payment source | Admitting: Rehabilitative and Restorative Service Providers"

## 2015-05-11 ENCOUNTER — Encounter (INDEPENDENT_AMBULATORY_CARE_PROVIDER_SITE_OTHER): Payer: No Typology Code available for payment source | Admitting: Obstetrics/Gynecology

## 2015-05-11 ENCOUNTER — Ambulatory Visit: Payer: No Typology Code available for payment source | Admitting: Obstetrics/Gynecology

## 2015-05-11 VITALS — BP 123/81 | HR 83 | Resp 16 | Ht 62.99 in | Wt 242.0 lb

## 2015-05-11 VITALS — BP 120/78 | HR 97 | Temp 99.0°F | Ht 63.0 in | Wt 242.0 lb

## 2015-05-11 DIAGNOSIS — M25631 Stiffness of right wrist, not elsewhere classified: Secondary | ICD-10-CM | POA: Insufficient documentation

## 2015-05-11 DIAGNOSIS — N761 Subacute and chronic vaginitis: Secondary | ICD-10-CM | POA: Insufficient documentation

## 2015-05-11 DIAGNOSIS — M79641 Pain in right hand: Secondary | ICD-10-CM | POA: Insufficient documentation

## 2015-05-11 DIAGNOSIS — R339 Retention of urine, unspecified: Secondary | ICD-10-CM

## 2015-05-11 DIAGNOSIS — Z4789 Encounter for other orthopedic aftercare: Secondary | ICD-10-CM | POA: Insufficient documentation

## 2015-05-11 DIAGNOSIS — G5602 Carpal tunnel syndrome, left upper limb: Secondary | ICD-10-CM | POA: Insufficient documentation

## 2015-05-11 DIAGNOSIS — R102 Pelvic and perineal pain: Secondary | ICD-10-CM

## 2015-05-11 DIAGNOSIS — N94810 Vulvar vestibulitis: Secondary | ICD-10-CM | POA: Insufficient documentation

## 2015-05-11 DIAGNOSIS — M62838 Other muscle spasm: Secondary | ICD-10-CM

## 2015-05-11 DIAGNOSIS — G5601 Carpal tunnel syndrome, right upper limb: Secondary | ICD-10-CM | POA: Insufficient documentation

## 2015-05-11 LAB — PR WET MOUNTS INCL PREP VAGINAL CERV/SKIN SPECIMENS, ONSITE

## 2015-05-11 LAB — PR ALL POTASSIUM HYDROXIDE PREPARATIONS

## 2015-05-11 NOTE — Progress Notes (Signed)
BP 120/78 mmHg  Pulse 97  Temp(Src) 99 F (37.2 C) (Temporal)  Ht  (1.6 m)  Wt 242 lb (109.77 kg)  BMI 42.88 kg/m2  LMP 05/10/2015  This note was dictated by Neale Burly, MD.

## 2015-05-11 NOTE — Patient Instructions (Signed)
Please look up low glycemic index foods.    Free aps on phone to help with calories.  Fitness pal

## 2015-05-11 NOTE — Patient Instructions (Signed)
Call Katheren Puller next week if you have not heard from her: [phone] to schedule another set of trigger point injections.

## 2015-05-11 NOTE — Progress Notes (Signed)
[facility]  Chief Complaint   Patient presents with   . Follow-Up      post-op       History:  24 year old female presents today for follow up for levator spasm and vulvar pain.  Previously was most bothered by pain with arousal.  Since trigger point injections of levators and vestibule 9/1, she has had a big decrease in vestibular pain (more of an itch now) and levator pain (decreased deep pain, no pain with arousal, has only had sex once since injections but no pain).  Felt better immediately after the injections in PACU.  Had yeast infection 9/23 treated and improved.    Last night, she noticed increased urinary frequency and a sense of incomplete bladder emptying.  Urine culture sent by Dr. Dolphus Jenny today.     Exam:    Vitals: BP 123/81 mmHg  Pulse 83  Resp 16  Ht 5' 2.99" (1.6 m)  Wt 242 lb (109.77 kg)  BMI 42.88 kg/m2  LMP 05/10/2015  Neuro/psych:  Alert and oriented x 3 in a pleasant mood  Skin: Warm, dry and intact  Arm: bandage in place from carpal tunnel surgery last week    Procedures:   PVR of 0 mL obtained by bladder scan.  Assessment:  (R33.9) Incomplete bladder emptying  (primary encounter diagnosis) - no urinary retention confirmed by normal postvoid residual  (M62.838) Levator spasm  (R10.2) Pelvic pain in female     Plan:   Repeat levator and vestibular trigger point injections in the OR.    I spent a total time of 15 minutes face-to-face with the patient, of which more than 50% was spent counseling and coordinating care as outlined in this note.

## 2015-05-12 NOTE — Progress Notes (Signed)
Kristen, Salinas          [id number]          05/11/2015      [facility]OLOGY CLINIC       SUBJECTIVE     This is a 24 year old female who is followed by me for vestibulitis and vaginitis.  Her prior visit to me in September, the patient was on MetroGel twice a week for BV prevention, and also had fluconazole 200 mg that she can take once a week for chronic yeast.  She also had triamcinolone to use for vestibulitis.    At that time, I started her on metformin for PCOS and she is also on oral contraceptives for period control and contraception.  The patient states she did tolerate the metformin starting at 250 mg for 2 weeks.  She had some nausea, but this gradually went away, just very few episodes of diarrhea, and was up to 500 mg twice a day.  She read something on the Internet that said it could change her periods, which she did not want, and so she stopped it 3 days ago.  She also reports that she has not used the triamcinolone in a few weeks, as the vestibulitis pain is better. She has not used MetroGel in 2 months.    About 2 weeks ago, she did have a period of increased white discharge and itching and burning.  She went to see her primary provider who did a test and that came back 2+ Candida albicans so she gave her 1 dose of fluconazole 150 mg.  The patient went on to take her own stock of 200 mg of fluconazole pills every 3 days, 2 pills, so she had a total of 10 days of treatment. The last fluconazole pill was about a week ago.    Since this time, she has had normalization of her vaginal discharge; it is white, she has very minimal itching and burning.    One concern today is her periods while on the oral contraceptives, which she states she does take every day.  She has had a period during the placebo pill, but this time, she is on day 5 of the placebo pill when she got some bleeding that was heavy and then now on day 6 of the placebo pill the bleeding is gone. She is wondering if it is okay to have this  type of bleeding pattern.    The patient is also followed by an Kristen Salinas for pelvic floor pain and underwent Botox injection.  She is going to see Dr. Craige Salinas today to assess whether she should have another injection.    OTHER INTERVENING HISTORY     The patient had right carpal tunnel surgery four days ago and is feeling very minimal pain from that.  No other changes in her history.  Her mood, she states, is good.    PHYSICAL EXAMINATION     VITAL SIGNS:  Blood pressure is 120/78.  Her weight is up 6 pounds to 242 with a BMI of 42.  HAND:  The right hand is in a hard cast.  PELVIC:  On directed pelvic exam, the patient has normal external genitalia.  Her vestibule is not red nor tender to Q-Tip touch and is completely normal in appearance.  Her hymenal ring is also nontender without erythema.  Placing the speculum, which the patient tolerated without pain, her vaginal sidewalls are pink and well rugated, normal in color, with a physiologic-appearing discharge that  is white with a pH of 4.0.  A wet mount as well as a yeast culture were collected.  On bimanual, the patient's pelvic sidewalls are less tight, but she still has some levator tenderness and tightness on her left more than her right.    ASSESSMENT     1. Vestibulitis markedly improved.  No treatment now for a few weeks and very minimal symptoms.  Will observe currently.  2. Bacterial vaginosis.  No evidence of this on wet mount today as her wet mount was completely normal epithelial cells, so it is okay that she self-discontinued the MetroGel.  3. Yeast.  No evidence of this today.  Culture was sent.  If this is positive, would suggest the patient  stay on weekly fluconazole for prevention as boric acid has irritated her in the past.  4. Levator spasm.  She will approach Dr. Craige Salinas about this later today.  5. PCOS.  Encouraged the patient to take the metformin.  She states she will restart.  Also told her about low glycemic index foods and wrote down that  name for her so she could look up those types of foods.  I encouraged her to use an app on her phone such as My Fitness Pal and increase output relative to the input of calories.  Also encouraged her that for overall well-being getting her BMI down is extremely significant.  Patient voiced understanding.  She was present with her boyfriend  6. The patient is concerned that she might have a urinary tract infection, so urine culture was sent.  The dip urine was negative.

## 2015-05-13 ENCOUNTER — Encounter (HOSPITAL_BASED_OUTPATIENT_CLINIC_OR_DEPARTMENT_OTHER): Payer: Self-pay | Admitting: Obstetrics/Gynecology

## 2015-05-13 DIAGNOSIS — N3000 Acute cystitis without hematuria: Secondary | ICD-10-CM

## 2015-05-13 MED ORDER — NITROFURANTOIN MONOHYD MACRO 100 MG OR CAPS
100.0000 mg | ORAL_CAPSULE | Freq: Two times a day (BID) | ORAL | Status: DC
Start: 2015-05-13 — End: 2015-08-04

## 2015-05-14 ENCOUNTER — Encounter (INDEPENDENT_AMBULATORY_CARE_PROVIDER_SITE_OTHER): Payer: No Typology Code available for payment source | Admitting: Family Medicine

## 2015-05-14 ENCOUNTER — Telehealth (INDEPENDENT_AMBULATORY_CARE_PROVIDER_SITE_OTHER): Payer: Self-pay | Admitting: Physician Assistant

## 2015-05-14 ENCOUNTER — Encounter (HOSPITAL_BASED_OUTPATIENT_CLINIC_OR_DEPARTMENT_OTHER): Payer: Self-pay | Admitting: Obstetrics/Gynecology

## 2015-05-14 LAB — URINE C/S
Culture: 50000
Culture: 50000
Culture: 7000

## 2015-05-14 LAB — R/O YEAST CULT W/DIRECT EXAM: Stain For Fungus: NONE SEEN

## 2015-05-14 NOTE — Telephone Encounter (Signed)
(  TEXTING IS AN OPTION FOR [facility] CLINICS ONLY)  Is this a [facility] clinic? No      RETURN CALL: Detailed message on voicemail only      SUBJECT:  Cancellation/Reschedule Request     ORIGINAL APPOINTMENT DATE: 05/18/15, TIME: 9:20am  REASON: The patient has a family emergency and is having to fly out of town this weekend. She will not be able to make her suture removal appointment.   REQUESTED DATE: Today or Tomorrow, TIME: Anytime  UNABLE TO APPOINT BECAUSE: PER SOP post-op pappointment    The patient is also needing to know, if an appointment today or tomorrow is not possible, is it OK for her sutures to remain in until she returns? CCR attempted to warm transfer, however care team was assisting other patients. Please follow up at your earliest convenience, thank you!

## 2015-05-18 ENCOUNTER — Encounter (INDEPENDENT_AMBULATORY_CARE_PROVIDER_SITE_OTHER): Payer: No Typology Code available for payment source | Admitting: Physician Assistant

## 2015-05-18 ENCOUNTER — Encounter (HOSPITAL_BASED_OUTPATIENT_CLINIC_OR_DEPARTMENT_OTHER): Payer: Self-pay | Admitting: Obstetrics/Gynecology

## 2015-05-20 ENCOUNTER — Other Ambulatory Visit: Payer: Self-pay

## 2015-05-25 ENCOUNTER — Encounter (INDEPENDENT_AMBULATORY_CARE_PROVIDER_SITE_OTHER): Payer: Self-pay | Admitting: Physician Assistant

## 2015-05-25 ENCOUNTER — Ambulatory Visit: Payer: No Typology Code available for payment source | Attending: Physician Assistant | Admitting: Physician Assistant

## 2015-05-25 ENCOUNTER — Encounter (HOSPITAL_BASED_OUTPATIENT_CLINIC_OR_DEPARTMENT_OTHER): Payer: No Typology Code available for payment source | Admitting: Obstetrics & Gynecology

## 2015-05-25 VITALS — BP 121/74 | HR 78 | Temp 98.7°F | Ht 62.99 in | Wt 242.0 lb

## 2015-05-25 DIAGNOSIS — G5601 Carpal tunnel syndrome, right upper limb: Secondary | ICD-10-CM

## 2015-05-25 NOTE — Progress Notes (Signed)
Kristen Salinas is a 24 year old female who is now 2.5 weeks s/p right carpal tunnel release. Her surgery date was 05/07/15. She currently rates her pain as 4 out of 10. She denies any numbness in the fingers other than the finger tips.  Denies any fevers or chills and Denies erythema or purulent drainage. She removed her splint already and admits to removing her sutures herself last week. Patient rates her satisfaction with this procedure as 6/10.    EXAM  Incision is clean and dry, intact, no dehiscence, normal peri-incisional mild erythema .  Sensation to light touch is intact over the median, radial, and ulnar nerve distribution.  Minimal swelling over right hand    IMPRESSION  2.5 weeks s/p right carpal tunnel release    PLAN  Referral to hand therapy.  Activities as tolerated  Follow-up prn or if interested in L CTR ( Previous EMG with Left motor latency for median nerve 4.1 ms, sensory latency 4.2.)    Kristen LinesAmanda Porter Nakama, MS PA-C  [facility]  Department of Orthopedics and Sports Medicine  Hand, Wrist, and Elbow Surgery

## 2015-06-01 ENCOUNTER — Ambulatory Visit (HOSPITAL_BASED_OUTPATIENT_CLINIC_OR_DEPARTMENT_OTHER): Payer: No Typology Code available for payment source | Admitting: Rehabilitative and Restorative Service Providers"

## 2015-06-01 ENCOUNTER — Encounter (INDEPENDENT_AMBULATORY_CARE_PROVIDER_SITE_OTHER): Payer: No Typology Code available for payment source | Admitting: Physician Assistant

## 2015-06-01 ENCOUNTER — Encounter (HOSPITAL_BASED_OUTPATIENT_CLINIC_OR_DEPARTMENT_OTHER): Payer: No Typology Code available for payment source | Admitting: Obstetrics/Gynecology

## 2015-06-01 DIAGNOSIS — G5602 Carpal tunnel syndrome, left upper limb: Secondary | ICD-10-CM

## 2015-06-01 DIAGNOSIS — G5601 Carpal tunnel syndrome, right upper limb: Secondary | ICD-10-CM

## 2015-06-01 DIAGNOSIS — M79641 Pain in right hand: Secondary | ICD-10-CM

## 2015-06-01 DIAGNOSIS — M25631 Stiffness of right wrist, not elsewhere classified: Secondary | ICD-10-CM

## 2015-06-01 DIAGNOSIS — Z4789 Encounter for other orthopedic aftercare: Secondary | ICD-10-CM

## 2015-06-01 NOTE — Progress Notes (Signed)
Occupational Therapy Evaluation: [facility]     Today's date: 06/01/2015  Time In:    1200          Time Out:   1230            Duration: 30 minutes    Start of care date: 06/01/2015      Onset date: 05/07/2015    Referring Provider: Carles Collet  Referring provider NPI: [id number]    Patient Identifiers:  The following patient identifiers were confirmed with the patient: name and date of birth.    Order/Reason for referral: Please evaluate and treat, including: Services as deemed appropriate by the therapist including wound care, edema control, scar management and AROM, PROM of wrist and fingers.    Diagnosis: s/p right carpal tunnel release on 05/07/2015    Precautions: None    History: Patient is a 24 year old right hand dominant female who reports bilateral carpal tunnel syndrome on/off since she was 32, and worsened this past year. She underwent right open carpal tunnel release on 05/07/2015, and is planning to have surgery for the left side in the future. She reports her right median nerve paresthesia is "at least 95% better" since surgery, but reports significant pain that wakes her up at night. 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 She enjoys reading, using her phone, and spending time with her dog. 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     Initial assessment:  Fall Risk: The patient reports she fell recently, stating that she just looked to the ground and knew she was going to fall. She denies injury, but she was told that she might fall after this surgery. She will monitor if falls continue to happen and discuss with her doctor.       Barriers to learning: none  Preferred learning style: demonstration, written and verbal  Cultural Practices Influencing care: none    Level of function at start of care: Patient reports pain and stiffness in her right upper extremity with activities that require heavy lifting, grasping, twisting or weight bearing,  such as opening new/tight jar lids, doing heavy household chores, chopping food for meal preparation, and lifting a heavy pot from stove.     Skilled Services, Interventions provided today: Occupational Therapy evaluation for 10 minutes and Manual Therapy for 20 minutes. History taken with evaluation of motion, edema, symptoms and function. Dense and painful scar tissue noted over right palm. Incision is fully healed. Patient reports pillar pain in both thenar and hypothenar, but denies hypersensitivity. Ultrasound to right volar hand and wrist (100% continuous, 3 mHz, 1.0 watts per cm2, 5 minutes) to increase tissue elasticity, increase circulation and decrease pain. Soft tissue mobilization to right hand and wrist with scar massage to incision to break up adhesions, increase circulation and decrease pain. Pain-free wrist stretches performed, and patient instructed in home exercise program including scar management, soft tissue mobilization, active range of motion and gentle stretching, activities as tolerated. Patient verbalized understanding of all instructions today.     Current Impairments:  Pain level: The patient is reporting 6-8/10 level of pain, on a 0-10 Numeric Pain Distress Scale.  Range of Motion: Right wrist extension/flexion 67/57  Strength: Not tested due to pain severity.   Sensation: Patient reports right median nerve paresthesia are "at least 95% better" since surgery.     Assessment: Patient is presenting 3-4 weeks s/p right open carpal tunnel release with significant  pain and dense scar tissue. Patient will likely benefit from skilled occupational therapy to maximize range of motion, strength and functional use of her right upper extremity.     Home exercise instruction: The patient was provided with written and illustrated home program instructions and therapist-guided practice with the following exercises to do at home: as above. The patient will perform the instructed home program as noted  above.  This patient participated in these instructions by: verbalizing appropriate understanding of the exercises and any precautions    Plan of care:   Frequency/Duration: This patient will be seen for 1 therapy sessions per week for 6 weeks.  The therapy will include: therapeutic exercises , manual therapy techniques and ultrasound    Rehab potential: Excellent    Goals:   Long term functional goals: (Hand)  Open a jar lid with involved hand by 12 weeks. Carry bag down at side by 12 weeks. Do heavy household chores by 12 weeks.  Lift full pot off the stove by 12 weeks.    These goals were discussed and the patient agrees with them.    This patient may also been treated by the Occupational Therapy Assistant. The plan of care has been reviewed with her.

## 2015-06-03 ENCOUNTER — Encounter (HOSPITAL_BASED_OUTPATIENT_CLINIC_OR_DEPARTMENT_OTHER): Payer: Self-pay | Admitting: Obstetrics & Gynecology

## 2015-06-03 ENCOUNTER — Telehealth (HOSPITAL_BASED_OUTPATIENT_CLINIC_OR_DEPARTMENT_OTHER): Payer: Self-pay | Admitting: Obstetrics/Gynecology

## 2015-06-03 ENCOUNTER — Ambulatory Visit (HOSPITAL_BASED_OUTPATIENT_CLINIC_OR_DEPARTMENT_OTHER)
Payer: No Typology Code available for payment source | Attending: Obstetrics/Gynecology | Admitting: Obstetrics & Gynecology

## 2015-06-03 VITALS — BP 132/87 | HR 87 | Temp 99.3°F | Ht 62.0 in | Wt 246.0 lb

## 2015-06-03 DIAGNOSIS — R300 Dysuria: Secondary | ICD-10-CM

## 2015-06-03 LAB — PR WET MOUNTS INCL PREP VAGINAL CERV/SKIN SPECIMENS, ONSITE
Clue Cells: 0
Odor: NEGATIVE
Trich: 0
WBC: 0
Yeast, URN: 0
pH, FLD: 4

## 2015-06-03 NOTE — Telephone Encounter (Signed)
(  TEXTING IS AN OPTION FOR [facility]ICS ONLY)  Is this a [facility] clinic? No      RETURN CALL: General message OK      SUBJECT:  Appointment Request     REASON FOR REQUEST/SYMPTOMS: vaginal irritation, burning when urinating  REFERRING PROVIDER: n/a  REQUEST APPOINTMENT WITH: Dr. Marye RoundLinda Eckert  REQUESTED DATE: today 06/03/2015 or next week, TIME: any time today, CCR did not clarify for next week  UNABLE TO APPOINT BECAUSE: Nothing available with Dr. Dolphus JennyEckert today or next week. Patient would like to be seen sooner as her symptoms are of concern to her. CCR tried to warm transfer but did not get anyone on the Care Team. CCR mentioned Urgent Care, but patient would like to speak to her Care Team.
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
## 2015-06-03 NOTE — Telephone Encounter (Signed)
RN returned pt's phone call in order to offer her an appointment for her recurring UTI/vaginitis sxs. Pt accepts appointment for this afternoon.

## 2015-06-03 NOTE — Patient Instructions (Signed)
[facility].   If you have any questions about your visit please call the triage nurse at [phone].     We will coordinate with Dr. Craige CottaKirby to discuss treatment of bacteria in the urine.  We will let you know if the urine and/or yeast cultures come back positive and, if so, we will initiate treatment at that time.

## 2015-06-05 ENCOUNTER — Encounter (HOSPITAL_BASED_OUTPATIENT_CLINIC_OR_DEPARTMENT_OTHER): Payer: Self-pay | Admitting: Obstetrics/Gynecology

## 2015-06-05 ENCOUNTER — Encounter (HOSPITAL_BASED_OUTPATIENT_CLINIC_OR_DEPARTMENT_OTHER): Payer: No Typology Code available for payment source | Admitting: Obstetrics/Gynecology

## 2015-06-05 LAB — URINE C/S: Culture: 5000

## 2015-06-05 NOTE — Progress Notes (Signed)
I saw and evaluated the patient. I have reviewed the resident's documentation and agree with it.  Pt with no findings on exam today, so yeast culture sent again.  Pt with low level colonization that I treated due to symptoms, but I have messaged Dr. Craige CottaKirby to get her opinion re continuing to treat low level colonization.  U culture sent today also.    Pt not able to be compliant with Metformin.  Had Nexplanon and IUD in only ~2 weeks each in the early winter 2016, so reluctant to consider a LARC at this time if willing to take OCP's.  Also, has h/o irregular menses, so OCP may hold advantages to LARC for regulation of bleeding.    No evidence BV today.  Vestibulitis markedly improved without introital tenderness on exam today

## 2015-06-06 LAB — R/O YEAST CULT W/DIRECT EXAM: Stain For Fungus: NONE SEEN

## 2015-06-11 ENCOUNTER — Encounter (HOSPITAL_BASED_OUTPATIENT_CLINIC_OR_DEPARTMENT_OTHER): Payer: Self-pay | Admitting: Obstetrics/Gynecology

## 2015-06-11 DIAGNOSIS — M79641 Pain in right hand: Secondary | ICD-10-CM | POA: Insufficient documentation

## 2015-06-11 NOTE — Progress Notes (Signed)
**Note Kristen Salinas-Identified via Obfuscation** Occupational Therapy Hand Clinic Note: [facility], [facility] [place]     Time In: 10:30 am     Duration: 30 minutes   Date of onset: 05/07/2015    The following patient identifiers were confirmed with the patient: name and date of birth.   Referring M.D: Cristela Felt, M.D.  NPI: [id number]    Diagnosis: Right open carpal tunnel release 05/07/2015  Treatment/Therapy Diagnosis: Hand pain.     History: Patient is a 24 year old right hand dominant female who reports bilateral carpal tunnel syndrome on/off since she was 78, and worsened this past year as a result of RSI. She underwent right open carpal tunnel release on 05/07/2015, and is planning to have surgery for the left side in the future. The patient is currently unemployed, but previously worked as a Theatre manager. She enjoys reading, using her phone, and spending time with her dog. 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 The patient lives in [facility] and has assistance for ADL and IADL performance.    Fall risk: not fallen in the past year.   The patient arrived on this date with orders for therapy to provide: Occupational therapy evaluation, Range of motion/Stretching, Edema control and Wound care   Barriers to learning: none   Preferred learning style: demonstration, written and verbal   Cultural Practices Influencing care: none     Level of function at start of care: The patient is 4 days post open carpal tunnel release and has medical precautions that preclude her from weightbearing, forceful grip and pinch until medically cleared. She has sutures that need to be kept relatively clean and dry, precluding her from tasks that required immersion in water, impacting her ability to bathe, cook and do dishes until they are removed.     Skilled Services/Interventions provided: Occupational Therapy Evaluation for 15 minutes and therapeutic exercises for 15 minutes   The patient was educated on anatomy of carpal tunnel, release procedure,  progression of healing, and precautions. With the right upper extremity elevated on bolster, post-op dressings were removed. Wounds were carefully inspected, cleansed and sterile dressings were applied. The patient was instructed in daily dressing changes, signs of infection, and supplies were issued. Patient was instructed in the use of heat and ice modalities for the self-management of symptoms and optimal performance of the home exercise program. She was instructed in edema management techniques and a size D tubigrip sleeve was placed over her dressing. Patient was instructed in a home exercise program including thumb and wrist range of motion, to be performed 4-6 times per day, for 5-10 repetitions or as tolerated. A written/illustrated hand out was issued.     Current Impairments:   Pain: The patient is reporting 3-7/10 level of pain, on a 0-10 Numeric Pain Distress Scale.     Range of Motion: Wrist extension 0-55 degrees on left, full digit motion, thumb opposition to ring finger but not to small finger. Thumb MP/IP 30/80 degrees.  Strength: Not taken.   Wound status: Incision clean and dry, healing well without signs of complications including redness, swelling, and exudate.   Sensation: Grossly intact to light touch at fingertips and no longer waking with paresthesia in the left per patient report.   Provocative tests: Not performed.     Patient's participation in home program:   Patient verbalizes/demonstrates comprehension of the exercise routine, progression and precautions.     GOALS:   Long term functional goal(s): (Hand) Open a jar lid **Note Kristen Salinas-Identified via Obfuscation** with involved hand within 12 weeks. and Chop for food prep within 12 weeks. and (Wrist) Lift full pot off the stove within 12 weeks.     These goals were discussed and the patient actively participated in developing them.     Rehab potential: Excellent     PLAN: Recheck in clinic with physician next week for suture removal, initiation of scar massage and nocturnal scar  pad, progression of ROM and strengthening if appropriate.     Frequency/Duration: This patient will be seen for 1 therapy session next week for then will perform her home program independently.     The therapy will include: Occupational Therapy Evaluation , Therapeutic exercises , Manual therapy techniques , Selfcare/ADL Management and Community Work Integration

## 2015-06-15 ENCOUNTER — Ambulatory Visit (INDEPENDENT_AMBULATORY_CARE_PROVIDER_SITE_OTHER): Payer: No Typology Code available for payment source | Admitting: Family Medicine

## 2015-06-15 ENCOUNTER — Encounter (INDEPENDENT_AMBULATORY_CARE_PROVIDER_SITE_OTHER): Payer: Self-pay | Admitting: Family Medicine

## 2015-06-15 VITALS — BP 127/77 | HR 72 | Temp 98.1°F | Resp 16 | Wt 245.0 lb

## 2015-06-15 DIAGNOSIS — H01004 Unspecified blepharitis left upper eyelid: Secondary | ICD-10-CM

## 2015-06-15 DIAGNOSIS — H1032 Unspecified acute conjunctivitis, left eye: Secondary | ICD-10-CM

## 2015-06-15 DIAGNOSIS — H578 Other specified disorders of eye and adnexa: Secondary | ICD-10-CM

## 2015-06-15 DIAGNOSIS — H5789 Other specified disorders of eye and adnexa: Secondary | ICD-10-CM

## 2015-06-15 MED ORDER — POLYMYXIN B-TRIMETHOPRIM 10000-0.1 UNIT/ML-% OP SOLN
1.0000 [drp] | OPHTHALMIC | Status: AC
Start: 2015-06-15 — End: 2015-06-20

## 2015-06-15 NOTE — Patient Instructions (Addendum)
Understanding Red Eye: Causes  Do your eyes sometimes get red and irritated? This could be a sign of irritation or infection. The inside of your eyelid and the white of your eye are covered with a membrane called the conjunctiva. When your eye is irritated or infected, the blood vessels in this membrane swell. This condition is called conjunctivitis. It is also known asred eye or pink eye.    Irritation  Allergies and environmental irritants are common causes of inflamed eyes. Other causes can include:   Injuries   Objects in the eye   Some eye drops   Makeup   Contact lenses   Diseases inside the eye  Infection  Viruses and bacteria can cause eye infections. An infection may begin inyour eye.It can alsostartsomewhere elsein your body, such as yournose or throat. Sexually transmitted diseases can also cause eye infections.   [address] [employer] [address], [place], [place] [address]. All rights reserved. This information is not intended as a substitute for professional medical care. Always follow your healthcare professional's instructions.        Blepharitis    Blepharitis is an inflammationof the eyelid. It results in swelling of the eyelids, and it is usually caused by a bacterial infection or a skin condition.Blepharitis is a common eye condition. There are two types. Anterior blepharitis occurs where the eyelashes are attached (outside front edge of the eye). Posterior blepharitis affects the inner edge of the eyelid that touches the eyeball.  In addition to swollen eyelids, symptoms of blepharitis can include thick, yellow, dandruff-like scalesthat stick to the eyelid. There may be oily patches on the eyelid. The eyelashes may be crusted (with dandruff-like scales) when you wake up from sleeping. The irritated area may itch. The eyelids may be red. The eyes can be red and burn or sting. The eyes may tear a lot, or be dry. You can become sensitive to light or have blurred  vision. Symptoms of blepharitis can cause irritability.  Blepharitis is a chronic condition and difficult to cure. Even with successful treatment, recurrences are common. Good hygiene and home treatments (in the Home care section below) canimprove your condition.  Causes  Causes of blepharitis may include:   Problems with theoil glands in the eyelid (meibomian glands)   Dandruff of the scalp and eyebrows (seborrheic dermatitis)   Acne rosacea (a skin condition that causes redness of the face, and other symptoms)   Eyelash mites (tiny organisms in the eyelash follicles)   Allergic reactions to cosmetics or medicines  Home care  Medicine: The healthcare provider may prescribe an antibiotic eye drops or ointment, artificial tears, and/or steroid eye drops. Follow all instructions for using these medicines. Use all medicines as directed. If you have pain, take medicine as advised by the healthcare provider.   Wash your hands carefully with soap and warm water before and after caring for your eyes.   Apply awarm compress or a warm, moist washcloth to the eyelids for 1 minute, 2 to 3 times a day, to loosen the crust. Then, wipe away scales or crust from the eyelids.   After applying the warm compress, gently scrub the base of the eyelashes for almost 15 seconds per eyelid. To do this, close your eyes and use a moist eyelid cleansing wipe, clean washcloth, or cotton swab. Ask your healthcare provider about products (such as nonirritating baby shampoo) to use to help clean the eyelids.   You may be instructed to gently  massage your eyelids to help unblock the eyelid glands. Follow all instructions given by the healthcare provider.   Unless told otherwise, on a regular basis, with eyes closed, clean your eyelids as directed by the healthcare provider. Blepharitis can be an ongoing problem.   Do not wear eye makeup until the inflammation goes away, or as directed by your healthcare provider.   Unless told  otherwise, stop using contact lenses until you complete treatment for the condition.   Wash your hands regularly to help prevent dirt and bacteria from coming in contact with your eyelid.  Follow-up care  Follow up with your healthcare provider, or as advised. Your healthcare provider may refer you to an eye specialist (an optometrist or ophthalmologist) for further evaluation and treatment.  When to seek medical advice  Call your healthcare provider right away if any of these occur:   Increase in rednessof the white part of the eye   Increase in swelling, redness,irritation, or pain of the eyelids   Eye pain   Change invision (trouble seeing or blurring)   Drainage (pus, blood)from the eyelid   Fever of 100.54F (38C) or higher, or as directed by your healthcare provider   2000-2015 [employer] [facility]. [address], [place], [place] [id number]. All rights reserved. This information is not intended as a substitute for professional medical care. Always follow your healthcare professional's instructions.

## 2015-06-15 NOTE — Progress Notes (Signed)
Chief Complaint   Patient presents with   . Eye Problem     eye problem       HPI  Kristen Salinas is a 24 year old female who presents with complaint of 3 weeks of eye redness, itchiness, and irritation which has been gradually worsening. Denies vision changes, discharge, swelling, headache, nausea, dizziness, light sensitivity, and recent uri symptoms. Does not wear glasses or contacts. Denies feeling the sensation that something is in her eye. Denies chemical exposure. Has not been using any eye drops in the eye. No new makeup or facial products. Rarely wears mascara. Has never had this before.        Outpatient Prescriptions Prior to Visit   Medication Sig Dispense Refill   . Clobetasol Propionate 0.05 % External Ointment Use pea sized amount to vestibule nightly x 6 weeks 30 g 2   . DULoxetine HCl 60 MG Oral CAPSULE ENTERIC COATED PARTICLES Take 1 capsule (60 mg) by mouth daily. 30 capsule 2   . Fluconazole 150 MG Oral Tab Take one tablet mon/thurs/mon/thurs, then go to once a week on Mondays 12 tablet 1   . HydrOXYzine Pamoate 50 MG Oral Cap Take 1 capsule (50 mg) by mouth at bedtime. 90 capsule 0   . Ibuprofen 600 MG Oral Tab For pain/inflammation. Take with food. 100 tablet 2   . Levonorgestrel-Ethinyl Estrad 0.1-20 MG-MCG Oral Tab Take 1 tablet by mouth daily. 3 packet 3   . Magnesium Oxide 400 MG Oral Tab Take 2 tablets (800 mg) by mouth daily. 60 tablet 4   . MetFORMIN HCl 500 MG Oral Tab Take 1/2 pill a day for 8 days (4 pills) then one pill a day x 1 month, then one pill twice a day 80 tablet 3   . MetroNIDAZOLE 0.75 % Vaginal Gel Place 1 applicatorful into the vagina 2 times a week. Use 2 x weekly for 4 weeks. 45 g 1   . Nitrofurantoin Monohyd Macro 100 MG Oral Cap Take 1 capsule (100 mg) by mouth every 12 hours. For infection. Take until gone. 10 capsule 0   . Omeprazole 20 MG Oral CAPSULE DELAYED RELEASE Take 1 capsule (20 mg) by mouth daily on an empty stomach. 30 capsule 1   . Tolnaftate 1  % External Powder Use to space between legs and vulva twice daily 90 g 2   . TraZODone HCl 100 MG Oral Tab Take 1 tablet (100 mg) by mouth at bedtime. for Insomnia. 90 tablet 3     No facility-administered medications prior to visit.       Review of patient's allergies indicates:  Allergies   Allergen Reactions   . Prednisone Rash and Hives       Past Medical History   Diagnosis Date   . Obesity    . Pap smear abnormality of cervix    . Heart burn    . Chest pain    . History of irritable bowel syndrome    . Frequent use of laxatives    . GERD (gastroesophageal reflux disease)    . Indigestion    . Nausea and vomiting    . Vulvar vestibulitis 11/26/2014   . Disorder of menstrual bleeding    . Gastric ulcer    . Depression    . Anxiety    . Headache    . Heart murmur        Social History   Substance Use Topics   . Smoking  status: Former Smoker -- 0.25 packs/day for 2 years     Types: Cigarettes   . Smokeless tobacco: Never Used   . Alcohol Use: Yes      Comment: Rare         ROS  Review of Systems   Constitutional: Negative for fever and chills.   HENT: Negative for congestion, facial swelling, postnasal drip, rhinorrhea, sinus pressure, sneezing, sore throat and tinnitus.    Eyes: Positive for discharge, redness and itching. Negative for photophobia, pain and visual disturbance.   Respiratory: Negative for cough.    Gastrointestinal: Negative for nausea.   Skin: Negative for rash and wound.   Allergic/Immunologic: Negative for environmental allergies.   Neurological: Negative for dizziness, light-headedness and headaches.   Psychiatric/Behavioral: Negative for sleep disturbance.         PHYSICAL EXAM  BP 127/77 mmHg  Pulse 72  Temp(Src) 98.1 F (36.7 C) (Oral)  Resp 16  Wt 245 lb (111.131 kg)  SpO2 99%  LMP 05/20/2015    Physical Exam   Constitutional: She appears well-developed and well-nourished. She is cooperative. She does not appear ill. No distress.   HENT:   Head: Normocephalic. Head is without  raccoon's eyes, without Battle's sign, without abrasion, without contusion, without right periorbital erythema and without left periorbital erythema.   Nose: Nose normal. Right sinus exhibits no maxillary sinus tenderness and no frontal sinus tenderness. Left sinus exhibits no maxillary sinus tenderness and no frontal sinus tenderness.   Mouth/Throat: Uvula is midline, oropharynx is clear and moist and mucous membranes are normal.   Eyes: EOM are normal. Pupils are equal, round, and reactive to light. Right eye exhibits no discharge and no exudate. Left eye exhibits no discharge, no exudate and no hordeolum. Right conjunctiva is not injected. Left conjunctiva is injected. Left conjunctiva has no hemorrhage.       Mildly inflamed hair follicles of the upper eyelashes with a small amount of visible flaky build up   Neurological: She is alert.   Skin: Skin is warm and dry. No rash noted. She is not diaphoretic. No erythema.   Psychiatric: She has a normal mood and affect. Her speech is normal and behavior is normal.   Nursing note and vitals reviewed.          ASSESSMENT & PLAN    (H10.32) Acute conjunctivitis of left eye, unspecified acute conjunctivitis type  (primary encounter diagnosis)  Plan: Polymyxin B-Trimethoprim 10000-0.1 UNIT/ML-%         Ophthalmic Solution    (H01.004) Blepharitis of left upper eyelid, unspecified type    (H57.8) Redness of left eye      Advised on conservative measures including keeping her hands clean and away from her eyes, gentle washing of her lids/lashes daily with baby shampoo or mild soap. Reviewed signs and symptoms that would indicate need for further evaluation including those symptoms that may be emergent.

## 2015-06-16 ENCOUNTER — Encounter (INDEPENDENT_AMBULATORY_CARE_PROVIDER_SITE_OTHER): Payer: No Typology Code available for payment source | Admitting: Family

## 2015-06-24 ENCOUNTER — Ambulatory Visit
Admission: RE | Admit: 2015-06-24 | Discharge: 2015-06-24 | Disposition: A | Discharge status: Home / Self Care | Payer: No Typology Code available for payment source | Attending: Obstetrics/Gynecology | Admitting: Obstetrics/Gynecology

## 2015-06-24 ENCOUNTER — Ambulatory Visit (HOSPITAL_BASED_OUTPATIENT_CLINIC_OR_DEPARTMENT_OTHER): Payer: No Typology Code available for payment source | Admitting: Obstetrics/Gynecology

## 2015-06-24 DIAGNOSIS — R300 Dysuria: Secondary | ICD-10-CM | POA: Insufficient documentation

## 2015-06-24 DIAGNOSIS — N9089 Other specified noninflammatory disorders of vulva and perineum: Secondary | ICD-10-CM | POA: Insufficient documentation

## 2015-06-24 DIAGNOSIS — R102 Pelvic and perineal pain: Secondary | ICD-10-CM

## 2015-06-24 DIAGNOSIS — F17210 Nicotine dependence, cigarettes, uncomplicated: Secondary | ICD-10-CM | POA: Insufficient documentation

## 2015-06-24 DIAGNOSIS — F408 Other phobic anxiety disorders: Secondary | ICD-10-CM | POA: Insufficient documentation

## 2015-06-24 DIAGNOSIS — Z6841 Body Mass Index (BMI) 40.0 and over, adult: Secondary | ICD-10-CM | POA: Insufficient documentation

## 2015-06-24 DIAGNOSIS — N390 Urinary tract infection, site not specified: Secondary | ICD-10-CM | POA: Insufficient documentation

## 2015-06-24 DIAGNOSIS — F4310 Post-traumatic stress disorder, unspecified: Secondary | ICD-10-CM | POA: Insufficient documentation

## 2015-06-24 DIAGNOSIS — F329 Major depressive disorder, single episode, unspecified: Secondary | ICD-10-CM | POA: Insufficient documentation

## 2015-06-24 DIAGNOSIS — M62838 Other muscle spasm: Secondary | ICD-10-CM | POA: Insufficient documentation

## 2015-06-24 DIAGNOSIS — N9489 Other specified conditions associated with female genital organs and menstrual cycle: Secondary | ICD-10-CM

## 2015-06-24 DIAGNOSIS — R339 Retention of urine, unspecified: Secondary | ICD-10-CM | POA: Insufficient documentation

## 2015-06-24 DIAGNOSIS — R011 Cardiac murmur, unspecified: Secondary | ICD-10-CM | POA: Insufficient documentation

## 2015-06-24 DIAGNOSIS — K219 Gastro-esophageal reflux disease without esophagitis: Secondary | ICD-10-CM | POA: Insufficient documentation

## 2015-06-27 ENCOUNTER — Encounter (INDEPENDENT_AMBULATORY_CARE_PROVIDER_SITE_OTHER): Payer: No Typology Code available for payment source | Admitting: Family Medicine

## 2015-06-29 ENCOUNTER — Ambulatory Visit (HOSPITAL_BASED_OUTPATIENT_CLINIC_OR_DEPARTMENT_OTHER)
Payer: No Typology Code available for payment source | Attending: Obstetrics/Gynecology | Admitting: Obstetrics/Gynecology

## 2015-06-29 VITALS — BP 132/79 | HR 85 | Temp 98.6°F | Ht 62.0 in | Wt 245.0 lb

## 2015-06-29 DIAGNOSIS — N939 Abnormal uterine and vaginal bleeding, unspecified: Secondary | ICD-10-CM | POA: Insufficient documentation

## 2015-06-29 DIAGNOSIS — N761 Subacute and chronic vaginitis: Secondary | ICD-10-CM | POA: Insufficient documentation

## 2015-06-29 DIAGNOSIS — R102 Pelvic and perineal pain: Secondary | ICD-10-CM | POA: Insufficient documentation

## 2015-06-29 DIAGNOSIS — N94810 Vulvar vestibulitis: Secondary | ICD-10-CM | POA: Insufficient documentation

## 2015-06-29 LAB — PR WET PREP/KOH/TEST, ONSITE

## 2015-06-29 LAB — PR ALL POTASSIUM HYDROXIDE PREPARATIONS

## 2015-06-29 MED ORDER — NORGESTREL-ETHINYL ESTRADIOL 0.3-30 MG-MCG OR TABS
1.0000 | ORAL_TABLET | Freq: Every day | ORAL | Status: DC
Start: 2015-06-29 — End: 2015-08-04

## 2015-06-29 NOTE — Progress Notes (Signed)
BP 132/79 mmHg  Pulse 85  Temp(Src) 98.6 F (37 C) (Temporal)  Ht 5\' 2"  (1.575 m)  Wt 245 lb (111.131 kg)  BMI 44.80 kg/m2  LMP 06/22/2015  This note was dictated by Neale BurlyEckert, Cheila Wickstrom F, MD.

## 2015-06-30 ENCOUNTER — Encounter (INDEPENDENT_AMBULATORY_CARE_PROVIDER_SITE_OTHER): Payer: Self-pay | Admitting: Obstetrics/Gynecology

## 2015-06-30 NOTE — Progress Notes (Signed)
Kristen Salinas, Kristen Salinas          [id number]          06/29/2015      [facility]       IDENTIFICATION/CHIEF COMPLAINT     This is a 24 year old female followed here for irregular bleeding and vaginitis symptoms, as well as vestibulitis.    HISTORY OF PRESENT ILLNESS     The patient just had a recent trigger point injection for the vestibulitis and pelvic floor pain from Dr. Craige Cotta.  She presents today wanting to discuss her abnormal bleeding.  The patient has been on  Levo-norgestrel containing oral contraceptives with 20 mcg ethinyl estradiol and she reports she has had spotting for the last 3 months.  She started them in July and she has never had a month where she bled only during the placebo week.  In the last 3 months, she states she starts bleeding after the eighth pill of the package and continues to have bleeding entirely until she starts the next package of pill. Bleeding is light- red to brown color.  No clots.  No cramps  She is wondering if we should try a different type of pill.  She is also open to trying a Nexplanon.  The patient had a Nexplanon before and she felt like it caused a lot of cramping, so had it removed.  She has also had Depo-Provera before, which lead to abnormal bleeding, and had an IUD briefly.    The patient feels comfortable with the oral contraceptives for contraception if the bleeding would be resolved and wants to think about the Nexplanon if the bleeding cannot be changed-though I discussed that 11% of time Nexplanon is removed for abnormal bleeding.  Last one was removed after a short time for cramping- pt states she had BV at the time, and BV causes cramping of her uterus, so feels like Nexplanon trial a second time may be warranted.    The patient has been on metformin briefly and decided not to continue that currently.  She is not on any medication for bacterial vaginosis or yeast right now and is currently not having any symptoms.    She reports that she and her  boyfriend are living in a motor home and are planning to travel to [place] for the month of January, as he has work down there, but then hope to come back to [place], however, this is making it hard for her to feel settled and to get work.  She has not had any counseling since August, but feels like if she needed it because of mood stability issues, she would be able to access it.    PHYSICAL EXAMINATION     VITAL SIGNS:  Blood pressure is 132/79.  Her pulse is 85.  Her weight is 245 with a BMI of 44.  This is a stable weight for her.  PELVIC:  On directed pelvic exam, she has no erythema around her external genitalia.  Her vestibule is very mildly tender without erythema.  On placing the speculum, she has pink vaginal mucosa with no increased erythema or flocculent discharge.  Her pH is 4.0.    Under wet mount, the patient has normal epithelial cells and some lactobacilli.  She has no hyphae seen or clue cells, pH was 4.0.  A yeast culture was also sent.    ASSESSMENT     1. Irregular bleeding on oral contraceptives.  Will change to a different formulation.  It  will go to 0.3 mcg of ethinyl estradiol and changed to a norgestimate progesterone formulation.  I encouraged her that it could take up to 2-3 months to know if this is going to regulate her periods.  2. Would consider Nexplanon potentially in the future, but I am wary of this as the abnormal bleeding with Nexplanon can also be a challenge.  3. No evidence of any vaginitis currently.  4. The patient states emotionally she feels safe and stable currently, although is not looking forward to potentially needing to be located in [place].    PLAN     The patient will return to clinic at the end of February.  She will keep track of a bleeding calendar until then.

## 2015-07-01 ENCOUNTER — Encounter (HOSPITAL_BASED_OUTPATIENT_CLINIC_OR_DEPARTMENT_OTHER): Payer: Self-pay | Admitting: Obstetrics/Gynecology

## 2015-07-01 ENCOUNTER — Telehealth (HOSPITAL_BASED_OUTPATIENT_CLINIC_OR_DEPARTMENT_OTHER): Payer: Self-pay | Admitting: Obstetrics/Gynecology

## 2015-07-01 NOTE — Telephone Encounter (Signed)
I do not think the pain she had after a bowel movement is related to the trigger point injections.  I recommend she avoid narcotics and take miralax to have soft stools and avoid constipation.

## 2015-07-01 NOTE — Telephone Encounter (Signed)
Situation:  Contacted patient to followup on call regarding pain following trigger point injections.  Background:  S/P 1 week trigger point injections of levator ani muscles and vestibule and ligation of skin tag, DOS: 06/24/15.  Assessment:  Received message that patient had contacted Katheren PullerMarie Porter surgical scheduler regarding discomfort and pressure following trigger point injections on 06/24/15. Patient reported to Hilda LiasMarie that her pain prior to having a bowel movement was at a 2/10 and following a BM it was 6/10. Patient also reported that she had the sense of urgency with the BM due to the sensation of pressure.  Recommendation:  Left patient a message to call the clinic.

## 2015-07-01 NOTE — Telephone Encounter (Signed)
Pt returning call to [place].

## 2015-07-01 NOTE — Telephone Encounter (Addendum)
Patient states that the pain started following the first BM after the trigger point injections. Pain has been 7 out of 10.  Patient states that it was so severe she felt as though she could not sit down. Patient denies any other symptoms, no bleeding, fever, or chills.  Patient states that she has been taking Ibuprofen 600mg  once a day for the pain. Patient states that she had to take two Vicodin tablets that she had left over from a previous surgery for the pain last night because the Ibuprofen was not helping. Patient states that the pain and pressure is somewhat better now, she is able to sit comfortably.   Patient wants to know if this type of pain/pressure in the rectal area is to be expected after the trigger point injection procedure. Patient is scheduled for a followup visit with Dr. Craige CottaKirby on 07/20/15 but wants to know if she should move this up to 12/12 when she will be in the area.

## 2015-07-01 NOTE — Telephone Encounter (Signed)
Gave patient Dr. Evlyn CourierKirby's message from below. Patient stated that the stool was quite hard. Patient will avoid narcotics and take miralax as recommended. Patient has moved up the followup visit with Dr. Craige CottaKirby to 07/06/15.  Patient wants to know if she needs to cancel pelvic floor PT this coming Friday due to possibly aggravating that area further.

## 2015-07-01 NOTE — Telephone Encounter (Signed)
Gave patient Dr. Kirby's response from below. Patient stated that she will do as instructed.  Patient did not have any further questions at this time.

## 2015-07-01 NOTE — Telephone Encounter (Signed)
If her pain resolves with a bowel regimen, she can proceed with PT on Friday.  If she still has pain, she should cancel until she sees me.

## 2015-07-01 NOTE — Telephone Encounter (Signed)
I was able to schdl Pt with Kristen Salinas for Monday, 12/5. She asked to send a msg to see if she is OK to go to PT this week, or if she should hold off. Please call to follow up. Thank you.

## 2015-07-02 LAB — R/O YEAST CULT W/DIRECT EXAM: Stain For Fungus: NONE SEEN

## 2015-07-02 NOTE — Telephone Encounter (Signed)
Routed to Dr. Craige CottaKirby & Abbi PSS. Attempted to call patient to see how she is doing but no answer. Left detailed message as seen in e-mail message.  Advised patient to call and schedule appt ASAP and if pain unbearable or of concern, contact PCP or go to Urgent Care.    Dahlia Byesobin Alverda Nazzaro RN  [facility]

## 2015-07-03 ENCOUNTER — Encounter (HOSPITAL_BASED_OUTPATIENT_CLINIC_OR_DEPARTMENT_OTHER): Payer: No Typology Code available for payment source

## 2015-07-06 ENCOUNTER — Encounter (INDEPENDENT_AMBULATORY_CARE_PROVIDER_SITE_OTHER): Payer: No Typology Code available for payment source | Admitting: Obstetrics/Gynecology

## 2015-07-06 ENCOUNTER — Telehealth (INDEPENDENT_AMBULATORY_CARE_PROVIDER_SITE_OTHER): Payer: Self-pay | Admitting: Obstetrics/Gynecology

## 2015-07-06 NOTE — Telephone Encounter (Signed)
(  TEXTING IS AN OPTION FOR [facility] ONLY)  Is this a [facility] clinic? No      RETURN CALL: No call back needed      SUBJECT:  Cancellation/Reschedule Request     ORIGINAL APPOINTMENT DATE: 07/06/2015, TIME: 3:40pm  REASON: Per Pt stated that the reason for her coming in has resolved   REQUESTED DATE: n/a, TIME: n/a per she sated she will just follow up on 12/19  UNABLE TO APPOINT BECAUSE: unable cancel per SOP Same day appointment section

## 2015-07-07 ENCOUNTER — Ambulatory Visit
Payer: No Typology Code available for payment source | Attending: Obstetrics/Gynecology | Admitting: Obstetrics/Gynecology

## 2015-07-07 VITALS — BP 119/78 | HR 83 | Ht 62.0 in | Wt 239.0 lb

## 2015-07-07 DIAGNOSIS — R102 Pelvic and perineal pain: Secondary | ICD-10-CM | POA: Insufficient documentation

## 2015-07-07 DIAGNOSIS — M62838 Other muscle spasm: Secondary | ICD-10-CM | POA: Insufficient documentation

## 2015-07-07 DIAGNOSIS — N939 Abnormal uterine and vaginal bleeding, unspecified: Secondary | ICD-10-CM

## 2015-07-07 DIAGNOSIS — K5900 Constipation, unspecified: Secondary | ICD-10-CM | POA: Insufficient documentation

## 2015-07-08 NOTE — Progress Notes (Signed)
[facility]  Chief Complaint   Patient presents with   . Post-Op Follow Up       History:  24 year old female presents today for follow up for pain after hard bowel movement several days after trigger point injection.  The pain resolved spontaneously, and now she has some vaginal discharge and vulvar burning.  Thinks she overall has less pain than prior to the trigger point injection, which was her second injection.  Has questions about vaginal spotting for last two months as well.  Takes birth control pills daily, including placebo, and bleeds placebo week through first half of first week of active pills, then stops for a week, then bleeds again.      Exam:    Vitals: BP 119/78 mmHg  Pulse 83  Ht 5\' 2"  (1.575 m)  Wt 239 lb (108.41 kg)  BMI 43.70 kg/m2  LMP 06/22/2015  Neuro/psych:  Alert and oriented x 3 in a pleasant mood  Skin: Warm, dry and intact  Abdomen:  Soft, non tender, non distended, no hepatosplenomegaly or hernias.  Pelvic: No pelvic hematomas.  Still with levator tenderness and spasm.    Assessment:  Pain likely due to bowel movement.  No evidence of complication from injections such as a hematoma.  Since pain overall improved with trigger point injections, it is reasonable to have more injections with brief intervals in between them (up to 6).  Can also consider botox at some point for possible longer-lasting effect.  No evidence of vaginal infection.  Vaginal spotting likely due to low dose of estrogen in her pill (Dr. Dolphus JennyEckert has told her this as well).    Plan:   Schedule additional injections.  Follow up vaginal infections with Dr. Dolphus JennyEckert.  I recommend she change to the birth control pill with higher estrogen dose - prescribed by Dr. Dolphus JennyEckert.    I spent a total time of 15 minutes face-to-face with the patient, of which more than 50% was spent counseling as outlined in this note.

## 2015-07-08 NOTE — Patient Instructions (Signed)
Change birth control pill.    Schedule up to 4 more trigger point injections, or consider botox injections.

## 2015-07-10 ENCOUNTER — Encounter (HOSPITAL_BASED_OUTPATIENT_CLINIC_OR_DEPARTMENT_OTHER): Payer: No Typology Code available for payment source | Admitting: Rehabilitative and Restorative Service Providers"

## 2015-07-13 ENCOUNTER — Encounter (HOSPITAL_BASED_OUTPATIENT_CLINIC_OR_DEPARTMENT_OTHER): Payer: Self-pay

## 2015-07-13 ENCOUNTER — Encounter (HOSPITAL_BASED_OUTPATIENT_CLINIC_OR_DEPARTMENT_OTHER): Payer: No Typology Code available for payment source | Admitting: Obstetrics/Gynecology

## 2015-07-17 ENCOUNTER — Encounter (HOSPITAL_BASED_OUTPATIENT_CLINIC_OR_DEPARTMENT_OTHER): Payer: Self-pay | Admitting: Obstetrics/Gynecology

## 2015-07-17 ENCOUNTER — Ambulatory Visit (HOSPITAL_BASED_OUTPATIENT_CLINIC_OR_DEPARTMENT_OTHER)
Payer: No Typology Code available for payment source | Attending: Obstetrics/Gynecology | Admitting: Obstetrics/Gynecology

## 2015-07-17 VITALS — BP 133/90 | HR 103 | Temp 98.2°F | Ht 62.0 in | Wt 244.0 lb

## 2015-07-17 DIAGNOSIS — F320 Major depressive disorder, single episode, mild: Secondary | ICD-10-CM | POA: Insufficient documentation

## 2015-07-17 DIAGNOSIS — N761 Subacute and chronic vaginitis: Secondary | ICD-10-CM

## 2015-07-17 DIAGNOSIS — N939 Abnormal uterine and vaginal bleeding, unspecified: Secondary | ICD-10-CM

## 2015-07-17 LAB — PR WET MOUNTS INCL PREP VAGINAL CERV/SKIN SPECIMENS, ONSITE

## 2015-07-17 LAB — PR U/A NONAUTO DIPSTICK ONLY, ONSITE
Bilirubin (Indirect): NEGATIVE
Glucose, Urine: NEGATIVE mg/dL
Ketones, URN: NEGATIVE mg/dL
Leukocytes: NEGATIVE
Nitrite, URN: NEGATIVE
Protein: NEGATIVE mg/dL
Urobilinogen, URN: 0.2 E.U./dL (ref 0.2–1.0)
pH, URN (UWNC): 5 (ref 5.0–8.0)

## 2015-07-17 LAB — PR ALL POTASSIUM HYDROXIDE PREPARATIONS

## 2015-07-17 NOTE — Progress Notes (Signed)
BP 133/90 mmHg  Pulse 103  Temp(Src) 98.2 F (36.8 C) (Temporal)  Ht 5\' 2"  (1.575 m)  Wt 244 lb (110.678 kg)  BMI 44.62 kg/m2  LMP 07/07/2015  Breastfeeding? No  This note was dictated by Neale BurlyEckert, Nadeem Romanoski F, MD.

## 2015-07-17 NOTE — Patient Instructions (Signed)
Head space app for phone for mindful meditation    Can get serum beta hcg on dec 26th.

## 2015-07-19 ENCOUNTER — Encounter (HOSPITAL_BASED_OUTPATIENT_CLINIC_OR_DEPARTMENT_OTHER): Payer: Self-pay | Admitting: Obstetrics/Gynecology

## 2015-07-19 LAB — URINE C/S: Culture: 50000

## 2015-07-20 ENCOUNTER — Encounter (HOSPITAL_BASED_OUTPATIENT_CLINIC_OR_DEPARTMENT_OTHER): Payer: Self-pay

## 2015-07-20 ENCOUNTER — Encounter (INDEPENDENT_AMBULATORY_CARE_PROVIDER_SITE_OTHER): Payer: No Typology Code available for payment source | Admitting: Obstetrics/Gynecology

## 2015-07-20 LAB — R/O YEAST CULT W/DIRECT EXAM: Stain For Fungus: NONE SEEN

## 2015-07-20 NOTE — Progress Notes (Signed)
Kristen Salinas, Kristen Salinas          [id number]          07/17/2015      WOMEN'S GYN CLINIC NOTE       HISTORY     This is a 24 year old female well-known to me for being followed for complaints consistent with chronic vaginitis and frequent UTIs.  She has also been seen for vestibulitis and is getting pelvic floor injections for levator spasm with Dr. Craige Cotta.    The patient also has a deep fear of becoming pregnant and when I walked into the room, she burst into tears saying that she is worried she was pregnant.  She has been taking a birth control pill daily for 4 months; however, she had intercourse on 07/06/2015 with no condom and typically has midcycle spotting with her period and has not had this midcycle spotting with the birth control pills and so she is worried she is pregnant, despite having taken the pill every day.  This is a common anxiety for the patient, she was worried that she was pregnant previously when she was using the IUD or also the Nexplanon.  The patient knows that she has an anxiety disorder, but is not under active treatment for this currently.  She is being treated for depression with Cymbalta.  Currently, since she had intercourse without a condom, she feels like her sleep is worse and that overall she is just not focusing and is not doing as well.  She is tearful for about 15 minutes talking about her fears of pregnancy.    We discussed other wellness techniques such as mindfulness and walking and discussed ways that she could try to help calm herself.  I offered an appointment in the clinic here with our mental health provider, Francisca December. ,She declined as she is currently living near [facility] and it is too long of a commute for her.  She would like to look for a therapist close to her. She has an appointment for a Nexplanon on 07/27/2015, as well as an appointment with her primary provider who has given her Cymbalta.     The patient is having some change in her vaginal discharge she  thinks and also some burning when she urinates.  No fever, chills, or sweats or lower abdominal pain.    PHYSICAL EXAMINATION     VITAL SIGNS:  Her blood pressure is 133/90, pulse is 103.  Her weight is 244, which is up a few pounds from 07/07/2015, but stable with 06/29/2015.  PELVIC:  On directed pelvic exam, her external genitalia is without evidence of inflammation.  She actually has no tenderness in the vestibule.  Placing the speculum, she has very minimal vaginal discharge and her vaginal walls are pink, but not hyperemic.  Specimen was collected for wet mount as well as yeast culture, and also a clean catch urine was collected.      On a clean catch, her dip was completely negative, but a culture was sent.  Wet mount: normal epi's, no hyphae, pH=4.0. Scant wbc's. No parabasal cells    ASSESSMENT     1. No evidence of vaginitis or UTI on exam, but culture sent.  2. Severe anxiety exacerbation with worry of pregnancy.    PLAN     1. I offered reassurance the best I could about pregnancy, given that she has been on a daily birth control pill.  2. Already has plan to get a Nexplanon in  place on 07/27/2015, and will get a urine pregnancy tests associated with that.  The patient is requesting a serum pregnancy test, as she knows that is slightly more sensitive.  I ordered this and she will stop by [facility] to get a serum hCG en route to the appointment for her Nexplanon.  3. Acute anxiety.  The patient has trazodone.  I encouraged her to take this and I also gave her name of free phone app: head space;  for mindfulness and discussed mindfulness techniques.  She declined [facility] due to the long [facility].  4. Multiple physicians.  The patient sees Dr. Craige CottaKirby, she sees her primary care physician, she sees myself.  I encouraged the patient to try to consolidate her care.  5. She will stay on oral contraceptives until she gets the Nexplanon in  place.    TIME STATEMENT     Total of 30 minutes in face-to-face time with this patient, greater than 50% in counseling.

## 2015-07-22 ENCOUNTER — Telehealth (INDEPENDENT_AMBULATORY_CARE_PROVIDER_SITE_OTHER): Payer: Self-pay | Admitting: Physician Assistant

## 2015-07-22 DIAGNOSIS — F5101 Primary insomnia: Secondary | ICD-10-CM

## 2015-07-22 DIAGNOSIS — F419 Anxiety disorder, unspecified: Secondary | ICD-10-CM

## 2015-07-22 NOTE — Telephone Encounter (Signed)
(  TEXTING IS AN OPTION FOR [facility] ONLY)  Is this a [facility] clinic? Yes. What is the mobile number we can use to get a hold of you via text? [phone]      RETURN CALL: Detailed message on voicemail only      SUBJECT:  Refill Request - Urgent per caller    MEDICATION(S): Hydroxyzine  NEEDED BY: Urgent per caller - she has requested this 2x from her pharmacy and she has been out of this medication for the past week  PRESCRIBING PROVIDER: Sharyn CreamerMichelle Vierra  PHARMACY NAME AND LOCATIOBrigitte Pulse: [facility] [phone] [address] [place]  [id number]   PHARMACY PHONE: [phone]  PHARMACY FAX NUMBER: [phone]   ADDITIONAL INFORMATION: Please call caller back to advise if there is a problem with this request. Thank you.

## 2015-07-22 NOTE — Telephone Encounter (Signed)
Medication Refill Documentation  Name of Medication HydrOXYzine Pamoate 50 MG Oral Cap  Prescribing Provider Sharyn CreamerMichelle Vierra, PA-C  Date last filled 04/15/2015  Date last seen for this issue 03/25/2015  Pharmacy loaded:yes

## 2015-07-23 MED ORDER — HYDROXYZINE PAMOATE 50 MG OR CAPS
50.0000 mg | ORAL_CAPSULE | Freq: Every evening | ORAL | Status: DC
Start: 2015-07-23 — End: 2015-08-04

## 2015-07-23 NOTE — Telephone Encounter (Signed)
Left Message to Call back.  Please relay Dr. Ewell PoeAnderson's message below

## 2015-07-23 NOTE — Telephone Encounter (Signed)
Ok for refill, faxed    Please advise pt to make a follow up visit prior to next refill needed

## 2015-07-23 NOTE — Telephone Encounter (Signed)
Received another refill request.  Routing to Dr Dareen PianoAnderson as Lorain ChildesFYI (Covering Provider)

## 2015-07-24 NOTE — Telephone Encounter (Signed)
Called left message for patient to return call.    CCR: When patient calls back please inform one refill of Hydroxyzine was faxed to pharmacy and patient is due for an office visit for any additional refills.  Please assist with scheduling.

## 2015-07-24 NOTE — Telephone Encounter (Signed)
Routing to the front desk to assist with scheduling.

## 2015-07-26 ENCOUNTER — Encounter (HOSPITAL_BASED_OUTPATIENT_CLINIC_OR_DEPARTMENT_OTHER): Payer: Self-pay | Admitting: Obstetrics/Gynecology

## 2015-07-27 ENCOUNTER — Ambulatory Visit: Payer: Self-pay

## 2015-07-27 ENCOUNTER — Encounter (INDEPENDENT_AMBULATORY_CARE_PROVIDER_SITE_OTHER): Payer: Self-pay | Admitting: Family Medicine

## 2015-07-27 ENCOUNTER — Other Ambulatory Visit (INDEPENDENT_AMBULATORY_CARE_PROVIDER_SITE_OTHER): Payer: Self-pay | Admitting: Physician Assistant

## 2015-07-27 DIAGNOSIS — F419 Anxiety disorder, unspecified: Secondary | ICD-10-CM

## 2015-07-27 NOTE — Telephone Encounter (Signed)
Protocol: NO CONTACT OR DUPLICATE CONTACT CALL-ADULT-AH  Affirmative: Message left on identified answering machine.  Disposition of No Contact Calls  suggested.

## 2015-07-27 NOTE — Telephone Encounter (Signed)
-----   Message from Myrtie Somaneanna M Nelson sent at 07/27/2015 11:18 AM PST -----  Regarding: [facility] call from sound tele/ need refill   >> Myrtie SomanDEANNA M NELSON 07/27/2015 11:18 AM  [place] pt- call from sound tele/ need refill

## 2015-07-28 ENCOUNTER — Ambulatory Visit (HOSPITAL_BASED_OUTPATIENT_CLINIC_OR_DEPARTMENT_OTHER)
Admit: 2015-07-28 | Discharge: 2015-07-28 | Disposition: A | Discharge status: Home / Self Care | Payer: No Typology Code available for payment source | Attending: Obstetrics/Gynecology | Admitting: Obstetrics/Gynecology

## 2015-07-28 DIAGNOSIS — N939 Abnormal uterine and vaginal bleeding, unspecified: Secondary | ICD-10-CM | POA: Insufficient documentation

## 2015-07-28 LAB — PREGNANCY (HCG), SERUM, QUANT: Pregnancy (HCG), SRM: <1 m[IU]/mL (ref <6)

## 2015-07-28 MED ORDER — DULOXETINE HCL 60 MG OR CPEP
60.0000 mg | DELAYED_RELEASE_CAPSULE | Freq: Every day | ORAL | Status: DC
Start: 2015-07-28 — End: 2015-08-04

## 2015-07-28 NOTE — Telephone Encounter (Signed)
faxed

## 2015-07-28 NOTE — Telephone Encounter (Signed)
Medication Refill Documentation  Name of Medication Cymbalta   Prescribing Provider Glenetta BorgPatricia Read-Williams MD  Date last filled 03/25/2015  Date last seen for this issue 12/25/2014  Pharmacy loaded: YES

## 2015-07-28 NOTE — Telephone Encounter (Signed)
3rd Attempt.  eCare message sent to patient.  Please close TE as needed.

## 2015-07-28 NOTE — Telephone Encounter (Signed)
Medication already refilled.

## 2015-07-28 NOTE — Telephone Encounter (Signed)
Patient called in to request an appointment for her refill request. Patient reports she has been trying schedule an appointment with Micelle but she keeps getting cancelled due to her medical leave. Patient wanted Dr. Eulas Postead Williams to know how upset she is that she keeps getting rescheduled. Patient was put on Dr. Ewell PoeAnderson's schedule for 08/04/2015 at 3:00 PM.    Routing to Dr. Eulas Postead Williams.

## 2015-07-28 NOTE — Telephone Encounter (Signed)
Patient is out of medication and can only be out for one day to avoid withdrawal symptoms. Please advise.

## 2015-08-04 ENCOUNTER — Encounter (INDEPENDENT_AMBULATORY_CARE_PROVIDER_SITE_OTHER): Payer: Self-pay | Admitting: Family Medicine

## 2015-08-04 ENCOUNTER — Ambulatory Visit (INDEPENDENT_AMBULATORY_CARE_PROVIDER_SITE_OTHER): Payer: No Typology Code available for payment source | Admitting: Family Medicine

## 2015-08-04 VITALS — BP 125/85 | HR 87 | Temp 100.4°F | Ht 62.0 in | Wt 246.0 lb

## 2015-08-04 DIAGNOSIS — F419 Anxiety disorder, unspecified: Secondary | ICD-10-CM

## 2015-08-04 DIAGNOSIS — F320 Major depressive disorder, single episode, mild: Secondary | ICD-10-CM

## 2015-08-04 DIAGNOSIS — F5101 Primary insomnia: Secondary | ICD-10-CM

## 2015-08-04 MED ORDER — BUSPIRONE HCL 5 MG OR TABS
5.0000 mg | ORAL_TABLET | Freq: Two times a day (BID) | ORAL | Status: DC
Start: 2015-08-04 — End: 2015-10-16

## 2015-08-04 MED ORDER — HYDROXYZINE HCL 25 MG OR TABS
25.0000 mg | ORAL_TABLET | ORAL | Status: DC | PRN
Start: 2015-08-04 — End: 2015-10-16

## 2015-08-04 MED ORDER — DULOXETINE HCL 40 MG OR CPEP
40.0000 mg | DELAYED_RELEASE_CAPSULE | Freq: Every day | ORAL | Status: DC
Start: 2015-08-04 — End: 2015-10-16

## 2015-08-04 MED ORDER — TRAZODONE HCL 50 MG OR TABS
50.0000 mg | ORAL_TABLET | Freq: Every evening | ORAL | Status: DC
Start: 2015-08-04 — End: 2015-10-16

## 2015-08-04 NOTE — Patient Instructions (Addendum)
We're Moving!!  As of January 2017 our new address will be [address] in [place].      It was a pleasure to see you in clinic today.                If you are not yet signed up for eCare, please see the below eCare section at the end of this document for how to enroll and to get your access codes.  It's easy to sign up at home today.    eCare  enrollment will allow you access to the below benefits   You can make appointments online   View test results / Lab Results   Request prescription renewals   Obtain a copy of our After Visit Summary (an electronic copy of this document)     Your Test Results:  If labs were ordered today the results are expected to be available via eCare in about 5 days. If you have an active eCare account, this is how we will notify you of your results.     If you do not have an eCare account then your test results will be mailed to you within about 14 days after your tests are completed. If your physician needs to change your care based on your results or is concerned, you should expect a phone call or eCare message from your provider.    If you have any questions about your test results please schedule an appointment with your provider, so that we may review them with you in greater detail.    **If it has been more than 2 weeks and you have not received your test results please send our office a message via eCare.    Medication Refills: If you need a prescription refilled, please contact your pharmacy 1 week before your current supply will run out to request the refill.  Contacting your pharmacy is the fastest and safest way to obtain a medication refill.  The pharmacy will notify our office.  Please note, that a minimum of 48 to 72 hours is needed to refill a medication,  Please call your pharmacy early to allow enough time to refill before you anticipate running out.  For faster medication refills, you can also schedule an appointment with your provider.    We know you have  a choice in where you receive your healthcare and we sincerely thank you for trusting [facility] with your health.      Trial of decreasing Cymbalta from 60 to 40 mg per day, then can try cutting back on Trazodone or Hydroxyzine due to sleepiness                         If increased anxiety, can add Buspar    Please send ecare message in 2 weeks with update on how you are doing

## 2015-08-04 NOTE — Progress Notes (Signed)
S: Pt  presents for follow up on sleep disturbance, anxiety and depression.    Symptoms have been stable but notes more irritability as oppsed to depressed or sad.  Does also feel anxious.  Preparing to travel to [place] for several months with boyfriend for his job.  Bothersome symptoms still include irritability.  Pt denies SI/HI, A/V hallucinations. The patient HAS been taking medications as noted.  Side effects include groggy in am, hard to wake up in am and tired throughout the day.  Feels like gets drowsy when driving.  Does have issues with snoring - scheduled for sleep study but then had to leave the state, now leaving again x 2-3 months. The patient HAS NOT been going to counseling.  Current Stressors:  Finances, feeling   stuck.  Moved from a house to RV,  But now has been living with parents for a short time while RV is repaired.    Over the last 2 weeks how often have you been bothered by any of the following problems?  1. Little interest or pleasure in doing things: Several days  2. Feeling down, depressed or hopeless: Several days  3. Trouble falling or staying asleep, or sleeping too much: Not at all  4. Feeling tired or having little energy : Several days  5. Poor appetite or overeating: (!) More than half the days  6. Feeling bad about yourself - or that you are a failure or have let yourself or your family down: Not at all  7. Trouble concentrating on things, such as reading the newspaper or watching television: (!) Nearly every day  8. Moving or speaking much more slowly than usual.  Or the opposite - fidgety or restless: Several days  9. Thoughts that you would be better off dead or of hurting yourself in some way: Not at all    Total  PHQ9 Total Score: 9    10. If you checked off any problems, how difficult have these problems made it for you to do your work, take care of things at home, or get along with other people?: Somewhat difficult      GAD7 Score, Total: 12  GAD7 Score, Difficulty: If  you checked any problems, how difficult have they made it for you to do your work, take care of things at home, or get along with other people?: Somewhat difficult      O: BP 125/85 mmHg  Pulse 87  Temp(Src) 100.4 F (38 C) (Temporal)  Ht 5\' 2"  (1.575 m)  Wt 246 lb (111.585 kg)  BMI 44.98 kg/m2  SpO2 96%  LMP 07/21/2015   Here with Boyfriend who is supportive   Affect normal affect, good eye contact.  neatly groomed, no agitation, normal speech.  Further exam deferred.    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    Suggest sleep study when your return      F/u in 3 months.    Time of this visit with counseling was 25 minutes.

## 2015-08-04 NOTE — Progress Notes (Signed)
Reviewed eCare status with Patient:  YES    HEALTH MAINTENANCE:  Has the patient had any of these since their last visit?    Cervical screening/PAP: Not Due     Mammo: Not Due    Colon Screen: Not Due    Diabetic Eye Exam: Not Due      Have you seen a specialist since your last visit: No        HM Due:   Health Maintenance   Topic Date Due   . Influenza Vaccine (1) 04/02/2015   . Pap Smear  08/30/2015   . Chlamydia Screen  09/18/2015   . Gonorrhea Screen  09/18/2015   . Tetanus Vaccine  12/26/2022   . HIV Screen  Completed   . HPV Vaccine  Completed           Future Appointments  Date Time Provider Department Center   08/04/2015 3:00 PM Cecelia ByarsAnderson, Sandra Jean, MD [facility]

## 2015-08-05 ENCOUNTER — Encounter (INDEPENDENT_AMBULATORY_CARE_PROVIDER_SITE_OTHER): Payer: No Typology Code available for payment source | Admitting: Physician Assistant

## 2015-08-05 ENCOUNTER — Telehealth (INDEPENDENT_AMBULATORY_CARE_PROVIDER_SITE_OTHER): Payer: Self-pay | Admitting: Physician Assistant

## 2015-08-05 NOTE — Telephone Encounter (Signed)
Prior Authorization for: Duloxetine   Dosage Amount: 40 MG Oral Capsules    [place] [facility]     [place] Phone #: [phone]    Requesting Pharmacy Name: [id number] [phone] [address] [place] [phone]     Insurance ID: [id number]    Case # (if applicable): n/a    Gave form to Doctor (Yes/No) Received via [employer] and submitted to [facility]     Dr. Asencion PartridgeSandra Anderson     Postponed to 08/07/15 for follow up

## 2015-08-05 NOTE — Telephone Encounter (Signed)
Patient checking on the status.    Patient made aware of prior authorization time frame and when we hear back from insurance we will contact her.

## 2015-08-06 NOTE — Telephone Encounter (Signed)
Per [employer]- Duloxetine approved  Pharmacy and patient notified

## 2015-08-10 ENCOUNTER — Encounter (INDEPENDENT_AMBULATORY_CARE_PROVIDER_SITE_OTHER): Payer: No Typology Code available for payment source | Admitting: Obstetrics/Gynecology

## 2015-08-10 ENCOUNTER — Ambulatory Visit (HOSPITAL_BASED_OUTPATIENT_CLINIC_OR_DEPARTMENT_OTHER)
Payer: No Typology Code available for payment source | Attending: Obstetrics/Gynecology | Admitting: Obstetrics/Gynecology

## 2015-08-10 VITALS — BP 136/87 | HR 105 | Temp 99.0°F | Ht 62.0 in | Wt 248.0 lb

## 2015-08-10 DIAGNOSIS — Z975 Presence of (intrauterine) contraceptive device: Secondary | ICD-10-CM | POA: Insufficient documentation

## 2015-08-10 DIAGNOSIS — R300 Dysuria: Secondary | ICD-10-CM | POA: Insufficient documentation

## 2015-08-10 DIAGNOSIS — N94810 Vulvar vestibulitis: Secondary | ICD-10-CM | POA: Insufficient documentation

## 2015-08-10 LAB — PR WET MOUNTS INCL PREP VAGINAL CERV/SKIN SPECIMENS, ONSITE

## 2015-08-10 LAB — PR ALL POTASSIUM HYDROXIDE PREPARATIONS

## 2015-08-10 LAB — PR U/A NONAUTO DIPSTICK ONLY, ONSITE
Bilirubin (Indirect): NEGATIVE
Glucose, Urine: NEGATIVE mg/dL
Ketones, URN: NEGATIVE mg/dL
Leukocytes: NEGATIVE
Nitrite, URN: NEGATIVE
Urobilinogen, URN: 0.2 E.U./dL (ref 0.2–1.0)
pH, URN (UWNC): 6 (ref 5.0–8.0)

## 2015-08-10 NOTE — Progress Notes (Signed)
BP 136/87 mmHg  Pulse 105  Temp(Src) 99 F (37.2 C) (Temporal)  Ht 5\' 2"  (1.575 m)  Wt 248 lb (112.492 kg)  BMI 45.35 kg/m2  LMP 07/20/2015  This note was dictated by Neale BurlyEckert, Jeane Cashatt F, MD.

## 2015-08-12 ENCOUNTER — Encounter (HOSPITAL_BASED_OUTPATIENT_CLINIC_OR_DEPARTMENT_OTHER): Payer: Self-pay | Admitting: Obstetrics/Gynecology

## 2015-08-12 LAB — R/O YEAST CULT W/DIRECT EXAM: Stain For Fungus: NONE SEEN

## 2015-08-12 LAB — URINE C/S: Culture: NO GROWTH

## 2015-08-12 NOTE — Progress Notes (Signed)
Kristen Salinas, DRAGOS          [id number]          08/10/2015      This is a 25 year old well-known to me, who presents today for concerns of increased dysuria and frequency as well as some vulvar and vaginal burning.    HISTORY OF PRESENT ILLNESS     The patient had a Nexplanon placed on 07/28/2015 without incident.  She has had no vaginal bleeding since mid December.  She had a negative pregnancy test the day of placement.  Since then, she has been able to have coitus with her husband without fear of pregnancy.  She states that a few days ago she started developing increasing dysuria as the urine is leaving her body as well as some lower abdominal discomfort and frequency.  She is concerned she might have a bladder infection again.  She denies any change in vaginal discharge.  She does state occasionally she has clumps of mucousy thick discharge and sometimes her discharge is also pasty and white.  She has no odor currently.    The patient has a history of vestibulitis and pelvic floor neuralgia and is getting injections by Dr. Craige Cotta for this.  She states the vestibulitis is doing okay.  She is tolerating intercourse and not using any topical steroids at this time.  She does have some burning on the external genitalia as well as inside her vagina, but it is not as strong as it has been in the past.    She is using no vaginal medications currently.    The patient also has a history of anxiety and recently sought care with her family medicine doctor for this.  She is under treatment for this.  She and her significant other plan to travel to [place] for work and are living in their RV at this time and so she is not in any counseling program for her anxiety.    DIRECTED PHYSICAL EXAMINATION     Her blood pressure today is 136/87, and her BMI is 45.  She has gained 4 pounds since December 16.  On directed pelvic exam, external genitalia have no erythema present.  There is slight erythema in the crural folds;  however, the patient states there is no itching there.  Her vulva is without any edema or fissures.  Her vestibule is without erythema.  Very mild Q-Tip tenderness to touch.  No tenderness in the labia minora or majora or periclitoral area.  Placing the speculum, the patient has a white creamy vaginal discharge with a pH of 4.  The vaginal walls are pink, without evidence of inflammation.  Rugae are present.  The cervix was not visualized as the speculum was too short.  Samples were collected for wet mount and yeast.    LABORATORY     Dip urine reveals some blood, but otherwise completely negative.    Wet mount:  The pH was normal.  She had normal epithelial cells with scant white blood cells and scant lactobacilli.  No hyphae were seen, but yeast culture was sent, vaginal as well as of the crural fold area.    ASSESSMENT     1. A 25 year old female with symptoms consistent with urinary tract infections and has had urinary tract infections in the past.  2. Current dip urine is negative.  3. Also has pelvic floor neuralgia and is under treatment for that.  4. Has Nexplanon in place for contraception.  5. History of  BV and yeast in the past, but no signs of either currently.    PLAN     1. We will await results of the urine culture before giving antibiotics as the dip urine is negative.  Offered the patient Pyridium, which she declined.  2. Yeast culture was sent.  3. Encouraged patient to continue to work on anxiety.  4. Will address her increasing weight at her next visit.  We did not have the opportunity to discuss this today.

## 2015-09-04 ENCOUNTER — Encounter (HOSPITAL_BASED_OUTPATIENT_CLINIC_OR_DEPARTMENT_OTHER): Payer: No Typology Code available for payment source | Admitting: Obstetrics/Gynecology

## 2015-09-09 ENCOUNTER — Encounter (HOSPITAL_BASED_OUTPATIENT_CLINIC_OR_DEPARTMENT_OTHER): Payer: Self-pay | Admitting: Obstetrics/Gynecology

## 2015-09-09 NOTE — Telephone Encounter (Signed)
Will forward eCare message to Katheren Puller to assist patient with scheduling appt in OR for trigger point injections. Sent patient eCare message to inform her of this.

## 2015-09-11 ENCOUNTER — Ambulatory Visit (HOSPITAL_BASED_OUTPATIENT_CLINIC_OR_DEPARTMENT_OTHER)
Payer: No Typology Code available for payment source | Attending: Obstetrics/Gynecology | Admitting: Obstetrics/Gynecology

## 2015-09-11 ENCOUNTER — Encounter (INDEPENDENT_AMBULATORY_CARE_PROVIDER_SITE_OTHER): Payer: Self-pay | Admitting: Internal Medicine

## 2015-09-11 ENCOUNTER — Encounter (HOSPITAL_BASED_OUTPATIENT_CLINIC_OR_DEPARTMENT_OTHER): Payer: Self-pay | Admitting: Obstetrics/Gynecology

## 2015-09-11 ENCOUNTER — Ambulatory Visit (INDEPENDENT_AMBULATORY_CARE_PROVIDER_SITE_OTHER): Payer: No Typology Code available for payment source | Admitting: Internal Medicine

## 2015-09-11 VITALS — BP 138/82 | HR 93 | Temp 98.9°F | Resp 16

## 2015-09-11 VITALS — BP 132/87 | HR 80 | Temp 98.2°F | Ht 62.0 in | Wt 248.0 lb

## 2015-09-11 DIAGNOSIS — R3911 Hesitancy of micturition: Secondary | ICD-10-CM | POA: Insufficient documentation

## 2015-09-11 DIAGNOSIS — N761 Subacute and chronic vaginitis: Secondary | ICD-10-CM | POA: Insufficient documentation

## 2015-09-11 DIAGNOSIS — J0100 Acute maxillary sinusitis, unspecified: Secondary | ICD-10-CM

## 2015-09-11 DIAGNOSIS — N94810 Vulvar vestibulitis: Secondary | ICD-10-CM | POA: Insufficient documentation

## 2015-09-11 LAB — PR ALL POTASSIUM HYDROXIDE PREPARATIONS

## 2015-09-11 LAB — PR WET MOUNTS INCL PREP VAGINAL CERV/SKIN SPECIMENS, ONSITE

## 2015-09-11 MED ORDER — AMOXICILLIN 500 MG OR CAPS
500.0000 mg | ORAL_CAPSULE | Freq: Three times a day (TID) | ORAL | Status: DC
Start: 2015-09-11 — End: 2015-10-16

## 2015-09-11 NOTE — Progress Notes (Signed)
CHIEF COMPLAINT:   Sinus pain    History of present illness:   Patient has sinus congestion for 4 days, sinus pain, sore throat, ear pain. Denies chest pain, shortness of breath, wheezing, cough, palpitation. Denies abdominal pain, nausea/vomitting, diarrhea, constipation.   Worsening facial pain when bending forward.     Review of systems:   Please see HPI for details. All other systems reviewed and are negative other than what's listed in HPI.      Past Medical History   Diagnosis Date   . Obesity    . Pap smear abnormality of cervix    . Heart burn    . Chest pain    . History of irritable bowel syndrome    . Frequent use of laxatives    . GERD (gastroesophageal reflux disease)    . Indigestion    . Nausea and vomiting    . Vulvar vestibulitis 11/26/2014     seeing Dr Will Bonnet in [place]   . Disorder of menstrual bleeding    . Gastric ulcer    . Depression    . Anxiety    . Headache    . Heart murmur       Review of patient's allergies indicates:  Allergies   Allergen Reactions   . Prednisone Rash and Hives     Current Outpatient Prescriptions   Medication Sig Dispense Refill   . BusPIRone HCl 5 MG Oral Tab Take 1 tablet (5 mg) by mouth 2 times a day. 60 tablet 3   . DULoxetine HCl 40 MG Oral CAPSULE ENTERIC COATED PARTICLES Take 1 capsule (40 mg) by mouth daily. 30 capsule 3   . Etonogestrel (NEXPLANON SC)      . HydrOXYzine HCl 25 MG Oral Tab Take 1-2 tablets (25-50 mg) by mouth every 4 hours as needed. 30 tablet 2   . Ibuprofen 600 MG Oral Tab For pain/inflammation. Take with food. 100 tablet 2   . Magnesium Oxide 400 MG Oral Tab Take 2 tablets (800 mg) by mouth daily. 60 tablet 4   . TraZODone HCl 50 MG Oral Tab Take 1-2 tablets (50-100 mg) by mouth at bedtime. for Insomnia. 60 tablet 3     No current facility-administered medications for this visit.     Social History   Substance Use Topics   . Smoking status: Former Smoker -- 0.25 packs/day for 2 years     Types: Cigarettes   . Smokeless tobacco: Former  Neurosurgeon     Quit date: 04/04/2015   . Alcohol Use: 0.0 oz/week     0 Standard drinks or equivalent per week      Comment: Rare  ~ 1 beer per mo      Physical Exam:  General appearance: Patient appears well, in no acute disdress. vital signs are as noted by the nurse.    HEENT: PERRLA. Ears normal.  Oropharynx normal. Nasal mucousa normal without enlarged turbinates.  Bilateral maxillary sinuses tender.   Neck: supple. No adenopathy in the neck.    Chest: The chest is clear, without wheezes or rales. No egophony. No chest pain with palpation.  Heart: regular beat. No murmurs/gallops/rubs.    ASSESSMENT/PLAN:  (J01.00) Acute maxillary sinusitis, recurrence not specified  (primary encounter diagnosis)  Plan: Amoxicillin 500 MG Oral Cap   supportive managment: rest, fluids and symptomatic treatment.  Try warm compress and sinus irrigation. follow up as needed.     ##################     Health Maintenance Due  Topic   . Influenza Vaccine (1)   . Pap Smear    . Chlamydia Screen    . Gonorrhea Screen         Reviewed overdue health maintenance topics with patient.

## 2015-09-11 NOTE — Patient Instructions (Addendum)
Ibuprofen  3 times daily for 3-5 days with food, fluids.     Sinus rinse and warm compress.     It was a pleasure to see you in clinic today.                If you are not yet signed up for eCare, please see the below eCare section at the end of this document for how to enroll and to get your access codes.  It's easy to sign up at home today.    eCare  enrollment will allow you access to the below benefits   You can make appointments online   View test results / Lab Results   Request prescription renewals   Obtain a copy of our After Visit Summary (an electronic copy of this document)     Your Test Results:  If labs were ordered today the results are expected to be available via eCare in about 5 days. If you have an active eCare account, this is how we will notify you of your results.     If you do not have an eCare account then your test results will be mailed to you within about 14 days after your tests are completed. If your physician needs to change your care based on your results or is concerned, you should expect a phone call or eCare message from your provider.    If you have any questions about your test results please schedule an appointment with your provider, so that we may review them with you in greater detail.    **If it has been more than 2 weeks and you have not received your test results please send our office a message via eCare.    Medication Refills: If you need a prescription refilled, please contact your pharmacy 1 week before your current supply will run out to request the refill.  Contacting your pharmacy is the fastest and safest way to obtain a medication refill.  The pharmacy will notify our office.  Please note, that a minimum of 48 to 72 hours is needed to refill a medication,  Please call your pharmacy early to allow enough time to refill before you anticipate running out.  For faster medication refills, you can also schedule an appointment with your provider.    We know you  have a choice in where you receive your healthcare and we sincerely thank you for trusting [facility] with your health.

## 2015-09-11 NOTE — Patient Instructions (Signed)
Keep up the good work with your diet; keep a bleeding calendar.  You may want to try to use lubricant with IC.  Expect that you will have some irratic bleeding    Walk a lot!!

## 2015-09-11 NOTE — Progress Notes (Signed)
BP 132/87 mmHg  Pulse 80  Temp(Src) 98.2 F (36.8 C) (Temporal)  Ht  (1.575 m)  Wt 248 lb (112.492 kg)  BMI 45.35 kg/m2  LMP 08/26/2015  This note was dictated by Neale Burly, MD.

## 2015-09-11 NOTE — Progress Notes (Signed)
Reviewed eCare status with Patient:  YES    HEALTH MAINTENANCE:  Has the patient had any of these since their last visit?    Cervical screening/PAP: Due     Mammo: Not Due    Colon Screen: Not Due    Diabetic Eye Exam: Not Due      Have you seen a specialist since your last visit: No        HM Due:   Health Maintenance   Topic Date Due   . Influenza Vaccine (1) 04/02/2015   . Pap Smear  08/30/2015   . Chlamydia Screen  09/18/2015   . Gonorrhea Screen  09/18/2015   . Tetanus Vaccine  12/26/2022   . HIV Screen  Completed   . HPV Vaccine  Completed           Future Appointments  Date Time Provider Department Center   09/11/2015 4:00 PM Sylvie Farrier, MD [facility]

## 2015-09-13 LAB — URINE C/S: Culture: 50000

## 2015-09-14 LAB — R/O YEAST CULT W/DIRECT EXAM: Stain For Fungus: NONE SEEN

## 2015-09-14 NOTE — Progress Notes (Signed)
Kristen Salinas, Kristen Salinas          [id number]          09/11/2015      WOMEN'S/GYN CLINIC NOTE       REASON FOR VISIT     This is a 25 year old female who presents today complaining of irregular bleeding.    HISTORY OF PRESENT ILLNESS     The patient had a Norplant placed in late December.  She previously had a Norplant and during that time, her first year, she had very little bleeding, but following that year, she had irregular bleeding and finally had it removed.  This was then followed by oral contraceptives for which she again had irregular bleeding and finally, she opted for another Norplant.  She was counseled prior to the Norplant placement to expect some irregular bleeding.  The first month after its placement she had very little bleeding, but in the last 2-1/2 weeks she has had near daily spotting.  It is not enough to soak a pad, it happens more when she uses the restroom, but it is concerning to her.  She has no cramping with it.  She does feel that it may irritate her vestibule to have the constant bleeding.  She is able to use a tampon and has some discomfort when she removes it, mostly from vaginal dryness though, rather than vestibular pain.    The patient is also wondering if she might have a bladder infection.  She has had bladder infections previously, although her last few urine cultures have been negative.  She also has no symptoms of yeast at this time.    The patient's weight is stable in the last month.  We talked about weight loss as exogenous estrogen from adipose tissue may well be something that is contributing to her abnormal bleeding.  The patient understands this and knows that weight loss is a priority.  She and her partner have recently started a low-carbohydrate high-protein diet.  She is trying to walk as she is able to.  The patient still has some social disarray.  She is currently living with her partner's parents.  They are waiting for their motorhome to get fixed so they can move  into it.  Her partner did just get a new job and so that is encouraging.    The patient also thinks she has a sinus infection and is going to go see her primary care doctor today.    PHYSICAL EXAMINATION     VITAL SIGNS:  Noted and her blood pressure is 132/87 which is fairly consistent for her.  PELVIC:  On directed exam, her external genitalia are without any evidence of erythema or fissuring or excoriations.  Her vestibule itself is not erythematous and is very mildly tender to Q-tip, especially at 6 o'clock.  The speculum reveals dark brown to dark red blood in the vault, a small amount.  A wet mount as well as a yeast culture was collected.  On bimanual exam, she has no cervical motion tenderness.  Her cervix is smooth and her adnexa also are nontender.    STUDIES     Under wet mount, no hyphae are seen.  She has normal epithelial cells.  The pH was not able to be evaluated due to her menses.    Urine dip reveals no nitrites and no leukocytes.  She does have ketones and blood present.    ASSESSMENT AND PLAN     1. Abnormal bleeding to be  expected with Nexplanon.  2. Encouraged the patient to keep track of her bleeding and realize that this may well happen.  Encouraged her with weight loss.  3. Should the bleeding continue persistently for say 6 to 8 weeks, consider adding some p.o. progesterone, that could be an option.  The patient is likely making exogenous estrogen via adipose tissue, so also encouraged weight loss.  4. Encouraged exercise as well.  5. Encouraged use of lubricant with intercourse as frequent tampon use can cause vaginal dryness.  6. Send a urine culture as well as a yeast culture.  7. The patient is encouraged to return to clinic in 3 months.

## 2015-09-24 ENCOUNTER — Encounter (HOSPITAL_BASED_OUTPATIENT_CLINIC_OR_DEPARTMENT_OTHER): Payer: Self-pay | Admitting: Obstetrics/Gynecology

## 2015-09-29 ENCOUNTER — Ambulatory Visit
Admission: RE | Admit: 2015-09-29 | Discharge: 2015-09-29 | Disposition: A | Discharge status: Home / Self Care | Payer: No Typology Code available for payment source | Attending: Obstetrics/Gynecology | Admitting: Obstetrics/Gynecology

## 2015-09-29 ENCOUNTER — Ambulatory Visit (HOSPITAL_BASED_OUTPATIENT_CLINIC_OR_DEPARTMENT_OTHER): Payer: No Typology Code available for payment source | Admitting: Obstetrics/Gynecology

## 2015-09-29 DIAGNOSIS — M62838 Other muscle spasm: Secondary | ICD-10-CM

## 2015-09-29 DIAGNOSIS — N94810 Vulvar vestibulitis: Secondary | ICD-10-CM

## 2015-09-29 DIAGNOSIS — R102 Pelvic and perineal pain: Secondary | ICD-10-CM | POA: Insufficient documentation

## 2015-09-29 LAB — GLUCOSE POC, ~~LOC~~: Glucose (POC): 90 mg/dL (ref 62–125)

## 2015-10-07 ENCOUNTER — Encounter (HOSPITAL_BASED_OUTPATIENT_CLINIC_OR_DEPARTMENT_OTHER): Payer: Self-pay | Admitting: Obstetrics/Gynecology

## 2015-10-07 DIAGNOSIS — N939 Abnormal uterine and vaginal bleeding, unspecified: Secondary | ICD-10-CM

## 2015-10-07 MED ORDER — NORETHINDRONE 0.35 MG OR TABS
0.3500 mg | ORAL_TABLET | Freq: Every day | ORAL | Status: DC
Start: 2015-10-07 — End: 2015-10-16

## 2015-10-16 ENCOUNTER — Ambulatory Visit (INDEPENDENT_AMBULATORY_CARE_PROVIDER_SITE_OTHER): Payer: No Typology Code available for payment source | Admitting: Family Medicine

## 2015-10-16 ENCOUNTER — Encounter (INDEPENDENT_AMBULATORY_CARE_PROVIDER_SITE_OTHER): Payer: Self-pay | Admitting: Family Medicine

## 2015-10-16 VITALS — BP 132/83 | HR 84 | Temp 98.6°F | Resp 16 | Ht 62.0 in | Wt 245.6 lb

## 2015-10-16 DIAGNOSIS — F320 Major depressive disorder, single episode, mild: Secondary | ICD-10-CM

## 2015-10-16 DIAGNOSIS — J45990 Exercise induced bronchospasm: Secondary | ICD-10-CM

## 2015-10-16 DIAGNOSIS — F5101 Primary insomnia: Secondary | ICD-10-CM

## 2015-10-16 MED ORDER — ALBUTEROL SULFATE HFA 108 (90 BASE) MCG/ACT IN AERS
2.0000 | INHALATION_SPRAY | RESPIRATORY_TRACT | Status: AC | PRN
Start: 2015-10-16 — End: ?

## 2015-10-16 MED ORDER — AEROCHAMBER MV MISC
1.0000 | Status: AC
Start: 2015-10-16 — End: ?

## 2015-10-16 MED ORDER — TRAZODONE HCL 50 MG OR TABS
50.0000 mg | ORAL_TABLET | Freq: Every evening | ORAL | Status: DC
Start: 2015-10-16 — End: 2016-04-25

## 2015-10-16 MED ORDER — DULOXETINE HCL 30 MG OR CPEP
30.0000 mg | DELAYED_RELEASE_CAPSULE | Freq: Every day | ORAL | Status: DC
Start: 2015-10-16 — End: 2015-11-19

## 2015-10-16 MED ORDER — DULOXETINE HCL 20 MG OR CPEP
20.0000 mg | DELAYED_RELEASE_CAPSULE | Freq: Every day | ORAL | Status: DC
Start: 2015-10-16 — End: 2015-12-25

## 2015-10-16 NOTE — Patient Instructions (Signed)
It was a pleasure to see you in clinic today.                If you are not yet signed up for eCare, please see the below eCare section at the end of this document for how to enroll and to get your access codes.  It's easy to sign up at home today.    eCare  enrollment will allow you access to the below benefits   You can make appointments online   View test results / Lab Results   Request prescription renewals   Obtain a copy of our After Visit Summary (an electronic copy of this document)     Your Test Results:  If labs were ordered today the results are expected to be available via eCare in about 5 days. If you have an active eCare account, this is how we will notify you of your results.     If you do not have an eCare account then your test results will be mailed to you within about 14 days after your tests are completed. If your physician needs to change your care based on your results or is concerned, you should expect a phone call or eCare message from your provider.    If you have any questions about your test results please schedule an appointment with your provider, so that we may review them with you in greater detail.    **If it has been more than 2 weeks and you have not received your test results please send our office a message via eCare.    Medication Refills: If you need a prescription refilled, please contact your pharmacy 1 week before your current supply will run out to request the refill.  Contacting your pharmacy is the fastest and safest way to obtain a medication refill.  The pharmacy will notify our office.  Please note, that a minimum of 48 to 72 hours is needed to refill a medication,  Please call your pharmacy early to allow enough time to refill before you anticipate running out.  For faster medication refills, you can also schedule an appointment with your provider.    We know you have a choice in where you receive your healthcare and we sincerely thank you for trusting [facility] with your health.

## 2015-10-16 NOTE — Progress Notes (Signed)
Reviewed eCare status with Patient:  YES    HEALTH MAINTENANCE:  Has the patient had any of these since their last visit?    Cervical screening/PAP: Due     Mammo: Not Due    Colon Screen: Not Due    Diabetic Eye Exam: Not Due      Have you seen a specialist since your last visit: No        HM Due:   Health Maintenance   Topic Date Due   . Influenza Vaccine (1) 04/02/2015   . Pap Smear  08/30/2015   . Chlamydia Screen  09/18/2015   . Gonorrhea Screen  09/18/2015   . Tetanus Vaccine  12/26/2022   . HIV Screen  Completed   . HPV Vaccine  Completed           Future Appointments  Date Time Provider Department Center   10/16/2015 12:45 PM Cecelia ByarsAnderson, Sandra Jean, MD [facility]
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
## 2015-10-19 ENCOUNTER — Telehealth (INDEPENDENT_AMBULATORY_CARE_PROVIDER_SITE_OTHER): Payer: Self-pay | Admitting: Family Medicine

## 2015-10-19 NOTE — Telephone Encounter (Signed)
Prior Authorization for: Duloxetine   Dosage Amount:  30MG  Cap     [facility]     [place] [facility] Phone #: [phone]    [facility] [phone]  Insurance ID: [id number]    Case # (if applicable): n/a    Gave form to Doctor (Yes/No) No, [facility] and [facility]     Dr. Asencion PartridgeSandra Anderson

## 2015-10-21 ENCOUNTER — Encounter (HOSPITAL_BASED_OUTPATIENT_CLINIC_OR_DEPARTMENT_OTHER)
Payer: No Typology Code available for payment source | Admitting: Student in an Organized Health Care Education/Training Program

## 2015-10-21 NOTE — Telephone Encounter (Signed)
PA for 30 MG has not been approved by insurance as patient filled her old script of 40 MG on 10/02/15-so they would not proceed with the 30 MG Prior Authorization     I can postpone PA until after 11/02/15    Forwarding to provider as an FYI,  Please send back to me for reminder on 11/02/15

## 2015-10-22 NOTE — Telephone Encounter (Signed)
Yes, please do postpone and let patient know  thanks

## 2015-10-22 NOTE — Telephone Encounter (Signed)
Noted, closing TE and postponing to 11/02/15

## 2015-10-23 ENCOUNTER — Ambulatory Visit (HOSPITAL_BASED_OUTPATIENT_CLINIC_OR_DEPARTMENT_OTHER)
Payer: No Typology Code available for payment source | Attending: Obstetrics & Gynecology | Admitting: Obstetrics & Gynecology

## 2015-10-23 ENCOUNTER — Encounter (HOSPITAL_BASED_OUTPATIENT_CLINIC_OR_DEPARTMENT_OTHER): Payer: Self-pay

## 2015-10-23 VITALS — BP 135/86 | HR 87 | Temp 98.2°F | Ht 62.0 in | Wt 235.0 lb

## 2015-10-23 DIAGNOSIS — Z87410 Personal history of cervical dysplasia: Secondary | ICD-10-CM | POA: Insufficient documentation

## 2015-10-23 DIAGNOSIS — N762 Acute vulvitis: Secondary | ICD-10-CM

## 2015-10-23 DIAGNOSIS — N761 Subacute and chronic vaginitis: Secondary | ICD-10-CM | POA: Insufficient documentation

## 2015-10-23 DIAGNOSIS — R300 Dysuria: Secondary | ICD-10-CM

## 2015-10-23 DIAGNOSIS — N760 Acute vaginitis: Secondary | ICD-10-CM

## 2015-10-23 LAB — PR U/A NONAUTO DIPSTICK ONLY, ONSITE
Bilirubin (Indirect): NEGATIVE
Glucose, Urine: NEGATIVE mg/dL
Leukocytes: NEGATIVE
Nitrite, URN: NEGATIVE
Protein: NEGATIVE mg/dL
Urobilinogen, URN: 0.2 E.U./dL (ref 0.2–1.0)
pH, URN (UWNC): 6 (ref 5.0–8.0)

## 2015-10-23 LAB — PR WET MOUNTS INCL PREP VAGINAL CERV/SKIN SPECIMENS, ONSITE

## 2015-10-23 LAB — PR ALL POTASSIUM HYDROXIDE PREPARATIONS
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
## 2015-10-23 NOTE — Patient Instructions (Signed)
Thank you for visiting the [facility].   If you have any questions about your visit please call the triage nurse at [phone].

## 2015-10-24 LAB — GC&CHLAM NUCLEIC ACID DETECTN
Chlam Trachomatis Nucleic Acid: NEGATIVE
N.Gonorrhoeae(GC) Nucleic Acid: NEGATIVE

## 2015-10-25 LAB — URINE C/S: Culture: 10000

## 2015-10-26 LAB — R/O YEAST CULT W/DIRECT EXAM: Stain For Fungus: NONE SEEN

## 2015-10-26 LAB — TRICHOMONAS NUCLEIC ACID: Trichomonas Nucleic Acid Res.: NEGATIVE

## 2015-10-26 NOTE — Progress Notes (Signed)
I saw and evaluated the patient. I have reviewed the resident's documentation and agree with it.  Brigette Hopfer, MD

## 2015-10-27 LAB — CERVICAL CANCER SCREENING: Cytologic Impression: NEGATIVE

## 2015-10-28 ENCOUNTER — Encounter (INDEPENDENT_AMBULATORY_CARE_PROVIDER_SITE_OTHER): Payer: Self-pay | Admitting: Clinical

## 2015-10-28 NOTE — Telephone Encounter (Signed)
Routed to [employer]

## 2015-10-28 NOTE — Telephone Encounter (Signed)
SW left VM and sent eCare message to f/u.

## 2015-10-28 NOTE — Telephone Encounter (Signed)
SOCIAL WORK FOLLOW-UP NOTE    10/28/2015  Brief description: Pt requesting support around medication and re-establishing with [facility].      Patient description, presenting problem, patient / family goals: Pt reports not doing well, "I've been on duloxetine for a while, bumped it up to 60mg , still taking Trazodone.  In Jan saw Dareen Pianonderson, prescribed Buspar for a couple weeks, I told her I was feeling irritable, was still taking Hydroxyzine, Cymbalta and Trazodone.  I couldn't wake up the next day, was really groggy.  Stopped taking Hydroxyzine.  Stopped Buspar 2 weeks ago.  Ever since then, my mind won't stop, making me concerned.  Have appt Dareen PianoAnderson tomorrow, told her my goal is to try to get off the meds, I don't want to live on that stuff.  She bumped me down to 30mg , was on 40mg , just started that last night... Ever since I stopped taking Buspar, my mind won't stop and it's repeating things.  I don't know really want to do... I'm not sleeping, was having these days last week where 1 night I'd get really good sleep, next night I'd wake up and would try to lay back down, my mind just wouldn't stop.  Of course I had that pattern, the next night I was a really tired, now I couldn't fall asleep.  I'll have a conversation with my bf, he'll say a simple sentence or word, it'll have to be repeated 3 times, I try to make my mind quiet."  Pt denies SI, "it's just scary."      Pt meeting with PCP tomorrow 3/30.  SW spoke with [facility], will have PCP assess at appt, will refer to Dr. Marianne Sofiaoor for psych consult if appropriate.  Pt would be available to see Dr. Marianne Sofiaoor 4/7.    Pt was est with [facility], "I really liked."  Pt reports she stopped seeing Matt because of surgery and moved into RV, received [facility] letter that they closed her account.  Pt reports interest in "cognitive therapy and every day life skills."  [phone] (3 way call with pt), pt needs to complete walk-in appt to  re-est, [facility], walk in hours are T 8am-2pm Susy Frizzle(Matt), W 8am-1pm, F 11am-1pm.    Intervention / Outcome / Plan: SW sent eCare message to explain plan to pt.  Routing to PCP and HN.  SW will call pt to f/u after 3/30 PCP appt/

## 2015-10-28 NOTE — Telephone Encounter (Signed)
Runner, broadcasting/film/videoouting eCare message to [place], [place].

## 2015-10-29 ENCOUNTER — Ambulatory Visit (INDEPENDENT_AMBULATORY_CARE_PROVIDER_SITE_OTHER): Payer: No Typology Code available for payment source | Admitting: Family Medicine

## 2015-10-29 ENCOUNTER — Telehealth (INDEPENDENT_AMBULATORY_CARE_PROVIDER_SITE_OTHER): Payer: Self-pay | Admitting: Clinical

## 2015-10-29 ENCOUNTER — Encounter (INDEPENDENT_AMBULATORY_CARE_PROVIDER_SITE_OTHER): Payer: Self-pay | Admitting: Family Medicine

## 2015-10-29 VITALS — BP 123/86 | HR 89 | Temp 98.6°F | Resp 16 | Ht 62.0 in | Wt 244.4 lb

## 2015-10-29 DIAGNOSIS — F4310 Post-traumatic stress disorder, unspecified: Secondary | ICD-10-CM

## 2015-10-29 MED ORDER — HYDROXYZINE HCL 25 MG OR TABS
25.0000 mg | ORAL_TABLET | ORAL | Status: AC | PRN
Start: 2015-10-29 — End: ?

## 2015-10-29 NOTE — Progress Notes (Signed)
Reviewed eCare status with Patient:  YES    HEALTH MAINTENANCE:  Has the patient had any of these since their last visit?    Cervical screening/PAP: Not Due     Mammo: Not Due    Colon Screen: Not Due    Diabetic Eye Exam: Not Due      Have you seen a specialist since your last visit: No        HM Due:   Health Maintenance   Topic Date Due   . Influenza Vaccine (1) 04/02/2015   . Chlamydia Screen  10/22/2016   . Pap Smear  10/22/2016   . Gonorrhea Screen  10/22/2016   . Tetanus Vaccine  12/26/2022   . HIV Screen  Completed   . HPV Vaccine  Completed           Future Appointments  Date Time Provider Department Center   10/29/2015 12:30 PM Cecelia ByarsAnderson, Sandra Jean, MD [facility]   10/30/2015 1:40 PM [facility]

## 2015-10-29 NOTE — Patient Instructions (Signed)
It was a pleasure to see you in clinic today.                If you are not yet signed up for eCare, please see the below eCare section at the end of this document for how to enroll and to get your access codes.  It's easy to sign up at home today.    eCare  enrollment will allow you access to the below benefits   You can make appointments online   View test results / Lab Results   Request prescription renewals   Obtain a copy of our After Visit Summary (an electronic copy of this document)     Your Test Results:  If labs were ordered today the results are expected to be available via eCare in about 5 days. If you have an active eCare account, this is how we will notify you of your results.     If you do not have an eCare account then your test results will be mailed to you within about 14 days after your tests are completed. If your physician needs to change your care based on your results or is concerned, you should expect a phone call or eCare message from your provider.    If you have any questions about your test results please schedule an appointment with your provider, so that we may review them with you in greater detail.    **If it has been more than 2 weeks and you have not received your test results please send our office a message via eCare.    Medication Refills: If you need a prescription refilled, please contact your pharmacy 1 week before your current supply will run out to request the refill.  Contacting your pharmacy is the fastest and safest way to obtain a medication refill.  The pharmacy will notify our office.  Please note, that a minimum of 48 to 72 hours is needed to refill a medication,  Please call your pharmacy early to allow enough time to refill before you anticipate running out.  For faster medication refills, you can also schedule an appointment with your provider.    We know you have a choice in where you receive your healthcare and we sincerely thank you for trusting [facility] with your health.
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
## 2015-10-29 NOTE — Progress Notes (Signed)
S: Pt  presents for follow up on sleep disturbance, anxiety and depression.    Symptoms have been worsening since stopping buspar, hydroxyzine, and cutting back on cymbalta to 30 mg per day.  Bothersome symptoms still include insomnia, fatigue, difficulty concentrating and anxiety.  Pt denies SI/HI, A/V hallucinations. The patient HAS been taking medications as noted.  Side effects include none. The patient HAS NOT been going to counseling, recently.  Current Stressors:  Finances, past trauma.    More anxiousness than feeling depressed  Would like to wean off meds - does not want to be dependent on them  Worries about not being able to     Over the last 2 weeks how often have you been bothered by any of the following problems?  1. Little interest or pleasure in doing things: (!) More than half the days  2. Feeling down, depressed or hopeless: (!) More than half the days  3. Trouble falling or staying asleep, or sleeping too much: (!) Nearly every day  4. Feeling tired or having little energy : (!) Nearly every day  5. Poor appetite or overeating: Not at all  6. Feeling bad about yourself - or that you are a failure or have let yourself or your family down: Several days  7. Trouble concentrating on things, such as reading the newspaper or watching television: (!) Nearly every day  8. Moving or speaking much more slowly than usual.  Or the opposite - fidgety or restless: Not at all  9. Thoughts that you would be better off dead or of hurting yourself in some way: Not at all    Total  PHQ9 Total Score: 14    10. If you checked off any problems, how difficult have these problems made it for you to do your work, take care of things at home, or get along with other people?: Extremely difficult      GAD7 Score, Total: 17  GAD7 Score, Difficulty: If you checked any problems, how difficult have they made it for you to do your work, take care of things at home, or get along with other people?: Extremely  difficult    Substantial increase in symptoms over last 2 weeks.      O: BP 123/86 mmHg  Pulse 89  Temp(Src) 98.6 F (37 C) (Temporal)  Resp 16  Ht 5\' 2"  (1.575 m)  Wt 244 lb 6.4 oz (110.859 kg)  BMI 44.69 kg/m2  SpO2 99%  LMP 10/12/2015   Affect normal affect, good eye contact.  neatly groomed, no agitation, normal speech  Here with husband.  Further exam deferred.    A/P:   (F43.10) PTSD (post-traumatic stress disorder)  (primary encounter diagnosis)  Plan: HydrOXYzine HCl 25 MG Oral Tab - restart          Stay at Cymbalta 30 mg            Agree with counseling          Consider consult with Dr Marianne Sofiaoor in future re other med options    Reminded pt of importance of self care (eg adequate sleep hours, healthy diet, exercise and renewing activities).      F/u in 2 weeks.    Time of this visit with counseling was 20 minutes.

## 2015-10-30 ENCOUNTER — Telehealth (INDEPENDENT_AMBULATORY_CARE_PROVIDER_SITE_OTHER): Payer: Self-pay | Admitting: Clinical

## 2015-10-30 ENCOUNTER — Ambulatory Visit (HOSPITAL_BASED_OUTPATIENT_CLINIC_OR_DEPARTMENT_OTHER)
Payer: No Typology Code available for payment source | Attending: Obstetrics/Gynecology | Admitting: Obstetrics & Gynecology

## 2015-10-30 VITALS — BP 132/76 | Temp 98.2°F | Ht 63.0 in | Wt 245.0 lb

## 2015-10-30 DIAGNOSIS — N760 Acute vaginitis: Secondary | ICD-10-CM

## 2015-10-30 DIAGNOSIS — N762 Acute vulvitis: Secondary | ICD-10-CM

## 2015-10-30 MED ORDER — TRIAMCINOLONE ACETONIDE 0.1 % EX CREA
1.0000 | TOPICAL_CREAM | Freq: Every evening | CUTANEOUS | Status: DC
Start: 2015-10-30 — End: 2016-02-12

## 2015-10-30 NOTE — Telephone Encounter (Signed)
SOCIAL WORK FOLLOW-UP NOTE    10/30/2015  Brief description: Pt calling to f/u on [facility] referral.    Patient description, presenting problem, patient / family goals: Pt left VM saying she's having a hard time getting in touch with [facility].      Intervention / Outcome / Plan: SW notices that pt is scheduled with [facility] for 4/3, SW left VM and sent eCare message to f/u.
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
## 2015-10-30 NOTE — Patient Instructions (Signed)
Thank you for visiting the [facility].   If you have any questions about your visit please call the triage nurse at [phone].

## 2015-11-02 ENCOUNTER — Ambulatory Visit (HOSPITAL_BASED_OUTPATIENT_CLINIC_OR_DEPARTMENT_OTHER): Payer: No Typology Code available for payment source

## 2015-11-02 DIAGNOSIS — F4310 Post-traumatic stress disorder, unspecified: Secondary | ICD-10-CM

## 2015-11-03 NOTE — Progress Notes (Signed)
I saw and evaluated the patient. I have reviewed the resident's documentation and agree with it.

## 2015-11-06 NOTE — Telephone Encounter (Signed)
Spoke to pharmacy,  Script of Duloxetine 30 mg was approved and they show a paid claim for this on 10/26/15,  Per [facility] is good for a year

## 2015-11-09 ENCOUNTER — Encounter (HOSPITAL_BASED_OUTPATIENT_CLINIC_OR_DEPARTMENT_OTHER): Payer: No Typology Code available for payment source | Admitting: Obstetrics/Gynecology
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
## 2015-11-13 ENCOUNTER — Encounter (INDEPENDENT_AMBULATORY_CARE_PROVIDER_SITE_OTHER): Payer: Self-pay | Admitting: Family Medicine

## 2015-11-13 ENCOUNTER — Ambulatory Visit (HOSPITAL_BASED_OUTPATIENT_CLINIC_OR_DEPARTMENT_OTHER): Payer: No Typology Code available for payment source

## 2015-11-13 DIAGNOSIS — F4310 Post-traumatic stress disorder, unspecified: Secondary | ICD-10-CM
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
## 2015-11-19 ENCOUNTER — Encounter (INDEPENDENT_AMBULATORY_CARE_PROVIDER_SITE_OTHER): Payer: Self-pay | Admitting: Family Medicine

## 2015-11-19 ENCOUNTER — Ambulatory Visit (INDEPENDENT_AMBULATORY_CARE_PROVIDER_SITE_OTHER): Payer: No Typology Code available for payment source | Admitting: Family Medicine

## 2015-11-19 VITALS — BP 123/81 | HR 98 | Temp 98.7°F | Resp 12 | Ht 63.0 in | Wt 247.4 lb

## 2015-11-19 DIAGNOSIS — G2581 Restless legs syndrome: Secondary | ICD-10-CM

## 2015-11-19 DIAGNOSIS — F4310 Post-traumatic stress disorder, unspecified: Secondary | ICD-10-CM

## 2015-11-19 DIAGNOSIS — E6609 Other obesity due to excess calories: Secondary | ICD-10-CM

## 2015-11-19 DIAGNOSIS — F5101 Primary insomnia: Secondary | ICD-10-CM

## 2015-11-19 DIAGNOSIS — E282 Polycystic ovarian syndrome: Secondary | ICD-10-CM

## 2015-11-19 LAB — IRON BINDING CAPACITY (W/IRON, TRANSFERRIN & TRANSF SAT)
Iron, SRM: 78 ug/dL (ref 31–171)
Total Iron Binding Capacity: 365 ug/dL (ref 270–535)
Transferrin Saturation: 21 % (ref 10–45)
Transferrin: 261 mg/dL (ref 192–382)

## 2015-11-19 LAB — COMPREHENSIVE METABOLIC PANEL
ALT (GPT): 16 U/L (ref 7–33)
AST (GOT): 15 U/L (ref 9–38)
Albumin: 4.3 g/dL (ref 3.5–5.2)
Alkaline Phosphatase (Total): 47 U/L (ref 26–98)
Anion Gap: 10 (ref 4–12)
Bilirubin (Total): 0.4 mg/dL (ref 0.2–1.3)
Calcium: 9.5 mg/dL (ref 8.9–10.2)
Carbon Dioxide, Total: 24 meq/L (ref 22–32)
Chloride: 107 meq/L (ref 98–108)
Creatinine: 0.82 mg/dL (ref 0.38–1.02)
GFR, Calc, African American: >60 mL/min/{1.73_m2}
GFR, Calc, European American: >60 mL/min/{1.73_m2}
Glucose: 91 mg/dL (ref 62–125)
Potassium: 4.2 meq/L (ref 3.6–5.2)
Protein (Total): 6.7 g/dL (ref 6.0–8.2)
Sodium: 141 meq/L (ref 135–145)
Urea Nitrogen: 11 mg/dL (ref 8–21)

## 2015-11-19 LAB — FERRITIN: Ferritin: 61 ng/mL (ref 10–180)

## 2015-11-19 LAB — PR A1C RAPID, ONSITE: Hemoglobin A1C: 4.9 % (ref 4.0–6.0)

## 2015-11-19 MED ORDER — ROPINIROLE HCL 0.25 MG OR TABS
ORAL_TABLET | ORAL | Status: DC
Start: 2015-11-19 — End: 2016-03-23

## 2015-11-19 NOTE — Progress Notes (Signed)
S: Pt  presents for follow up on sleep disturbance, anxiety and PTSD.    Symptoms have improved.  Bothersome symptoms still include insomnia and anxiety.  Pt denies SI/HI, A/V hallucinations. The patient HAS been taking medications as noted.  Side effects include none. The patient HAS been going to counseling, [facility] and specializes in trauma, PTSD  Current Stressors:  No new.    Using Hydroxyzine ~ 1 time per day    Feels ready to cut down to 20 mg Cymbalta    Drove self to appt today, usually too anxious to drive self    Notes restless leg symptoms aggravated, more bothersome at night, make it hard to go to sleep  Tried gabapentin in recent past mid 2016 for CTS, had trouble tolerating even low dose (100-300) due to feeling groggy in am    O:  BP 123/81 mmHg  Pulse 98  Temp(Src) 98.7 F (37.1 C) (Temporal)  Resp 12  Ht 5\' 3"  (1.6 m)  Wt 247 lb 6.4 oz (112.22 kg)  BMI 43.84 kg/m2  SpO2 96%  LMP 11/16/2015  Affect normal affect, good eye contact.  neatly groomed,  no agitation, normal speech  Further exam deferred.    A/P:  (F43.10) PTSD (post-traumatic stress disorder)  (primary encounter diagnosis)  Ok to take next step down with Cymbalta to 20 mg    (F51.01) Primary insomnia  Plan: FERRITIN, IRON BINDING CAPACITY/TOT IRON    (G25.81) Restless leg syndrome  Plan: FERRITIN, IRON BINDING CAPACITY/TOT IRON,         ROPINIRole HCl 0.25 MG Oral Tab, COMPREHENSIVE         METABOLIC PANEL    (E28.2) PCOS (polycystic ovarian syndrome)  Plan: A1C RAPID, ONSITE  \  (E66.09) Obesity due to excess calories, unspecified obesity severity  Plan: A1C RAPID, ONSITE        Reminded pt of importance of self care (eg adequate sleep hours, healthy diet, exercise and renewing activities).  F/u in 1 month.    Time of this visit with counseling was 25 minutes.

## 2015-11-23 ENCOUNTER — Telehealth (INDEPENDENT_AMBULATORY_CARE_PROVIDER_SITE_OTHER): Payer: Self-pay | Admitting: Family Medicine

## 2015-11-23 ENCOUNTER — Telehealth (INDEPENDENT_AMBULATORY_CARE_PROVIDER_SITE_OTHER): Payer: Self-pay | Admitting: Clinical

## 2015-11-23 NOTE — Telephone Encounter (Signed)
Approved via Navinet-No dates given  Pharmacy notified     Closing TE

## 2015-11-23 NOTE — Telephone Encounter (Signed)
SOCIAL WORK FOLLOW-UP NOTE    11/23/2015  Brief description: Covering SW calling to f/u on [facility] referral.      Patient description, presenting problem, patient / family goals: [facility], I've seen Kristen Salinas [facility]) twice now, "it seems to be going well."  Pt reports Kristen Salinas will help set up [facility] [place] for long-term trauma therapy before ending [facility], have appt Friday with Kristen Salinas.      PCP re-started Hydroxyzine, "that's been fine, I feel like I've gotten a grasp back on my mind.  There's sometimes where it's a little much, but it's no where near where it was."  Pt not interested in Dr. Marianne Sofiaoor psychiatry consult, moving forward to decreasing Cymbalta to 20mg , "my end goal is being off of everything, my main goal not is to just be on Hydroxyzine.  She's moving at a pace I'm comfortable with."      Intervention / Outcome / Plan: Pt will call SW if additional support needed.

## 2015-11-23 NOTE — Telephone Encounter (Signed)
Prior Authorization for:  Duloxetine   Dosage Amount: 20 MG Oral Cap     [facility]     [place] [facility] Phone #: [phone]    [facility]ing [phone]    Insurance ID: [id number]    Case # (if applicable): n/a     Gave form to Doctor (Yes/No) no, submitted via Navinet [facility]    Dr. Asencion PartridgeSandra Anderson     Postponed to 11/27/15 for follow up

## 2015-11-27 ENCOUNTER — Ambulatory Visit (HOSPITAL_BASED_OUTPATIENT_CLINIC_OR_DEPARTMENT_OTHER): Payer: No Typology Code available for payment source

## 2015-11-27 DIAGNOSIS — F4310 Post-traumatic stress disorder, unspecified: Secondary | ICD-10-CM

## 2015-11-27 NOTE — Progress Notes (Signed)
[facility] PROGRESS NOTE     VISIT INFORMATION   Location:  [facility]    Duration of session:  60 min    SUBJECTIVE   CC/Reason for referral:  Hx of SA  Relevant interval history:  n/a  Primary clinical model:  CETA    Homework/compliance with plan:  n/a  Current symptoms/complaints:  Depression   Primary Clinical Targets:  PTS, Depression and Anxiety    Standardized Measure Scores:  n/a  Current session content:  Coping Skills    Brief description of interventions:  This therapist began working with pt on emotion regulation.  This therapist defined anxiety and depression.  This therapist discussed mental and physical cues of anxiety/ depression.  This therapist introduced coping skills.     OBJECTIVE   Patient presentation:  appropriate    ASSESSMENT/DIAGNOSIS   Primary diagnostic category: trauma and stressor-related disorders    Secondary diagnostic category: n/a    Specific diagnosis and ICD-10 code:    (F43.10) PTSD (post-traumatic stress disorder)  (primary encounter diagnosis)      Update on progress:  Pt remains engaged.     PLAN   Next session and plan:  5/5  Assigned homework:  Coping tools  Daily transaction log completed:  No  Further recommendations/consultations:  No    NOTES   none

## 2015-12-03 ENCOUNTER — Telehealth (HOSPITAL_BASED_OUTPATIENT_CLINIC_OR_DEPARTMENT_OTHER): Payer: Self-pay | Admitting: Obstetrics/Gynecology

## 2015-12-03 DIAGNOSIS — R300 Dysuria: Secondary | ICD-10-CM

## 2015-12-03 NOTE — Telephone Encounter (Signed)
Kristen Salinas says she has pain in her bladder. Please contact patient to advise/assist.

## 2015-12-03 NOTE — Telephone Encounter (Signed)
She should make an appointment for a postvoid residual to evaluate for urinary retention as well as UA and urine culture to evaluate for a bladder infection as a nurse visit, and then follow up with me 5/15.

## 2015-12-03 NOTE — Telephone Encounter (Signed)
Situation:  Patient requesting sooner appt to followup on bladder pain.  Background: Patient saw Dr. Craige CottaKirby on 09/29/15 for trigger point injections.  Assessment:  Patient states that a couple of days ago she experienced bladder cramping after she felt as though she had to force a urine stream. Patient reports that she usually double voids to feel as though she is emptying her bladder completely. Patient reports that the most recent bladder cramping episode happened a couple of days ago. Patient states that the bladder cramping resolved a couple of hours later but she is still feeling as though her bladder is still irritated. Patient states that she has experienced the same symptoms in the past.  Patient is requesting an appt with Dr. Craige CottaKirby to followup on this. [facility] but [facility] if she is able to get in sooner than 12/14/15.  Recommendation:  Sending TE to Dr. Craige CottaKirby for her response.

## 2015-12-04 ENCOUNTER — Ambulatory Visit: Payer: No Typology Code available for payment source | Attending: Obstetrics/Gynecology

## 2015-12-04 ENCOUNTER — Ambulatory Visit (HOSPITAL_BASED_OUTPATIENT_CLINIC_OR_DEPARTMENT_OTHER): Payer: No Typology Code available for payment source

## 2015-12-04 ENCOUNTER — Telehealth (INDEPENDENT_AMBULATORY_CARE_PROVIDER_SITE_OTHER): Payer: Self-pay

## 2015-12-04 DIAGNOSIS — R300 Dysuria: Secondary | ICD-10-CM | POA: Insufficient documentation

## 2015-12-04 DIAGNOSIS — F4310 Post-traumatic stress disorder, unspecified: Secondary | ICD-10-CM

## 2015-12-04 LAB — URINALYSIS COMPLETE, URN
Bacteria, URN: NONE SEEN
Bilirubin (Qual), URN: NEGATIVE
Epith Cells_Renal/Trans,URN: NEGATIVE /HPF
Epith Cells_Squamous, URN: NEGATIVE /LPF
Glucose Qual, URN: NEGATIVE mg/dL
Ketones, URN: NEGATIVE mg/dL
Leukocyte Esterase, URN: NEGATIVE
Nitrite, URN: NEGATIVE
Occult Blood, URN: NEGATIVE
Protein (Alb Semiquant), URN: NEGATIVE mg/dL
RBC, URN: NEGATIVE /HPF
Specific Gravity, URN: 1.021 g/mL (ref 1.002–1.027)
WBC, URN: NEGATIVE /HPF
pH, URN: 5.5 (ref 5.0–8.0)

## 2015-12-04 NOTE — Telephone Encounter (Signed)
Pt reported bladder cramping and irritation; she was advised to come to clinic for UA and PVR per Dr. Craige CottaKirby.    Went to the bathroom, "double" voided.  PVR was 148 cc's. Will route TE to Dr. Craige CottaKirby.

## 2015-12-04 NOTE — Telephone Encounter (Signed)
Gave patient Dr. Evlyn CourierKirby's response from below. [place] and [facility] and for a PVR.  [facility] and to leave a urine sample for a UAC and Urine C&S at 1 PM today.   Spoke with Passenger transport managerCindy RN at [facility] regarding this message.

## 2015-12-04 NOTE — Progress Notes (Signed)
Pt here for PVR per Dr. Craige CottaKirby.  Pt stopped at lab to leave sample for UA.  Went to the bathroom, "double" voided.  PVR was 148 cc's.  Will send TE to Dr. Craige CottaKirby

## 2015-12-06 LAB — URINE C/S: Colony Count: 500
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
## 2015-12-07 NOTE — Telephone Encounter (Signed)
That PVR is mildly elevated but not dangerous. Will discuss at her next visit.  5/5 urine culture negative.

## 2015-12-11 ENCOUNTER — Ambulatory Visit (HOSPITAL_BASED_OUTPATIENT_CLINIC_OR_DEPARTMENT_OTHER): Payer: No Typology Code available for payment source

## 2015-12-11 ENCOUNTER — Encounter (HOSPITAL_BASED_OUTPATIENT_CLINIC_OR_DEPARTMENT_OTHER): Payer: No Typology Code available for payment source | Admitting: Obstetrics & Gynecology

## 2015-12-11 DIAGNOSIS — F4310 Post-traumatic stress disorder, unspecified: Secondary | ICD-10-CM

## 2015-12-14 ENCOUNTER — Telehealth (INDEPENDENT_AMBULATORY_CARE_PROVIDER_SITE_OTHER): Payer: Self-pay | Admitting: Obstetrics/Gynecology

## 2015-12-14 ENCOUNTER — Encounter (INDEPENDENT_AMBULATORY_CARE_PROVIDER_SITE_OTHER): Payer: Self-pay | Admitting: Obstetrics/Gynecology

## 2015-12-14 ENCOUNTER — Ambulatory Visit: Payer: No Typology Code available for payment source | Admitting: Obstetrics/Gynecology

## 2015-12-14 VITALS — BP 136/83 | HR 84

## 2015-12-14 DIAGNOSIS — R3989 Other symptoms and signs involving the genitourinary system: Secondary | ICD-10-CM

## 2015-12-14 MED ORDER — URELLE 81 MG OR TABS
1.0000 | ORAL_TABLET | Freq: Four times a day (QID) | ORAL | Status: DC
Start: 2015-12-14 — End: 2016-03-23

## 2015-12-14 MED ORDER — URELLE 81 MG OR TABS
1.0000 | ORAL_TABLET | Freq: Four times a day (QID) | ORAL | Status: DC
Start: 2015-12-14 — End: 2015-12-14

## 2015-12-14 NOTE — Progress Notes (Signed)
[facility]  Chief Complaint   Patient presents with   . Bladder Problems     Frequant urination and bladder pain        History:  25 year old female presents today for evaluation of bladder pain.  She had a borderline elevated PVR so I asked her to come back to see me.  She describes bladder pain that started 3 weeks ago - at that time it felt like urgency right after voiding followed by bladder spasms after she tried voiding again.  Yesterday, she was voiding every 15 minutes. Has increased vulvar irritation and thinks her vaginal steroids are causing burning.  Has not tried anything for her bladder symptoms.  Azo did not help in the past.  Has been taking high dose cranberry pills.  Interested in another trigger point injection.    Exam:    Vitals: BP 136/83 mmHg  Pulse 84  LMP 11/16/2015  Neuro/psych:  Alert and oriented x 3 in a pleasant mood  Skin: Warm, dry and intact  Abdomen:  Soft, non tender, non distended, no hepatosplenomegaly or hernias.  Pelvic: normal appearing vulva and vaginal epithelium with small amount of menstrual blood.    Procedures:  Uroflow: voided 659ml, normal pattern, max flow 8541ml/second.  PVR 0ml by bladder scan    Assessment:  Bladder pain, urgency, and frequency.  UA negative 10 days ago, postvoid residual normal today.  Vulva is irritated - no evidence of irritation or infection today - follow up with Dr. Dolphus JennyEckert as scheduled and plan kenalog injections to her posterior introitus with next pelvic floor injections.    Plan:   Try Azo or Urelle to treat bladder pain.  Schedule trigger point injections (marcaine and kenalog) and vulvar kenalog injections, all of which helped in the past.  Stop cranberry and avoid other bladder irritants - list provided.    Could consider bladder instillations if needed.      I spent a total time of 15 minutes face-to-face with the patient, of which more than 50% was spent counseling about etiologies and treatments of bladder pain as outlined in  this note.

## 2015-12-14 NOTE — Telephone Encounter (Signed)
Advised pt she can use Azo and to stop using bladder irritants.  Have not received PA request yet from her pharmacy.

## 2015-12-14 NOTE — Patient Instructions (Addendum)
Stop cranberry.    Call Katheren PullerMarie Porter next week if you have not heard from her: [phone] to schedule another trigger point injection      Interstitial Cystitis Dietary Restrictions     Good Foods:  Almonds  Alcohol or wines for flavoring  Small apple  Blueberries  Carob  Coffee (non-acidic)  Extracts: brandy, rum, etc.  [place] sauternes  Imitation sour cream  Cooked onions(sometimes ok)  Orange juice acid reduced  Peanuts  Pears  Pine nuts  Processed cheeses, not aged  Spring water  Strawberries (sometimes-small portion)  Sun tea  Tomatoes- low acid  White Chocolate  Wines, late harvest  Zest of orange or limes  Shallots, green onions  All other food not on bad list    Bad List:  All alcoholic beverages  Apples  Apple juice  Avocados  Bananas  Beer  Brewer's yeast  Canned figs  Cantaloupes  Carbonate drinks  Champagne  Cheese  Chicken livers  Chilies/ spicy foods  Chocolate  Citrus fruits  Coffee  Corned Beef  Cranberries  Fava Beans  Grapes  Guava  Lemon juice  Lentils  [place]ima Beans  Mayonnaise  Nutra sweet  Nuts  Onions  Peaches  Pickled Herring  Pineapple  Plums  Prunes  Raisins  Rye Bread  Saccharine  Sour Cream  Soy Sauce  Strawberries  Tea  Tomatoes  Vinegar  Vitamins with  Aspartate, buffers  Yogurt

## 2015-12-14 NOTE — Telephone Encounter (Signed)
Situation: Kristen BridgemanJessica Nicole Salinas called.   Background: Patient was last seen in office today  Assessment: Patient stated that she received a prescription that needs prior authorization and would like to know how long it takes for the authorization and if there is another medication she can take in the mean time  Request: Please call patient at [phone] to advise

## 2015-12-15 ENCOUNTER — Telehealth (HOSPITAL_BASED_OUTPATIENT_CLINIC_OR_DEPARTMENT_OTHER): Payer: Self-pay | Admitting: Obstetrics/Gynecology

## 2015-12-15 ENCOUNTER — Encounter (INDEPENDENT_AMBULATORY_CARE_PROVIDER_SITE_OTHER): Payer: Self-pay

## 2015-12-15 NOTE — Telephone Encounter (Signed)
[facility]

## 2015-12-15 NOTE — Telephone Encounter (Signed)
(  TEXTING IS AN OPTION FOR [facility] CLINICS ONLY)  Is this a [facility] clinic? No      RETURN CALL: Detailed message on voicemail only      SUBJECT:  General Message     REASON FOR REQUEST: Patient checking on status of prior authorization paperwork for medication URELLE    MESSAGE: Please follow up with patient. Thank you.

## 2015-12-15 NOTE — Telephone Encounter (Signed)
[place] fax today from [employer], phone [phone], fax, [phone] for PA for:     Meth-Hyo-M Bl-Na Phos-Ph Sal (URELLE) 81 MG Oral Tab         Sig: Take 1 tablet by mouth 4 times a day.         Insurance: [employer], phone [phone]. ``        Pt ID: [id number]  Faxed PA request to them at [phone].    Sent pt eCare message informing her that PA process has been started.

## 2015-12-18 ENCOUNTER — Telehealth (HOSPITAL_BASED_OUTPATIENT_CLINIC_OR_DEPARTMENT_OTHER): Payer: Self-pay

## 2015-12-18 ENCOUNTER — Encounter (HOSPITAL_BASED_OUTPATIENT_CLINIC_OR_DEPARTMENT_OTHER): Payer: No Typology Code available for payment source

## 2015-12-18 ENCOUNTER — Encounter (HOSPITAL_BASED_OUTPATIENT_CLINIC_OR_DEPARTMENT_OTHER): Payer: Self-pay

## 2015-12-18 NOTE — Telephone Encounter (Signed)
Patient called to cancel her appointment today. No reason given. Thanks.

## 2015-12-22 ENCOUNTER — Encounter (HOSPITAL_BASED_OUTPATIENT_CLINIC_OR_DEPARTMENT_OTHER): Payer: No Typology Code available for payment source | Admitting: Obstetrics/Gynecology

## 2015-12-22 NOTE — Telephone Encounter (Signed)
Spoke to [place]; Urelle was approved. Pt informed.

## 2015-12-25 ENCOUNTER — Ambulatory Visit (INDEPENDENT_AMBULATORY_CARE_PROVIDER_SITE_OTHER): Payer: No Typology Code available for payment source | Admitting: Family Medicine

## 2015-12-25 ENCOUNTER — Other Ambulatory Visit (INDEPENDENT_AMBULATORY_CARE_PROVIDER_SITE_OTHER): Payer: Self-pay | Admitting: Family Medicine

## 2015-12-25 ENCOUNTER — Encounter (INDEPENDENT_AMBULATORY_CARE_PROVIDER_SITE_OTHER): Payer: Self-pay | Admitting: Family Medicine

## 2015-12-25 ENCOUNTER — Ambulatory Visit (HOSPITAL_BASED_OUTPATIENT_CLINIC_OR_DEPARTMENT_OTHER): Payer: No Typology Code available for payment source | Attending: Obstetrics & Gynecology

## 2015-12-25 ENCOUNTER — Encounter (HOSPITAL_BASED_OUTPATIENT_CLINIC_OR_DEPARTMENT_OTHER): Payer: Self-pay | Admitting: Obstetrics & Gynecology

## 2015-12-25 ENCOUNTER — Ambulatory Visit (HOSPITAL_BASED_OUTPATIENT_CLINIC_OR_DEPARTMENT_OTHER): Payer: No Typology Code available for payment source | Admitting: Obstetrics & Gynecology

## 2015-12-25 VITALS — BP 136/84 | HR 96 | Temp 98.2°F | Ht 63.0 in | Wt 251.0 lb

## 2015-12-25 VITALS — BP 138/87 | HR 105 | Temp 98.8°F | Resp 14 | Ht 63.0 in | Wt 252.0 lb

## 2015-12-25 DIAGNOSIS — B373 Candidiasis of vulva and vagina: Secondary | ICD-10-CM | POA: Insufficient documentation

## 2015-12-25 DIAGNOSIS — F320 Major depressive disorder, single episode, mild: Secondary | ICD-10-CM

## 2015-12-25 DIAGNOSIS — M25562 Pain in left knee: Secondary | ICD-10-CM

## 2015-12-25 DIAGNOSIS — F4310 Post-traumatic stress disorder, unspecified: Secondary | ICD-10-CM

## 2015-12-25 DIAGNOSIS — N926 Irregular menstruation, unspecified: Secondary | ICD-10-CM | POA: Insufficient documentation

## 2015-12-25 DIAGNOSIS — M2242 Chondromalacia patellae, left knee: Secondary | ICD-10-CM

## 2015-12-25 DIAGNOSIS — N921 Excessive and frequent menstruation with irregular cycle: Secondary | ICD-10-CM

## 2015-12-25 DIAGNOSIS — B3731 Acute candidiasis of vulva and vagina: Secondary | ICD-10-CM

## 2015-12-25 LAB — PR WET MOUNTS INCL PREP VAGINAL CERV/SKIN SPECIMENS, ONSITE
Clue Cells: ABSENT
Odor: NEGATIVE
PH, Wet Mount: 4 (ref 4.0–7.0)
Trich: ABSENT
WBC, KOH Prep (UWNC): ABSENT

## 2015-12-25 LAB — THYROID STIMULATING HORMONE: Thyroid Stimulating Hormone: 2.199 u[IU]/mL (ref 0.400–5.000)

## 2015-12-25 LAB — PR ALL POTASSIUM HYDROXIDE PREPARATIONS

## 2015-12-25 MED ORDER — LEVONORGESTREL-ETHINYL ESTRAD 0.1-20 MG-MCG OR TABS
1.0000 | ORAL_TABLET | Freq: Every day | ORAL | 12 refills | Status: DC
Start: 2015-12-25 — End: 2016-02-12

## 2015-12-25 MED ORDER — FLUCONAZOLE 150 MG OR TABS
150.0000 mg | ORAL_TABLET | Freq: Once | ORAL | 0 refills | Status: AC
Start: 2015-12-25 — End: 2015-12-25

## 2015-12-25 MED ORDER — DULOXETINE HCL 20 MG OR CPEP
20.0000 mg | DELAYED_RELEASE_CAPSULE | Freq: Every day | ORAL | 1 refills | Status: DC
Start: 2015-12-25 — End: 2016-02-25

## 2015-12-25 MED ORDER — NYSTATIN 100000 UNIT/GM EX POWD
1.0000 | Freq: Two times a day (BID) | CUTANEOUS | 0 refills | Status: DC
Start: 2015-12-25 — End: 2016-03-23

## 2015-12-25 NOTE — Progress Notes (Signed)
Kristen Salinas is a 25 year old female.    Chief Complaint   Patient presents with   . Sleep Problem     follow up   . Anxiety     follow up   . Restless Legs     follow up    . Vaginal Bleeding     x 4 weeks        HPI:  1) Anxiety and Sleep issues  Currently taking:  cymbalta 20 mg, Trazodone 50 and 1.5 tabs of Hydroxyzine 25 mg at hs  Had been weaning down on meds    Mood improved  No side effects    Wants to stay on Cymbalta    Over the last 2 weeks how often have you been bothered by any of the following problems?  1. Little interest or pleasure in doing things: Several days  2. Feeling down, depressed or hopeless: Not at all  3. Trouble falling or staying asleep, or sleeping too much: Not at all  4. Feeling tired or having little energy : Not at all  5. Poor appetite or overeating: Not at all  6. Feeling bad about yourself - or that you are a failure or have let yourself or your family down: Several days  7. Trouble concentrating on things, such as reading the newspaper or watching television: (!) Nearly every day  8. Moving or speaking much more slowly than usual.  Or the opposite - fidgety or restless: Not at all  9. Thoughts that you would be better off dead or of hurting yourself in some way: Not at all    Total  PHQ9 Total Score: 5    10. If you checked off any problems, how difficult have these problems made it for you to do your work, take care of things at home, or get along with other people?: Somewhat difficult    GAD7 Score, Total: 5  GAD7 Score, Difficulty: If you checked any problems, how difficult have they made it for you to do your work, take care of things at home, or get along with other people?: Somewhat difficult      2)   Spotting x 4 weeks  Has implanon  Wonders if needs more hormones  Seeing Gyn later today    3) Restless leg  Not bothersome now  Never started med    4) L knee pain  Flare over last 2 weeks  Pain under knee cap  Sometimes feels like will give way when  walking    Grinding sound when going up and down stairs  No popping or locking    No acute injury  No swelling or redness    The above HPI includes the following elements:  context, duration, quality, severity and modifying factors    ROS:   Constitutional: Negative    Cardiovascular: Negative    Respiratory: Negative    Gastrointestinal: Negative   Genitourinary: As noted in HPI above   Musculoskeletal: As noted in HPI above   Skin: Negative    Neurological: Negative    Psychiatric: As noted in HPI above   Endocrine: Negative    Hematologic/Lymphatic: Negative   Allergic/Immunologic: Negative     Past Medical History:   Diagnosis Date   . Anxiety    . Chest pain    . Depression    . Disorder of menstrual bleeding    . Frequent use of laxatives    . Gastric ulcer    .  GERD (gastroesophageal reflux disease)    . Headache    . Heart burn    . Heart murmur    . History of irritable bowel syndrome    . Indigestion    . Nausea and vomiting    . Obesity    . Pap smear abnormality of cervix    . Vulvar vestibulitis 11/26/2014    [place] Dr [place]     Review of patient's allergies indicates:  Allergies   Allergen Reactions   . Prednisone Rash and Hives       Meds:   Outpatient Medications Prior to Visit   Medication Sig Dispense Refill   . Albuterol Sulfate HFA 108 (90 Base) MCG/ACT Inhalation Aero Soln Inhale 2 puffs by mouth every 4 hours as needed for shortness of breath/wheezing. Also may use 2 puffs 10 min pre exercise 1 Inhaler 5   . DULoxetine HCl 20 MG Oral CAPSULE ENTERIC COATED PARTICLES Take 1 capsule (20 mg) by mouth daily. Start after 1-2 months of 30 mg 30 capsule 1   . Etonogestrel (NEXPLANON SC)      . HydrOXYzine HCl 25 MG Oral Tab Take 1 tablet (25 mg) by mouth every 4 hours as needed. 120 tablet 2   . Ibuprofen 600 MG Oral Tab For pain/inflammation. Take with food. 100 tablet 2   . Magnesium Oxide 400 MG Oral Tab Take 2 tablets (800 mg) by mouth daily. 60 tablet 4   . Meth-Hyo-M Bl-Na  Phos-Ph Sal (URELLE) 81 MG Oral Tab Take 1 tablet by mouth 4 times a day. 120 tablet 5   . ROPINIRole HCl 0.25 MG Oral Tab 1 po qhs, may increase by 1 tab per dose after 2-3 days if not effective and tolerated, max dose 4 90 tablet 1   . Spacer/Aero-Holding Chambers (AEROCHAMBER MV) Misc Use. 1 each 0   . TraZODone HCl 50 MG Oral Tab Take 1-2 tablets (50-100 mg) by mouth at bedtime. for Insomnia. 60 tablet 5   . Triamcinolone Acetonide 0.1 % External Cream Apply 1 application to affected area on genitals at bedtime. Use pea sized amount on affected area 80 g 0     No facility-administered medications prior to visit.          Family History   Problem Relation Age of Onset   . Diabetes Mother        Social History     Social History   . Marital status: Married     Spouse name: N/A   . Number of children: N/A   . Years of education: N/A     Occupational History   . Not on file.     Social History Main Topics   . Smoking status: Former Smoker     Packs/day: 0.25     Years: 2.00     Types: Cigarettes   . Smokeless tobacco: Former NeurosurgeonUser     Quit date: 04/04/2015   . Alcohol use 0.0 oz/week      Comment: Rare  ~ 1 beer per mo   . Drug use: No   . Sexual activity: No      Comment: didn't tolerate Nexplanon after one year.      Other Topics Concern   . Not on file     Social History Narrative    Lives with boyfriend    965 yo terrier Lily    No kids       P.E:  BP 138/87  Pulse (!) 105   Temp 98.8 F (37.1 C) (Temporal)   Resp 14   Ht  (1.6 m)   Wt (!) 252 lb (114.3 kg)   SpO2 97%   BMI 44.64 kg/m   General appearance*: Color is normal, Weight appears heavy  for height.  and No acute distress  Affect*: Judgement/insight normal, Mood/affect normal, Orientation oriented to time, person and place and Recent/remote Memory normal  MS: Knees B w/o swelling, redness         Unable to fully extend L knee, pain under knee cap, + crepitance with knee flex/ext, Knee is stable to varus and valgus strain, strength  normal      A/P    (F32.0) Mild single current episode of major depressive disorder (HCC)  (primary encounter diagnosis)  Comment: doing well on 20 mg dose of Cymbalta  Plan: DULoxetine HCl 20 MG Oral CAPSULE ENTERIC         COATED PARTICLES        Reminded pt of importance of self care (eg adequate sleep hours, healthy diet, exercise and renewing activities).    

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
## 2015-12-25 NOTE — Progress Notes (Signed)
Reviewed eCare status with Patient:  YES    HEALTH MAINTENANCE:  Has the patient had any of these since their last visit?    Cervical screening/PAP: Not Due     Mammo: Not Due    Colon Screen: Not Due    Diabetic Eye Exam: Not Due      Have you seen a specialist since your last visit: No        HM Due:   Health Maintenance   Topic Date Due   . Influenza Vaccine (Season Ended) 04/01/2016   . Chlamydia Screen  10/22/2016   . Pap Smear  10/22/2016   . Gonorrhea Screen  10/22/2016   . Tetanus Vaccine  12/26/2022   . HIV Screen  Completed   . HPV Vaccine  Completed           Future Appointments  Date Time Provider Department Center   12/25/2015 12:45 PM Cecelia ByarsAnderson, Sandra Jean, MD [facility]   12/25/2015 3:40 PM Randolph BingWalton, Anna Lee, MD [facility]    02/05/2016 1:00 PM Neale BurlyEckert, Linda F, MD [facility]

## 2015-12-25 NOTE — Patient Instructions (Addendum)
It was a pleasure to see you in clinic today.                If you are not yet signed up for eCare, please see the below eCare section at the end of this document for how to enroll and to get your access codes.  It's easy to sign up at home today.    eCare  enrollment will allow you access to the below benefits   You can make appointments online   View test results / Lab Results   Request prescription renewals   Obtain a copy of our After Visit Summary (an electronic copy of this document)     Your Test Results:  If labs were ordered today the results are expected to be available via eCare in about 5 days. If you have an active eCare account, this is how we will notify you of your results.     If you do not have an eCare account then your test results will be mailed to you within about 14 days after your tests are completed. If your physician needs to change your care based on your results or is concerned, you should expect a phone call or eCare message from your provider.    If you have any questions about your test results please schedule an appointment with your provider, so that we may review them with you in greater detail.    **If it has been more than 2 weeks and you have not received your test results please send our office a message via eCare.    Medication Refills: If you need a prescription refilled, please contact your pharmacy 1 week before your current supply will run out to request the refill.  Contacting your pharmacy is the fastest and safest way to obtain a medication refill.  The pharmacy will notify our office.  Please note, that a minimum of 48 to 72 hours is needed to refill a medication,  Please call your pharmacy early to allow enough time to refill before you anticipate running out.  For faster medication refills, you can also schedule an appointment with your provider.    We know you have a choice in where you receive your healthcare and we sincerely thank you for trusting [facility] with your health.    Check with OB/Gyn re spotting ? Use Minipill or other  ? Labs    Can do TSH to check Thyroid + any labs per Gyn at once    Continue current regimen for meds  Remember the importance of self care (eg adequate sleep hours, healthy diet, exercise and renewing activities).    Re check in 3 mo

## 2015-12-28 NOTE — Progress Notes (Signed)
I saw and evaluated the patient with Dr. Walton. I have reviewed the resident's documentation and agree with it. Zariel Capano, MD

## 2015-12-29 NOTE — Progress Notes (Signed)
[facility] PROGRESS NOTE     VISIT INFORMATION   Location:  [facility]    Duration of session:  60 min    SUBJECTIVE   CC/Reason for referral:  Hx of SA  Relevant interval history:  n/a  Primary clinical model:  CETA    Homework/compliance with plan:  Pt reports that she completed BA.  Pt reviewed HW in session with pt and problem solved barriers pt identified for some of the BA activities pt chose.   Current symptoms/complaints:  No new symptoms reported   Primary Clinical Targets:  PTS    Standardized Measure Scores:  n/a  Current session content:  Coping Skills    Brief description of interventions:  This therapist continued work with pt re: CBT triangle.  This therapist discussed cognitive coping with pt.  This therapist continued to define what a thought and feeling is with pt.     OBJECTIVE   Patient presentation:  appropriate    ASSESSMENT/DIAGNOSIS   Primary diagnostic category: trauma and stressor-related disorders    Secondary diagnostic category: n/a    Specific diagnosis and ICD-10 code:    (F43.10) PTSD (post-traumatic stress disorder)  (primary encounter diagnosis)      Update on progress:  Pt continues to appear motivated.  Pt has completed HW and practiced skills from session.     PLAN   Next session and plan:  5/19  Assigned homework:  CBT triangle worksheets  Daily transaction log completed:  No  Further recommendations/consultations:  No    NOTES   none

## 2015-12-31 ENCOUNTER — Telehealth (HOSPITAL_BASED_OUTPATIENT_CLINIC_OR_DEPARTMENT_OTHER): Payer: Self-pay

## 2015-12-31 NOTE — Telephone Encounter (Signed)
Patient says she is under the weather and cannot make her appointment 01/01/16.

## 2016-01-01 ENCOUNTER — Encounter (HOSPITAL_BASED_OUTPATIENT_CLINIC_OR_DEPARTMENT_OTHER): Payer: No Typology Code available for payment source

## 2016-01-04 ENCOUNTER — Encounter (INDEPENDENT_AMBULATORY_CARE_PROVIDER_SITE_OTHER): Payer: No Typology Code available for payment source | Admitting: Family Medicine

## 2016-01-04 ENCOUNTER — Ambulatory Visit
Payer: No Typology Code available for payment source | Attending: Family Medicine | Admitting: Rehabilitative and Restorative Service Providers"

## 2016-01-04 DIAGNOSIS — M25562 Pain in left knee: Secondary | ICD-10-CM | POA: Insufficient documentation

## 2016-01-04 DIAGNOSIS — G8929 Other chronic pain: Secondary | ICD-10-CM | POA: Insufficient documentation

## 2016-01-04 NOTE — Progress Notes (Signed)
Physical Therapy Evaluation     This note serves as a CMS evaluation form that requires the referring provider to certify the need for therapy services furnished under this Plan of Treatment. The signing provider is certifying the plan of care.    The following patient identifiers were confirmed today: name and date of birth.     Referring provider: Cecelia ByarsSandra Jean Anderson    Referring provider NPI: [id number]    Treatment/Therapy diagnosis:   [phone](M25.562) Acute pain of left knee  (primary encounter diagnosis)  (M25.562,  G89.29) Chronic pain of left knee    Order/Reason for referral: Evaluate and treat  Visit count: 1  G-Code count: 1    Subjective:   The patient is a 25 year old female presenting with (M25.562) Acute pain of left knee  (primary encounter diagnosis)  (M25.562,  G89.29) Chronic pain of left knee. She has had knee pain off/on for a number of years (no known injury), and has experienced a flare up of her familiar knee pain about 3 weeks ago. She denies an injury or change in physical activity levels, and is unsure of what may have caused this flare up. She does not exercise beyond some walking. She does report that the knee pain has almost made her leg give out on her, that she has to limp occasionally, that there is a feeling of some instability, and that is pops painfully occasionally. She has never been to physical therapy for her knee. Due to decreased mobility and pain, the patient consulted with Cecelia ByarsSandra Jean Anderson who referred patient to physical therapy for evaluation and treatment.    General Assessment  Date of Symptom Onset :  (approx 3 weeks ago)  Start of Care Date: 01/04/16  Reason for Referral: L knee pain  Best Level of Pain: 3 (achy)  Worst Level of Pain: 7 (sharp, burning)  Aggravating Factors: walking medium speed 5-10 minutes, going down stairs > going up stairs, sitting with legs crossed  Relieving Factors: rest, ice, naproxen  Interpreter Status: Not Needed    Prior Level of  Function  ADLs: Independent with all ADLs  IADLs: Independent with all ADLs  Work/Education Life: Independent with all ADLs  Community/Leisure Activities: Independent with all ADLs (walked 1-2 miles/day)    Education  Engineer, productionrimary Learner: Patient  Preferred Language: English  Education Preference: Demonstration;Pictures/Handout  Method of Education: Handout;Demonstration;Previous Exercises Provided  Response to Education: Demonstrates independently  Barriers to Education: None  Cultural practices that influence care: None    Therapy Goals  Patient Goals: reduce pain to improve walking distance for exercise, stair navigation (especially to enter RV home), and pick up items/squat with less pain  Potential Barriers to Achieving Goals: Chronicity or Severity;Comorbidities     ADLs: Independent with all ADLs  IADLs: Independent with all ADLs  Work/Education Life: Independent with all ADLs  Community/Leisure Activities: Independent with all ADLs (walked 1-2 miles/day)     Past medical history:   Past Medical History:   Diagnosis Date   . Anxiety    . Chest pain    . Depression    . Disorder of menstrual bleeding    . Frequent use of laxatives    . Gastric ulcer    . GERD (gastroesophageal reflux disease)    . Headache    . Heart burn    . Heart murmur    . History of irritable bowel syndrome    . Indigestion    . Nausea and vomiting    .  Obesity    . Pap smear abnormality of cervix    . 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 CARPAL TUNNEL RELEASE     . COLONOSCOPY     . ESOPHAGOGASTRODUODENOSCOPY         Objective:  Patient consent: yes  Chaperone: no  Observation: oriented x4 and normal affect, no anxiety  Lower extremity structure:    -genu valgus and recurvatum   -calcaneal valgus  Gait analysis:    -antalgic   -short stance time on affected side   -trendelenberg   -knee hyperextension during stance   -excessive  toe out   -wide base of support     Knee Passive Range of Motion:      Right side Left side   Knee Flexion Within functional limits  Within functional limits    Knee Extension Hyper  hyper     Knee Active Range of Motion:      Right side Left side   Knee Flexion Within functional limits  Within functional limits    Knee Extension Hyper  hyper     Manual Muscle Test Knee and Hip:      Right side Left side   Knee Flexion Did not test  Did not test    Knee Extension Strong and pain free Strong and pain free           Right side Left side   Hip Flexion 4/5 4/5   Hip Extension 4/5 4/5   Hip Abduction 4/5 4/5   Hip Adduction Did not test  Did not test    Hip External Rotation 5-/5 5-/5   Hip Internal Rotation Did not test  Did not test             Special tests: Single Leg Balance: weight shifts to ipsilateral side, trunk and arm strategy used, knee hyperextends  Squat - 2 Leg: decreased depth, dynamic valgus, foot pronates and externally rotates  Flight of Stairs: fair control  Palpation: Increased tone with pain to palpation of peripatellar connective tissue, quad and patellar tendons, adductor insertion.  Active straight leg raise: no extension lag, is shaky    Interventions/Treatment provided:      Neuromuscular re-education:     Access Code: [email or web address]   URL: [email or web address]   Date: 01/04/2016  Prepared by: Larkin Ina    Program Notes    Try to minimize knee hyperextension with walking, exercise, etc.    Exercises   Sit to Stand with Resistance Around Legs - 3 sets - 5x weekly   Supine Bridge with Resistance Band - 3 sets - 5x weekly   Small Range Straight Leg Raise - 3 sets - 5x weekly    Home exercise instruction: verbal instructions and demonstrations were provided for the above exercises  a handout was provided describing the exercises in written and picture format  the patient was able to return demonstrate the exercises accurately after instructions    ASSESSMENT: Rehabilitation  potential is: Good. 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 LE alignment, muscle weakness, and motor control deficits liking contributing, as does patient's weight. Symptoms are moderate in severity, moderate in irritability, musculoskeletal in nature, subacute on chronic, and evolving. No red flags.    Lower extremity functional score (LEFS): 55 (0-80 scale, lower scores indicate higher disability, minimum  clinically important difference = 9 points)    Goals  Short-term goals:            1. Patient will be ascend the stairs into her home with mild-no pain in 4 weeks.             2. Patient will be able to walk for 10 minutes with mild-no pain and no subjective reports of her knee giving way in 5 weeks.    Long-term functional goals:    1. Patient will report 2 point reduction in worst pain score as measured on NPRS in 8 weeks.             2. Patient will report 9 point improvement or greater in LEFS score in 8 weeks.            3. Patient will return to her PRIOR LEVEL OF FUNCTION with regards to stairs and walking distances in 10 weeks.    These goals were discussed and the patient agrees with them. and the patient actively participated in developing them.     PLAN: work on LE motor control to decrease stress at PATELLOFEMORAL JOINT, including hip strength and control, trunk strength and control, and ankle/foot control. Cue to minimize time spent in hyperextension of knee. Progress home program to include more weightbearing and eventually single leg strengthening as symptoms allow.    Planned Intervention(s): Patient Education;Manual Therapy;Therapeutic Excerise;Therapeutic Activities;Neuromuscular Re-education;Gait Training;Self-care / Home Management;Physical Agents;Electrical Stimulation     Prognosis: Good  Therapy Frequency: 1/wk (with decreased frequency as able), 2-3 months    This patient will be seen by a physical therapist or a physical therapist assistant  following this plan of care.    Trudie Reed, PT  P: [phone]  F: [phone]  [email or web address]

## 2016-01-05 ENCOUNTER — Ambulatory Visit (HOSPITAL_BASED_OUTPATIENT_CLINIC_OR_DEPARTMENT_OTHER): Payer: No Typology Code available for payment source | Admitting: Obstetrics/Gynecology

## 2016-01-05 ENCOUNTER — Ambulatory Visit
Admission: RE | Admit: 2016-01-05 | Discharge: 2016-01-05 | Disposition: A | Discharge status: Home / Self Care | Payer: No Typology Code available for payment source | Attending: Obstetrics/Gynecology | Admitting: Obstetrics/Gynecology

## 2016-01-05 DIAGNOSIS — Z6841 Body Mass Index (BMI) 40.0 and over, adult: Secondary | ICD-10-CM | POA: Insufficient documentation

## 2016-01-05 DIAGNOSIS — N94810 Vulvar vestibulitis: Secondary | ICD-10-CM

## 2016-01-05 DIAGNOSIS — R011 Cardiac murmur, unspecified: Secondary | ICD-10-CM | POA: Insufficient documentation

## 2016-01-05 DIAGNOSIS — M62838 Other muscle spasm: Secondary | ICD-10-CM

## 2016-01-05 DIAGNOSIS — F4310 Post-traumatic stress disorder, unspecified: Secondary | ICD-10-CM | POA: Insufficient documentation

## 2016-01-05 DIAGNOSIS — E282 Polycystic ovarian syndrome: Secondary | ICD-10-CM | POA: Insufficient documentation

## 2016-01-05 DIAGNOSIS — Z87891 Personal history of nicotine dependence: Secondary | ICD-10-CM | POA: Insufficient documentation

## 2016-01-05 DIAGNOSIS — F3289 Other specified depressive episodes: Secondary | ICD-10-CM | POA: Insufficient documentation

## 2016-01-05 LAB — GLUCOSE POC, ~~LOC~~: Glucose (POC): 92 mg/dL (ref 62–125)

## 2016-01-08 ENCOUNTER — Encounter (HOSPITAL_BASED_OUTPATIENT_CLINIC_OR_DEPARTMENT_OTHER): Payer: Self-pay | Admitting: Obstetrics & Gynecology

## 2016-01-08 ENCOUNTER — Ambulatory Visit (HOSPITAL_BASED_OUTPATIENT_CLINIC_OR_DEPARTMENT_OTHER)
Payer: No Typology Code available for payment source | Attending: Obstetrics & Gynecology | Admitting: Obstetrics & Gynecology

## 2016-01-08 VITALS — BP 135/93 | HR 118 | Temp 99.1°F | Ht 63.0 in | Wt 246.0 lb

## 2016-01-08 DIAGNOSIS — R300 Dysuria: Secondary | ICD-10-CM | POA: Insufficient documentation

## 2016-01-08 LAB — URINALYSIS WITH REFLEX CULTURE
Bilirubin (Qual), URN: NEGATIVE
Epith Cells_Renal/Trans,URN: NEGATIVE /HPF
Epith Cells_Squamous, URN: NEGATIVE /LPF
Glucose Qual, URN: NEGATIVE mg/dL
Leukocyte Esterase, URN: NEGATIVE
Nitrite, URN: NEGATIVE
RBC, URN: NEGATIVE /HPF
Specific Gravity, URN: 1.02 g/mL (ref 1.002–1.027)
WBC, URN: NEGATIVE /HPF
pH, URN: 5.5 (ref 5.0–8.0)

## 2016-01-08 LAB — PR U/A NONAUTO DIPSTICK ONLY, ONSITE
Glucose, Urine: NEGATIVE mg/dL
Ketones, URN: 80 mg/dL
Leukocytes: NEGATIVE
Nitrite, URN: NEGATIVE
Urobilinogen, URN: 0.2 E.U./dL (ref 0.2–1.0)
pH, URN (UWNC): 6 (ref 5.0–8.0)

## 2016-01-08 LAB — PR WET MOUNTS INCL PREP VAGINAL CERV/SKIN SPECIMENS, ONSITE
Clue Cells: 0
Odor: NEGATIVE
PH, Wet Mount: 4 (ref 4.0–7.0)
Trich: 0
WBC, KOH Prep (UWNC): 0
Yeast, URN: 0

## 2016-01-08 LAB — REFLEX CULTURE FOR UA

## 2016-01-09 ENCOUNTER — Encounter (INDEPENDENT_AMBULATORY_CARE_PROVIDER_SITE_OTHER): Payer: No Typology Code available for payment source | Admitting: Family Medicine

## 2016-01-10 LAB — URINE C/S: Culture: NO GROWTH
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
## 2016-01-10 NOTE — Patient Instructions (Signed)
Thank you for visiting the [facility].   If you have any questions about your visit please call the triage nurse at [phone].     Dietary Irritants to the Bladder  What is a bladder irritant?    A  bladder  irritant  is  any  food,  drink,  or  medication  that  causes  the  bladder  to  be  irritated.  Irritation can cause frequency (needing to urinate more often than normal), urgency (the sense of needing  to  urinate),  bladder  spasms,  and  even  bladder  pain.    Bladder  spasms  can  lead  to  urine leakage if there is a sudden urge, but not enough time to reach a toilet.    What are some examples of bladder irritants?  The following is a list of bladder irritants. The seven MOST IRRITATING are listed first:  *All alcoholic beverages  *Cigarettes/Tobacco  *Sodas  *Tea  *Artificial Sweeteners  *Chocolate  *Coffee  Other possible irritants include:  Fruits (and their juices): cranberries, grapes, oranges, lemons,   peaches, pineapple, plums,  apples, and cantaloupe  Vegetables: onions, tomatoes, chilies, peppers  Milk/Dairy: aged cheese, sour cream, yogurt  Grains: rye & sourdough breads  Seasonings: spices & spicy food, especially peppers, acidic foods and beverages, walnuts & peanuts, vinegar    How do I change my diet?  You should start by  eliminating, or at least cutting down, on the top seven  irritants: coffee, tea, sodas,  chocolate,  alcohol, artificial  sweeteners   and  cigarettes.    You  should  allow  at  least two  weeks  without  the  food  or  drink  before  noticing  any  change  in  your  bladder  control.    The proof that the food or drink is causing irritation is frequency and urgency that returns when the food or drink is restarted.    Can I substitute any foods?  Yes, there are substitutes readily available.  Suggested substitutes include:  Herbal tea without citrus, weakly brewed tea  Melons (except cantaloupe)  White chocolate  Pine nuts, almonds, or cashews  Caffeine-Free,      Herbal coffee & tea

## 2016-01-11 ENCOUNTER — Ambulatory Visit: Payer: No Typology Code available for payment source | Admitting: Obstetrics/Gynecology

## 2016-01-11 ENCOUNTER — Encounter (INDEPENDENT_AMBULATORY_CARE_PROVIDER_SITE_OTHER): Payer: Self-pay | Admitting: Obstetrics/Gynecology

## 2016-01-11 VITALS — BP 141/90 | HR 97 | Wt 252.4 lb

## 2016-01-11 DIAGNOSIS — R3911 Hesitancy of micturition: Secondary | ICD-10-CM

## 2016-01-11 DIAGNOSIS — R300 Dysuria: Secondary | ICD-10-CM

## 2016-01-11 DIAGNOSIS — R102 Pelvic and perineal pain: Secondary | ICD-10-CM

## 2016-01-11 DIAGNOSIS — R339 Retention of urine, unspecified: Secondary | ICD-10-CM

## 2016-01-11 LAB — PR MEAS POST-VOIDING RESIDUAL URINE&/BLADDER CAP: Volume, URN: 0

## 2016-01-11 NOTE — Patient Instructions (Signed)
Work with pelvic floor PT.    Follow up with Dr. Dolphus JennyEckert.    Avoid bladder irritants - try stopping caffeine for a week or two.    Consider cystoscopy, including cystoscopy with hydrodistension, in the future.

## 2016-01-11 NOTE — Progress Notes (Signed)
[facility]  Chief Complaint   Patient presents with   . Bladder Problems       History:  25 year old female presents today for evaluation of ongoing pelvic pain.  She underwent pelvic floor and posterior introitus injections with marcaine/kenalog and kenalog, respectively.  Went to gyn clinic Friday and had unremarkable pelvic exam and negative UA (no nitrites, leukocytes, or RBCs).      Since the injections 6/6, she has had a large decrease in vaginal spasms and a little improvement in posterior introitus pain, but she has had increased burning with urination, urinary hesitancy, and urinary incontinence.  Feels pain when her bladder fills, and for the past two days has felt left flank pain after voiding.  Never had this pain before.  Tried Urelle for burning, but it made it 10 times worse.      Exam:    Vitals: BP 141/90   Pulse 97   Wt (!) 252 lb 6.4 oz (114.5 kg)   LMP 12/05/2015   SpO2 99%   BMI 44.71 kg/m   Neuro/psych:  Alert and oriented x 3 in a pleasant mood  Skin: Warm, dry and intact    Procedures:    PVR of 0 mL obtained by bladder scan  Urine 5/9 reviewed, not repeated today.    Assessment:  (R30.0) Dysuria  (primary encounter diagnosis) - no microscopic hematuria and no bacterial infection.  I don't think there is an indication for cystoscopy or other urinary tract evaluation based on her symptoms or lab findings.  It is unlikely that ureteral reflux is causing left flank pain for the past two days since she has never had this problem before and is voiding adequately without obstruction.  No UTI to cause pyelonephritis.      Plan:   Work with pelvic floor PT again.    Follow up with Dr. Dolphus JennyEckert.    Avoid bladder irritants - try stopping caffeine for a week or two.    Consider cystoscopy, including cystoscopy with hydrodistension, in the future.    Consider urology consult or urodynamics to evaluate for ureteral efflux if symptoms persist.    I do not recommend more pelvic floor injections at  this point since the last two injections exacerbated her pelvic problems as much as they helped.      I spent a total time of 25 minutes face-to-face with the patient, of which more than 50% was spent counseling about possible etiologies of her pain (bladder vs pelvic floor spasm vs both), history of trauma contributing (working with counselor), and the fact that she has real pelvic disease but that how her brain processes the signals centrally are also very important and should be addressed.

## 2016-01-11 NOTE — Progress Notes (Signed)
I saw and evaluated the patient. I have reviewed the resident's documentation and agree with it.  Salwa Bai, MD

## 2016-01-13 ENCOUNTER — Ambulatory Visit (HOSPITAL_BASED_OUTPATIENT_CLINIC_OR_DEPARTMENT_OTHER): Payer: No Typology Code available for payment source | Admitting: Rehabilitative and Restorative Service Providers"

## 2016-01-13 DIAGNOSIS — M25562 Pain in left knee: Secondary | ICD-10-CM

## 2016-01-13 DIAGNOSIS — G8929 Other chronic pain: Secondary | ICD-10-CM

## 2016-01-13 NOTE — Progress Notes (Signed)
Physical Therapy Daily Note     The following identifiers were confirmed with the patient today: name and date of birth  Referring provider: Cecelia ByarsSandra Jean Salinas    Referring provider NPI: [id number]     Visit count: 2    Subjective/Pain:   The patient is reporting 0/10 level of pain, on a 0-10 Numeric Pain Distress Scale, and reports low compliance with home exercise program. States she had an episode of severe depression this past week, and didn't do her home program during this episode. I asked if she has the support she needs to manage this episode, and she stated yes. She was able to do the home program yesterday, no pain, felt easy.    Objective:  Skilled services/interventions provided:  Performed HEP to review mechanics and monitor for compensations.    Neuromuscular Re-education:  -Tandem balance on firm ground  -NBOS stance on air ex  -Marching onto air ex  -TERMINAL KNEE EXTENSION blue theraband   -NBOS on air ex, eyes closed   -Bridge with band  -Bridge with kick    Therapeutic Exercise:  -Straight leg raise  -Step up and back  -Standing hip abduction    -all balance exercises performed for 30" bilaterally, and all strengthening performed for 10 reps bilaterally    Assessment:  Patient's response to therapy: appropriate fatigue.   Change in impairments: none currently, though this is not unexpected given short duration of this care episode and longer time required to improve motor control and strength.    Patient education/instruction: verbal instructions and demonstrations were provided for the above exercises  a handout was provided describing the exercises in written and picture format  the exercises that were provided last session were reviewed with the patient today  the patient was able to return demonstrate the exercises accurately after instructions    HOME EXERCISE PROGRAM:    Access Code: [email or web address]   URL: [email or web address]   Date: 01/04/2016  Prepared by: Larkin InaNatasha Tove Wideman    Program  Notes    Try to minimize knee hyperextension with walking, exercise, etc.    Exercises  -Standing Hip Abduction - 3 sets - 4x weekly  -Step Up - 3 sets - 4x weekly  -Supine Bridge with Leg Extension - 3 sets - 4x weekly    Functional limitation(s) remaining requiring continued skilled services: stair navigation, walking duration    GOALS:    Short-term goals:                               1. Patient will be ascend the stairs into her home with mild-no pain in 4 weeks.                                2. Patient will be able to walk for 10 minutes with mild-no pain and no subjective reports of her knee giving way in 5 weeks.    Long-term functional goals:                    1. Patient will report 2 point reduction in worst pain score as measured on NPRS in 8 weeks.                                2. Patient will report 9 point improvement or  greater in LEFS score in 8 weeks.                               3. Patient will return to her PRIOR LEVEL OF FUNCTION with regards to stairs and walking distances in 10 weeks.    Plan: work on LE motor control to decrease stress at PATELLOFEMORAL JOINT, including hip strength and control, trunk strength and control, and ankle/foot control. Cue to minimize time spent in hyperextension of knee. Progress home program to include more weightbearing and eventually single leg strengthening as symptoms allow.    Planned Intervention(s): Patient Education;Manual Therapy;Therapeutic Excerise;Therapeutic Activities;Neuromuscular Re-education;Gait Training;Self-care / Home Management;Physical Agents;Electrical Stimulation     Prognosis: Good  Therapy Frequency: 1/wk (with decreased frequency as able), 2-3 months    Trudie Reed, PT

## 2016-01-15 ENCOUNTER — Ambulatory Visit (HOSPITAL_BASED_OUTPATIENT_CLINIC_OR_DEPARTMENT_OTHER): Payer: No Typology Code available for payment source

## 2016-01-15 DIAGNOSIS — F4310 Post-traumatic stress disorder, unspecified: Secondary | ICD-10-CM

## 2016-01-15 NOTE — Progress Notes (Signed)
[facility] PROGRESS NOTE     VISIT INFORMATION   Location:  [facility]    Duration of session:  60 min    SUBJECTIVE   CC/Reason for referral:  Hx of SA  Relevant interval history:  n/a  Primary clinical model:  CETA    Homework/compliance with plan:  Yes- pt completed BA  Current symptoms/complaints:  Depression, anxiety, isolation   Primary Clinical Targets:  Depression    Standardized Measure Scores:  n/a  Current session content:  Behavioral Activation    Brief description of interventions:  This therapist continued work re: BA.  This therapist reviewed HW and discussed what worked and addressed barriers.  This therapist continued work with the CBT triangle with pt.     OBJECTIVE   Patient presentation:  appropriate    ASSESSMENT/DIAGNOSIS   Primary diagnostic category: trauma and stressor-related disorders    Secondary diagnostic category: n/a    Specific diagnosis and ICD-10 code:    (F43.10) PTSD (post-traumatic stress disorder)  (primary encounter diagnosis)      Update on progress:  Pt remains engaged and motivated.  Pt states that she is trying to incorporate coping tools daily.     PLAN   Next session and plan:  6/2  Assigned homework:  BA  Daily transaction log completed:  No  Further recommendations/consultations:  No    NOTES   none

## 2016-01-20 ENCOUNTER — Encounter (HOSPITAL_BASED_OUTPATIENT_CLINIC_OR_DEPARTMENT_OTHER): Payer: Self-pay

## 2016-01-20 ENCOUNTER — Ambulatory Visit (INDEPENDENT_AMBULATORY_CARE_PROVIDER_SITE_OTHER): Payer: Self-pay | Admitting: Family Medicine

## 2016-01-20 ENCOUNTER — Encounter (HOSPITAL_BASED_OUTPATIENT_CLINIC_OR_DEPARTMENT_OTHER): Payer: Self-pay | Admitting: Rehabilitative and Restorative Service Providers"

## 2016-01-21 ENCOUNTER — Ambulatory Visit (HOSPITAL_BASED_OUTPATIENT_CLINIC_OR_DEPARTMENT_OTHER): Payer: No Typology Code available for payment source | Admitting: Rehabilitative and Restorative Service Providers"

## 2016-01-21 DIAGNOSIS — G8929 Other chronic pain: Secondary | ICD-10-CM

## 2016-01-21 DIAGNOSIS — M25562 Pain in left knee: Secondary | ICD-10-CM

## 2016-01-21 NOTE — Progress Notes (Signed)
Physical Therapy Daily Note     The following identifiers were confirmed with the patient today: name and date of birth  Referring provider: Cecelia ByarsSandra Jean Anderson    Referring provider NPI: [id number]     Visit count: 3    Subjective/Pain:   The patient is reporting Does not report/10 level of pain, on a 0-10 Numeric Pain Distress Scale. Patient reports that she does have some pain today, not a lot but the same as she has had. Has noted less activity lately and has not done home exercises since she was in last. Wonders about doing some in her bed if that's ok, and what size step to use at home.     Objective:  Skilled services/interventions provided:  Performed HEP to review mechanics and monitor for compensations.    Neuromuscular Re-education:  -Tandem balance on airex  -NBOS stance on air ex head turns  -NBOS on air ex, eyes closed   -Bridge single leg  -Bridge with kick    Therapeutic Exercise:  -Straight leg raise  -Step up and back  -Standing hip abduction    -all balance exercises performed for 30" bilaterally, and all strengthening performed for 10 reps bilaterally    Assessment:  Patient's response to therapy: No familiar pain. Felt good with idea of using a checklist flow sheet to track when she does exercises.     Change in impairments: none currently, though this is not unexpected given short duration of this care episode and longer time required to improve motor control and strength.    Patient education/instruction: verbal instructions and demonstrations were provided for the above exercises  a handout was provided describing the exercises in written and picture format  the exercises that were provided last session were reviewed with the patient today  the patient was able to return demonstrate the exercises accurately after instructions    HOME EXERCISE PROGRAM:    Access Code: [email or web address]   URL: [email or web address]   Date: 01/21/2016  Prepared by: Richarda BladeAITLIN Lucy Woolever    Exercises - do 3 a day,  doesn't matter if first or second together, just what was printed together for check list.   Group 1   Step Up - 10 reps - 2 sets - 2 hold - 1x daily - 7x weekly   Standing Hip Abduction Kicks - 10 reps - 2 sets - 2 hold - 1x daily - 7x weekly   Marching Bridge - 10 reps - 2 sets - 2 hold - 1x daily - 7x weekly  Group 2   Single Leg Bridge - 10 reps - 2 sets - 2 hold - 1x daily - 7x weekly   Tandem Stance - 10 reps - 2 sets - 2 hold - 1x daily - 7x weekly   Single Leg Stance - 10 reps - 2 sets - 2 hold - 1x daily - 7x weekly      Functional limitation(s) remaining requiring continued skilled services: stair navigation, walking duration    GOALS:    Short-term goals:                               1. Patient will be ascend the stairs into her home with mild-no pain in 4 weeks.                                2. Patient  will be able to walk for 10 minutes with mild-no pain and no subjective reports of her knee giving way in 5 weeks.    Long-term functional goals:                    1. Patient will report 2 point reduction in worst pain score as measured on NPRS in 8 weeks.                                2. Patient will report 9 point improvement or greater in LEFS score in 8 weeks.                               3. Patient will return to her PRIOR LEVEL OF FUNCTION with regards to stairs and walking distances in 10 weeks.    Plan: work on LE motor control to decrease stress at PATELLOFEMORAL JOINT, including hip strength and control, trunk strength and control, and ankle/foot control. Cue to minimize time spent in hyperextension of knee. Progress home program to include more weightbearing and eventually single leg strengthening as symptoms allow.    Planned Intervention(s): Patient Education;Manual Therapy;Therapeutic Excerise;Therapeutic Activities;Neuromuscular Re-education;Gait Training;Self-care / Home Management;Physical Agents;Electrical Stimulation     Prognosis: Good  Therapy Frequency: 1/wk (with  decreased frequency as able), 2-3 months    Richarda Bladeaitlin Ariela Mochizuki, PTA

## 2016-01-22 ENCOUNTER — Ambulatory Visit (HOSPITAL_BASED_OUTPATIENT_CLINIC_OR_DEPARTMENT_OTHER): Payer: No Typology Code available for payment source

## 2016-01-22 DIAGNOSIS — F4310 Post-traumatic stress disorder, unspecified: Secondary | ICD-10-CM

## 2016-01-27 ENCOUNTER — Ambulatory Visit (HOSPITAL_BASED_OUTPATIENT_CLINIC_OR_DEPARTMENT_OTHER): Payer: No Typology Code available for payment source | Admitting: Rehabilitative and Restorative Service Providers"

## 2016-01-27 DIAGNOSIS — G8929 Other chronic pain: Secondary | ICD-10-CM

## 2016-01-27 DIAGNOSIS — M25562 Pain in left knee: Secondary | ICD-10-CM

## 2016-01-27 NOTE — Progress Notes (Signed)
[facility] PROGRESS NOTE     VISIT INFORMATION   Location:  [facility]    Duration of session:  60 min    SUBJECTIVE   CC/Reason for referral:  Hx of SA  Relevant interval history: Pt reports that she has been struggling with pain related to her bladder.  Pt reports that she has been addressing bladder pain with medical provider and getting treatment.   Primary clinical model:  CPT    Homework/compliance with plan:  Yes- pt completed BA  Current symptoms/complaints:  Pt reports that she has noticed an increase of sadness due to her bladder pain and treatment. Pt reports that her anti-depressant has been reduced and that may also be causing her an increase of depression.   Primary Clinical Targets:  PTS and Depression    Standardized Measure Scores: PTS, depression, and anxiety in clinical range   Current session content:  Coping Skills and Behavioral Activation    Brief description of interventions:  This therapist reviewed coping tools and BA with pt.  This therapist rviewed BA HW with pt and discussed ways to implement BA when depression is very high.  Depression has significantly increased over the last week even with pt using BA>  Pt does report that medical procedure and bladder pain may have something to do with such a big increase in depression.     OBJECTIVE   Patient presentation:  appropriate    ASSESSMENT/DIAGNOSIS   Primary diagnostic category: trauma and stressor-related disorders    Secondary diagnostic category: depressive disorders    Specific diagnosis and ICD-10 code:    (F43.10) PTSD (post-traumatic stress disorder)  (primary encounter diagnosis)      Update on progress:  Pt is engaged.  Pt continues to use her skills even though addressing medical issues.     PLAN   Next session and plan:  6/23  Assigned homework:  coping  Daily transaction log completed:  No  Further recommendations/consultations:  No    NOTES   none

## 2016-01-27 NOTE — Progress Notes (Signed)
Physical Therapy Daily Note     The following identifiers were confirmed with the patient today: name and date of birth  Referring provider: Cecelia ByarsSandra Jean Anderson    Referring provider NPI: [id number]     Visit count: 4    Subjective/Pain:   The patient is reporting Does not report/10 level of pain, on a 0-10 Numeric Pain Distress Scale. Able to get exercises in 3 times this past week and does believe that the flow sheet checklist did help with this. Had one day of notable pain with walking in a store this week otherwise feeling a bit more improved.     Objective:  Skilled services/interventions provided:  Performed HEP to review mechanics and monitor for compensations.    Neuromuscular Re-education:  -Tandem balance on 1/2 foam  -Neutral stance on half foam 2 x 1'  -Single leg 4 way reach with knee unlocked weight into heel for gluteal activation    Therapeutic Exercise:  -Lateral band walk with yellow band  -Standing hip abduction with yellow band  -Standing hip extension with yellow band    Assessment:  Patient's response to therapy: Good control with single leg exercises. Had some pain at end of treatment with focus of more dynamic load on quads and gluteals.    Change in impairments: Less occurrence of pain.    Patient education/instruction: verbal instructions and demonstrations were provided for the above exercises  a handout was provided describing the exercises in written and picture format  the exercises that were provided last session were reviewed with the patient today  the patient was able to return demonstrate the exercises accurately after instructions    HOME EXERCISE PROGRAM:    Access Code: [email or web address]   URL: [email or web address]   Date: 01/21/2016  Prepared by: Richarda BladeAITLIN Ciria Bernardini    Exercises - do 3 a day, doesn't matter if first or second together, just what was printed together for check list.   Group 1   Step Up - 10 reps - 2 sets - 2 hold - 1x daily - 7x weekly   Standing Hip Abduction  Kicks - 10 reps - 2 sets - 2 hold - 1x daily - 7x weekly   Marching Bridge - 10 reps - 2 sets - 2 hold - 1x daily - 7x weekly  Group 2   Single Leg Bridge - 10 reps - 2 sets - 2 hold - 1x daily - 7x weekly   Tandem Stance - 10 reps - 2 sets - 2 hold - 1x daily - 7x weekly   Single Leg Stance - 10 reps - 2 sets - 2 hold - 1x daily - 7x weekly      Functional limitation(s) remaining requiring continued skilled services: stair navigation, walking duration    GOALS:    Short-term goals:                               1. Patient will be ascend the stairs into her home with mild-no pain in 4 weeks.                                2. Patient will be able to walk for 10 minutes with mild-no pain and no subjective reports of her knee giving way in 5 weeks.    Long-term functional goals:  1. Patient will report 2 point reduction in worst pain score as measured on NPRS in 8 weeks.                                2. Patient will report 9 point improvement or greater in LEFS score in 8 weeks.                               3. Patient will return to her PRIOR LEVEL OF FUNCTION with regards to stairs and walking distances in 10 weeks.    Plan: work on LE motor control to decrease stress at PATELLOFEMORAL JOINT, including hip strength and control, trunk strength and control, and ankle/foot control. Cue to minimize time spent in hyperextension of knee. Progress home program to include more weightbearing and eventually single leg strengthening as symptoms allow.    Planned Intervention(s): Patient Education;Manual Therapy;Therapeutic Excerise;Therapeutic Activities;Neuromuscular Re-education;Gait Training;Self-care / Home Management;Physical Agents;Electrical Stimulation     Prognosis: Good  Therapy Frequency: 1/wk (with decreased frequency as able), 2-3 months    Richarda Bladeaitlin Rajiv Parlato, PTA

## 2016-01-29 ENCOUNTER — Ambulatory Visit (HOSPITAL_BASED_OUTPATIENT_CLINIC_OR_DEPARTMENT_OTHER): Payer: No Typology Code available for payment source

## 2016-02-03 ENCOUNTER — Encounter (HOSPITAL_BASED_OUTPATIENT_CLINIC_OR_DEPARTMENT_OTHER): Payer: Self-pay | Admitting: Rehabilitative and Restorative Service Providers"

## 2016-02-04 NOTE — Progress Notes (Signed)
[facility] PROGRESS NOTE     VISIT INFORMATION   Location:  [facility]    Duration of session:  60 min    SUBJECTIVE   CC/Reason for referral:  Hx of SA  Relevant interval history:  Pt reports that she is feeling less pain since procedure on her bladder.  Pt also reports that she thinks she has felt some improvements in her mood.   Primary clinical model:  CPT    Homework/compliance with plan:  Yes- BA  Current symptoms/complaints:  No new symptoms reported   Primary Clinical Targets:  PTS    Standardized Measure Scores:  Depression in normal range/ PTS and anxiety in clinical range  Current session content:  Psycho-education and Cognitive Restructuring    Brief description of interventions:  This therapist completed psycho ed re: impact of trauma and SA.  This therapist discussed pt personal impact re: SA.  This therapist discussed cognitive coping re: maladaptive cognitions pt is reporting.     OBJECTIVE   Patient presentation:  appropriate    ASSESSMENT/DIAGNOSIS   Primary diagnostic category: trauma and stressor-related disorders    Secondary diagnostic category: n/a    Specific diagnosis and ICD-10 code:    (F43.10) PTSD (post-traumatic stress disorder)  (primary encounter diagnosis)      Update on progress:  Pt remains engaged.  Pt states that she believes BA is helping depression.     PLAN   Next session and plan:  6/30  Assigned homework:  Cognitive coping   Daily transaction log completed:  No  Further recommendations/consultations:  No    NOTES   none

## 2016-02-05 ENCOUNTER — Encounter (HOSPITAL_BASED_OUTPATIENT_CLINIC_OR_DEPARTMENT_OTHER): Payer: No Typology Code available for payment source | Admitting: Obstetrics/Gynecology

## 2016-02-10 ENCOUNTER — Encounter (HOSPITAL_BASED_OUTPATIENT_CLINIC_OR_DEPARTMENT_OTHER): Payer: Self-pay | Admitting: Rehabilitative and Restorative Service Providers"

## 2016-02-12 ENCOUNTER — Ambulatory Visit (HOSPITAL_BASED_OUTPATIENT_CLINIC_OR_DEPARTMENT_OTHER): Payer: No Typology Code available for payment source | Admitting: Obstetrics/Gynecology

## 2016-02-12 ENCOUNTER — Ambulatory Visit (HOSPITAL_BASED_OUTPATIENT_CLINIC_OR_DEPARTMENT_OTHER): Payer: No Typology Code available for payment source | Attending: Obstetrics/Gynecology

## 2016-02-12 VITALS — BP 139/82 | HR 81 | Temp 97.9°F | Ht 63.0 in | Wt 255.0 lb

## 2016-02-12 DIAGNOSIS — N94810 Vulvar vestibulitis: Secondary | ICD-10-CM

## 2016-02-12 DIAGNOSIS — F4310 Post-traumatic stress disorder, unspecified: Secondary | ICD-10-CM

## 2016-02-12 DIAGNOSIS — B373 Candidiasis of vulva and vagina: Secondary | ICD-10-CM | POA: Insufficient documentation

## 2016-02-12 DIAGNOSIS — B3731 Acute candidiasis of vulva and vagina: Secondary | ICD-10-CM | POA: Insufficient documentation

## 2016-02-12 DIAGNOSIS — Z6841 Body Mass Index (BMI) 40.0 and over, adult: Secondary | ICD-10-CM

## 2016-02-12 LAB — PR WET PREP/KOH/TEST, ONSITE: Epithelial Cells, Wet Prep: NORMAL

## 2016-02-12 LAB — PR U/A NONAUTO DIPSTICK ONLY, ONSITE
Glucose, Urine: NEGATIVE mg/dL
Ketones, URN: NEGATIVE mg/dL
Leukocytes: NEGATIVE
Nitrite, URN: NEGATIVE
Occult Blood, URN: NEGATIVE
Protein: NEGATIVE mg/dL
Urobilinogen, URN: 0.2 E.U./dL (ref 0.2–1.0)
pH, URN (UWNC): 6 (ref 5.0–8.0)

## 2016-02-12 MED ORDER — FLUCONAZOLE 200 MG OR TABS
ORAL_TABLET | ORAL | 3 refills | Status: AC
Start: 2016-02-12 — End: ?

## 2016-02-12 MED ORDER — TOLNAFTATE 1 % EX POWD
CUTANEOUS | 2 refills | Status: DC
Start: 2016-02-12 — End: 2016-03-23

## 2016-02-12 MED ORDER — CLOBETASOL PROPIONATE 0.05 % EX OINT
TOPICAL_OINTMENT | CUTANEOUS | 2 refills | Status: AC
Start: 2016-02-12 — End: ?

## 2016-02-12 NOTE — Patient Instructions (Signed)
Use clobetasol nightly to your vestibule  Use fluconazole every 3 days x 3 doses, then once a week

## 2016-02-12 NOTE — Progress Notes (Signed)
I saw and evaluated the patient. I have reviewed the resident's documentation and agree with it  Patient well known to me.  Today with increased vaginal burning and pruritis, concern for yeast.  Also wondering if has UTI.  Doing better with Nexplanon now with minimal bleeding  I agree with the exam:  Has vestibular erythema and 2+ tenderness in horseshoe shaped distribution of vestibule, fissure in the R lateral perineum between vagina and rectum, and + occ hyphae on wet mount.  Given the relationship between yeast and vestibulitis exacerbation, will aggressively treat both with fluconazole Q 3 dayx 3 then prevention therapy for 8 weeks, and also clobetasol for vestibule (as had previously felt triamcinolone was not helpful)  No sign UTI today   rtc 6 weeks
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
## 2016-02-13 ENCOUNTER — Encounter (HOSPITAL_BASED_OUTPATIENT_CLINIC_OR_DEPARTMENT_OTHER): Payer: Self-pay | Admitting: Obstetrics/Gynecology

## 2016-02-16 LAB — R/O YEAST CULT W/DIRECT EXAM: Stain For Fungus: NONE SEEN

## 2016-02-17 ENCOUNTER — Encounter (HOSPITAL_BASED_OUTPATIENT_CLINIC_OR_DEPARTMENT_OTHER): Payer: Self-pay | Admitting: Obstetrics/Gynecology

## 2016-02-17 ENCOUNTER — Ambulatory Visit
Payer: No Typology Code available for payment source | Attending: Family Medicine | Admitting: Rehabilitative and Restorative Service Providers"

## 2016-02-17 DIAGNOSIS — M25562 Pain in left knee: Secondary | ICD-10-CM | POA: Insufficient documentation

## 2016-02-17 DIAGNOSIS — G8929 Other chronic pain: Secondary | ICD-10-CM

## 2016-02-17 DIAGNOSIS — R102 Pelvic and perineal pain: Secondary | ICD-10-CM | POA: Insufficient documentation

## 2016-02-17 NOTE — Progress Notes (Signed)
Physical Therapy Daily Note     The following identifiers were confirmed with the patient today: name and date of birth  Referring provider: Sandra Jean Anderson    Referring provider NPI: [id number]     Visit count: 5    Subjective/Pain:   Jenna reports the knees are doing okay, but she continues to get pain with going down stairs > up stairs, and with walking > 20-30 minutes. She had a flare up of knee pain after last visit, which subsided by the next day. She isn't sure what may have caused that. Reports compliance with home program.     Objective:  Skilled services/interventions provided:  Performed HEP to review mechanics and monitor for compensations.    Neuromuscular Re-education:  -Tandem balance firm ground  -single leg stance firm ground    Therapeutic Exercise:  -step ups  -Standing hip abduction with yellow band  -sit to stand (eccentric control focus)  -sit to stand with blue theraband (eccentric control focus)      Assessment: progressing slowly as expected, able to do exercises with fair-good control and without pain in clinic. Fatigues quickly, so home program is appropriate.  Patient's response to therapy: no increased pain    Change in impairments: Less occurrence of pain.    Patient education/instruction: verbal instructions and demonstrations were provided for the above exercises  a handout was provided describing the exercises in written and picture format  the exercises that were provided last session were reviewed with the patient today  the patient was able to return demonstrate the exercises accurately after instructions    HOME EXERCISE PROGRAM:    Access Code: [email or web address]   URL: [email or web address]   Date: 01/21/2016  Prepared by: CAITLIN MARRA    Exercises - do 3 a day     Step Up - 10 reps - 2 sets - 2 hold - 1x daily - 7x weekly   Standing Hip Abduction Kicks - 10 reps - 2 sets - 2 hold - 1x daily - 7x weekly   Sit to stand slowly, blue theraband to fatigue   Tandem Stance  - 10 reps - 2 sets - 2 hold - 1x daily - 7x weekly   Single Leg Stance - 10 reps - 2 sets - 2 hold - 1x daily - 7x weekly      Functional limitation(s) remaining requiring continued skilled services: stair navigation, walking duration    GOALS:    Short-term goals:                               1. Patient will be ascend the stairs into her home with mild-no pain in 4 weeks. (current, moderate pain 02/17/16)                               2. Patient will be able to walk for 10 minutes with mild-no pain and no subjective reports of her knee giving way in 5 weeks. (goal met 02/17/16)    Long-term functional goals:                    1. Patient will report 2 point reduction in worst pain score as measured on NPRS in 8 weeks. (not met, worst pain reported 9/10)                                 2. Patient will report 9 point improvement or greater in LEFS score in 8 weeks. (not met, current 9 point worse score)                               3. Patient will return to her PRIOR LEVEL OF FUNCTION with regards to stairs and walking distances in 10 weeks. (not met)    Plan: follow up in 3-4 weeks to assess response to strengthening program, work towards eccentric control  Planned Intervention(s): Patient Education;Manual Therapy;Therapeutic Excerise;Therapeutic Activities;Neuromuscular Re-education;Gait Training;Self-care / Home Management;Physical Agents;Electrical Stimulation     Prognosis: Good  Therapy Frequency: 1/wk (with decreased frequency as able), 2-3 months    Natasha Parman, PT, DPT  Board-Certified Orthopedic Clinical Specialist  [email or web address]  P: [phone]

## 2016-02-18 ENCOUNTER — Telehealth (INDEPENDENT_AMBULATORY_CARE_PROVIDER_SITE_OTHER): Payer: Self-pay | Admitting: Obstetrics/Gynecology

## 2016-02-18 DIAGNOSIS — R300 Dysuria: Secondary | ICD-10-CM

## 2016-02-18 NOTE — Telephone Encounter (Signed)
Situation: Kristen Salinas called.   Background: Patient was last seen in office 01/11/16 with Dr. Craige CottaKirby   Assessment: Patient states she has been experiencing urinary retention, burning while urinating, urinary frequency, and double voiding. Patient scheduled for follow up with Dr. Craige CottaKirby on 02/29/16 at 8:20am. Patient is requesting sooner appointment due to urgent symptoms. No sooner availability at either [facility] or [facility]. Spoke with Aram Beechamynthia, RN regarding patient's symptoms. Nurse advised if patient is unable to urinate, or is experiencing urinary retention, patient needs to go to ER. Advised patient of nurses instructions and patient verified she is able to pee now, but was experiencing retention previously. Patient understood and confirmed will go to ER if retention occurs again.   Request: Please call the patient back to discuss symptoms, thank you.     Telephone Information:   Home Phone [phone]   Work Phone [phone]   Mobile [phone]   [facility] to leave a detailed message on mobile number.

## 2016-02-19 ENCOUNTER — Other Ambulatory Visit (INDEPENDENT_AMBULATORY_CARE_PROVIDER_SITE_OTHER): Payer: Self-pay

## 2016-02-19 ENCOUNTER — Encounter (HOSPITAL_BASED_OUTPATIENT_CLINIC_OR_DEPARTMENT_OTHER): Payer: No Typology Code available for payment source | Admitting: Rehabilitative and Restorative Service Providers"

## 2016-02-19 ENCOUNTER — Other Ambulatory Visit
Admit: 2016-02-19 | Discharge: 2016-02-19 | Disposition: A | Discharge status: Home / Self Care | Payer: No Typology Code available for payment source | Attending: Obstetrics/Gynecology | Admitting: Obstetrics/Gynecology

## 2016-02-19 ENCOUNTER — Ambulatory Visit (HOSPITAL_BASED_OUTPATIENT_CLINIC_OR_DEPARTMENT_OTHER): Payer: No Typology Code available for payment source

## 2016-02-19 ENCOUNTER — Telehealth (INDEPENDENT_AMBULATORY_CARE_PROVIDER_SITE_OTHER): Payer: Self-pay | Admitting: Obstetrics/Gynecology

## 2016-02-19 ENCOUNTER — Encounter (INDEPENDENT_AMBULATORY_CARE_PROVIDER_SITE_OTHER): Payer: Self-pay

## 2016-02-19 DIAGNOSIS — R3911 Hesitancy of micturition: Secondary | ICD-10-CM

## 2016-02-19 DIAGNOSIS — R300 Dysuria: Secondary | ICD-10-CM

## 2016-02-19 DIAGNOSIS — F4310 Post-traumatic stress disorder, unspecified: Secondary | ICD-10-CM

## 2016-02-19 LAB — URINALYSIS COMPLETE, URN
Bilirubin (Qual), URN: NEGATIVE
Epith Cells_Renal/Trans,URN: NEGATIVE /HPF
Glucose Qual, URN: NEGATIVE mg/dL
Ketones, URN: NEGATIVE mg/dL
Leukocyte Esterase, URN: NEGATIVE
Nitrite, URN: NEGATIVE
Protein (Alb Semiquant), URN: NEGATIVE mg/dL
Specific Gravity, URN: 1.015 g/mL (ref 1.002–1.027)
WBC, URN: NEGATIVE /HPF
pH, URN: 5.5 (ref 5.0–8.0)

## 2016-02-19 NOTE — Telephone Encounter (Signed)
Dr. Craige CottaKirby,  Please let me know if there is anything else needed.    Called pt as she thinks message about her symptoms was misunderstood.  She is not having urinary retention and is pretty sure she has a uti.   New sxs for about 5 days-dysuria, urgency and bilateral  flank pain. Bladder feels inflamed (sore).  No fever, chills, or gross hematuria.  UA from 02/12/16 was negative but pt thinks sxs are worsening.  Next Steps:  Pt will get UA today.  Gave her phone number of [facility] if she doesn't hear back about results by the end of the day.

## 2016-02-19 NOTE — Telephone Encounter (Signed)
If she is voiding fine now, then it is not an emergency.  Please offer her a nurse visit with a bladder scanner and CISC teaching to that she can check residuals at home.

## 2016-02-19 NOTE — Addendum Note (Signed)
Addended by: Pleasant Britz LEE on: 02/19/2016 09:07 AM     Modules accepted: Orders

## 2016-02-19 NOTE — Telephone Encounter (Addendum)
Spoke with patient regarding e-care message received. Patient requesting to speak with medical staff regarding bladder infection symptoms. Patient does not feel catheter is necessary and would prefer to discuss alternate options. Please call the patient back at your earliest convenience, thank you.     Telephone Information:   Home Phone [phone]   Work Phone [phone]   Mobile [phone]   [facility] to leave a detailed message.

## 2016-02-19 NOTE — Telephone Encounter (Signed)
(  TEXTING IS AN OPTION FOR [facility] CLINICS ONLY)  Is this a [facility] clinic? No      RETURN CALL: Detailed message on voicemail only      SUBJECT:  General Message     REASON FOR REQUEST: Returning call    MESSAGE: Patient is calling in per her ecare message she said she can be at clinic in the next hour, please call, thank you

## 2016-02-19 NOTE — Telephone Encounter (Signed)
See eCare message.

## 2016-02-19 NOTE — Telephone Encounter (Signed)
Discussed with Dr. Craige CottaKirby. Pt does not need to see her but she can schedule a nurse visit for a bladder scan (ok if less than 5-6oz) and learn to self-cath.  Left her voice message saying I will send her an eCare message.

## 2016-02-21 LAB — URINE C/S: Colony Count: 1500

## 2016-02-22 ENCOUNTER — Telehealth (INDEPENDENT_AMBULATORY_CARE_PROVIDER_SITE_OTHER): Payer: Self-pay

## 2016-02-22 ENCOUNTER — Telehealth (HOSPITAL_BASED_OUTPATIENT_CLINIC_OR_DEPARTMENT_OTHER): Payer: Self-pay

## 2016-02-22 NOTE — Telephone Encounter (Signed)
-----   Message from Ailene Rud, MD sent at 02/22/2016  7:27 AM PDT -----  Could you please call and let this patient know she does not have a UTI?    Thanks!  Kristen Salinas      ----- Message -----  From: Christain [place] Results Interface, User  Sent: 02/19/2016   1:27 PM  To: Ailene Rud, MD

## 2016-02-22 NOTE — Telephone Encounter (Signed)
Left message notifying pt that test came back neg.

## 2016-02-22 NOTE — Telephone Encounter (Signed)
Tolnaftate 1% powder is a non-formulary medication on the patients' insurance plan.     Please consider changing to miconazole cream or clotrimazole cream.   (The prescription can be entered using this current telephone encounter)    Pharmacy: [facility] [phone] [place] [phone]     If the option(s) provided are not are appropriate please provide clinical rational so a prior authorization exception may be pursued.

## 2016-02-24 ENCOUNTER — Other Ambulatory Visit (HOSPITAL_BASED_OUTPATIENT_CLINIC_OR_DEPARTMENT_OTHER): Payer: Self-pay | Admitting: Obstetrics/Gynecology

## 2016-02-24 DIAGNOSIS — B372 Candidiasis of skin and nail: Secondary | ICD-10-CM

## 2016-02-24 MED ORDER — MICONAZOLE NITRATE 2 % POWD
1 refills | Status: DC
Start: 2016-02-24 — End: 2016-03-23

## 2016-02-25 ENCOUNTER — Telehealth (INDEPENDENT_AMBULATORY_CARE_PROVIDER_SITE_OTHER): Payer: Self-pay | Admitting: Family Medicine

## 2016-02-25 ENCOUNTER — Ambulatory Visit (HOSPITAL_BASED_OUTPATIENT_CLINIC_OR_DEPARTMENT_OTHER): Payer: No Typology Code available for payment source | Admitting: Rehabilitative and Restorative Service Providers"

## 2016-02-25 DIAGNOSIS — R102 Pelvic and perineal pain: Secondary | ICD-10-CM

## 2016-02-25 DIAGNOSIS — F320 Major depressive disorder, single episode, mild: Secondary | ICD-10-CM

## 2016-02-25 DIAGNOSIS — N94810 Vulvar vestibulitis: Secondary | ICD-10-CM

## 2016-02-25 DIAGNOSIS — R3911 Hesitancy of micturition: Secondary | ICD-10-CM

## 2016-02-25 MED ORDER — DULOXETINE HCL 20 MG OR CPEP
20.0000 mg | DELAYED_RELEASE_CAPSULE | Freq: Every day | ORAL | 1 refills | Status: DC
Start: 2016-02-25 — End: 2016-03-07

## 2016-02-25 NOTE — Telephone Encounter (Signed)
(  TEXTING IS AN OPTION FOR [facility] CLINICS ONLY)  Is this a [facility] clinic? No      RETURN CALL: General message OK      SUBJECT:  Refill Request    MEDICATION(S): Cymbalta  NEEDED BY: today  PRESCRIBING PROVIDER: Anderson,Sandra  PHARMACY NAME AND LOCATION: [facility]  PHARMACY PHONE: #[phone]  PHARMACY FAX NUMBER: na  ADDITIONAL INFORMATION: na

## 2016-02-25 NOTE — Telephone Encounter (Signed)
The patient last received this medication at the requesting pharmacy on 01/24/16. (#30)

## 2016-02-25 NOTE — Telephone Encounter (Signed)
Medication Refill Documentation  Prescribing Provider Sandi J Anderson, MD  Date last prescribed 12/25/15

## 2016-02-25 NOTE — Progress Notes (Signed)
Physical Therapy Evaluation     This note serves as a CMS evaluation form that requires the referring provider to certify the need for therapy services furnished under this Plan of Treatment. The signing provider is certifying the plan of care.    The following patient identifiers were confirmed today: name and date of birth.     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 She has been seen by physical therapy, urogyn in the past, with some interventions helping and others not helping. She recently did not respond favorably to her pelvic floor muscle injections and c/o difficulty emptying her bladder after the last injection. Due to symptoms not improving, the patient consulted with Ailene Rud who referred patient to physical therapy for evaluation and treatment.    General Assessment  Date of Symptom Onset :  (chronic)  Start of Care Date: 02/25/16  Reason for Referral: pelvic pain, difficulty emptying bladder  Aggravating Factors: caffeine  Relieving Factors: use of her medication for vulvar tissue health  Interpreter Status: Not Needed    Prior Level of Function  ADLs: Independent with all ADLs  IADLs: Independent with all ADLs  Work/Education Life: Independent with all ADLs  Community/Leisure Activities: Independent with all ADLs    Therapy Goals  Patient Goals: improve pelvic pain and ability to empty bladder  Potential Barriers to Achieving Goals: Chronicity or Severity;Comorbidities         ADLs: Independent with all ADLs    Past Medical History:    Diagnosis Date   . Anxiety    . Chest pain    . Depression    . Disorder of menstrual bleeding    . Frequent use of laxatives    . Gastric ulcer    . GERD (gastroesophageal reflux disease)    . Headache    . Heart burn    . Heart murmur    . History of irritable bowel syndrome    . Indigestion    . Nausea and vomiting    . Obesity    . Pap smear abnormality of cervix    . 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 CARPAL TUNNEL RELEASE     . COLONOSCOPY     . ESOPHAGOGASTRODUODENOSCOPY         Initial assessment:     Urinary leakage: no  Urge: yes, with low volumes often  Bladder irritants: yes, has removed some and done well with this  # bowel movements: variable--does have constipation often    Objective:  Observation: presents with husband, Marcial Pacas.  Skin integrity: some increased redness (mild)  Posture: decreased lordosis    Impairments:       Range of motion/Quality of motion/Recruitment Pattern: fair/fair/fair        Muscle Strength: Laycock: 2/dnt/dnt/dnt (MMT/Hold in seconds/repetitions at hold/quick)        Sensation: wnl   Cotton Swab mild pain reproduced.  Palpation:high tone and pain of levator ani and compressor urethra   Lower Quadrant Screen:     Lumbar ROM: wfl    Hip ROM: wfl    LE Muscle Strength: mild weakness    Lower Extremity muscle length: wfl    Pelvic stability tests: (+) aslr    Interventions/Treatment provided:   Therapeutic exercise:     Access Code: [email or web address]   URL: [email or web address]   Date: 02/25/2016  Prepared by: Larkin Ina    Program Notes  1) Bladder diary  2) Use meds for tissue health  3) Bladder irritants--review list  4) Continue to use a good bladder emptying posture on toilet    Exercises  -Supine Piriformis Stretch Pulling Heel to Hip - 2 reps - 3x daily - 7x weekly  -Supine Diaphragmatic Breathing with Pelvic Floor Lengthening - 3x daily - 7x weekly    Home exercise  instruction: verbal instructions and demonstrations were provided for the above exercises  a handout was provided describing the exercises in written and picture format    ASSESSMENT: Rehabilitation potential is: Good. Symptoms are moderate in severity, moderate in irritability, musculoskeletal and urogyn in nature, chronic, and worsening. Exam notable for pain reproduced with pelvic floor muscle assessment, hip weakness, and patient not practicing optimal pelvic health with regards to her medications prescribed (not using).    PLAN of care: Patient education in pelvic girdle anatomy/function, bladder and bowel health, bladder/or bowel retraining, and skin care.  Review bladder diary, exercises, and consider manual therapy for downtraining as needed.  Planned Intervention(s): Patient Education;Manual Therapy;Therapeutic Excerise;Therapeutic Activities;Neuromuscular Re-education;Gait Training;Biofeedback Training;Self-care / Home Management;Physical Agents;Electrical Stimulation     Prognosis: Good  Therapy Frequency: 1/wk    Functional goals:  1. Pelvic pain reduced by 75% or better by 05/25/2016.  2. Independent and proficient with home program components including therapeutic exercise and self care to manage condition by 05/25/2016.  3. Able to empty bladder (ease and lack of pain) at PRIOR LEVEL OF FUNCTION by 05/25/2016.    This patient will be seen by a physical therapist or a physical therapist assistant following this plan of care.    Trudie Reed, PT  P: [phone]  F: [phone]  [email or web address]

## 2016-02-26 ENCOUNTER — Ambulatory Visit (HOSPITAL_BASED_OUTPATIENT_CLINIC_OR_DEPARTMENT_OTHER): Payer: No Typology Code available for payment source

## 2016-02-26 DIAGNOSIS — F4310 Post-traumatic stress disorder, unspecified: Secondary | ICD-10-CM

## 2016-02-29 ENCOUNTER — Ambulatory Visit: Payer: No Typology Code available for payment source | Admitting: Obstetrics/Gynecology

## 2016-02-29 ENCOUNTER — Encounter (INDEPENDENT_AMBULATORY_CARE_PROVIDER_SITE_OTHER): Payer: Self-pay | Admitting: Obstetrics/Gynecology

## 2016-02-29 ENCOUNTER — Encounter (INDEPENDENT_AMBULATORY_CARE_PROVIDER_SITE_OTHER): Payer: Self-pay

## 2016-02-29 ENCOUNTER — Ambulatory Visit (INDEPENDENT_AMBULATORY_CARE_PROVIDER_SITE_OTHER): Payer: No Typology Code available for payment source

## 2016-02-29 VITALS — BP 134/85 | HR 86 | Wt 250.0 lb

## 2016-02-29 DIAGNOSIS — Z6841 Body Mass Index (BMI) 40.0 and over, adult: Secondary | ICD-10-CM

## 2016-02-29 DIAGNOSIS — R3989 Other symptoms and signs involving the genitourinary system: Secondary | ICD-10-CM

## 2016-02-29 NOTE — Progress Notes (Signed)
Taught pt Self-Catheterization per Dr. Evlyn Courier order.    Pt's boyfriend assisted by holding mirror.  Pt was unable to insert catheter; after I used catheter and drained about 300's of clear yellow urine, pt said she felt faint and did not want to continue.  She rested and felt better.    She said she knows where to insert the catheter and her boyfriend said he is able to assist.  Review the following which I will also send in eCare message.        1. You may catheterize yourself while sitting on the toilet, in a chair, in bed or while standing.    2. Wash your hands well with soap and water.    3. Use your non-dominant hand to separate your labia (the folds of skin around your vaginal opening).    4. Wash the urinary meatus (the opening where your urine comes out) with a towelette or soapy washcloth.  Rinse with a wet washcloth if using soap.  Always wipe front to back.    5. Put a small amount of lubrication on the tip of the catheter.    6.  In your dominant hand, insert it into the urinary meatus directing it upwards and forward toward the umbilicus (belly button) until urine flows freely into the toilet or a container. Then gently push the catheter in one more inch to help the urine drain.    7. Hold the catheter in place until your bladder has been drained.     8. When urine stops flowing, slowly pull the catheter out.    9. Wash your hands with soap and water.      Catheter Care    1. Lather up your hands with soap and wash the catheter by rubbing it between your soapy hands.    2. Rinse well with water.    3. Lay catheter on a clean towel to air dry.    4. Store the catheter is a clean plastic bag or other clean container.    5. Catheters may be reused until they become brittle, show wear, crack or do not drain well.

## 2016-02-29 NOTE — Progress Notes (Signed)
[facility]  Chief Complaint   Patient presents with   . Pain   . Bladder Problems   . Urine Problem       History:  25 year old female presents today for follow up for bladder pain.  Stopped caffeine for two weeks - helped some, then started decaffeinated coffee again - now pain with or without caffeine.  Feels pain with bladder fullness and pain and spasm when she tries to void in the morning sometimes.  Pyridium and Urelle and over the counter pain medications are not helping.  Not sure why her bladder pain is worse.  Used to tolerate coffee just fine, but for the past month ago has worse pain with caffeine, with a full bladder, and with trying to void.  Thinks decreasing her cymbalta may have exacerbated her pain.  Symptoms seem so to be aggravated with vaginal bleeding (on Nexplanon).    Exam:    Vitals: BP 134/85   Pulse 86   Wt (!) 250 lb (113.4 kg)   BMI 44.29 kg/m   Neuro/psych:  Alert and oriented x 3 in a pleasant mood  Skin: Warm, dry and intact    Procedures:   Taught CISC with Cindy Post.    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    Plan:   Cystoscopy with hydrodistension.  CISC as needed if unable to void.  Consider weekly bladder instillations for flares.  Talk with your doctor about going back to your higher dose of Cymbalta    I spent a total time of 20 minutes face-to-face with the patient, of which more than 50% was spent counseling about natural history of PBS/IC and treatment options as outlined in this note.

## 2016-02-29 NOTE — Patient Instructions (Signed)
Call Katheren Puller next week if you have not heard from her: [phone] to schedule this patient for cystoscopy with distension in the OR

## 2016-03-03 ENCOUNTER — Other Ambulatory Visit
Admit: 2016-03-03 | Discharge: 2016-03-03 | Disposition: A | Discharge status: Home / Self Care | Payer: No Typology Code available for payment source | Attending: Obstetrics/Gynecology | Admitting: Obstetrics/Gynecology

## 2016-03-03 ENCOUNTER — Other Ambulatory Visit (INDEPENDENT_AMBULATORY_CARE_PROVIDER_SITE_OTHER): Payer: Self-pay

## 2016-03-03 ENCOUNTER — Ambulatory Visit
Payer: No Typology Code available for payment source | Attending: Obstetrics/Gynecology | Admitting: Rehabilitative and Restorative Service Providers"

## 2016-03-03 DIAGNOSIS — R300 Dysuria: Secondary | ICD-10-CM | POA: Insufficient documentation

## 2016-03-03 DIAGNOSIS — R3911 Hesitancy of micturition: Secondary | ICD-10-CM

## 2016-03-03 DIAGNOSIS — R102 Pelvic and perineal pain: Secondary | ICD-10-CM | POA: Insufficient documentation

## 2016-03-03 LAB — URINALYSIS COMPLETE, URN
Bacteria, URN: NONE SEEN
Bilirubin (Qual), URN: NEGATIVE
Epith Cells_Renal/Trans,URN: NEGATIVE /HPF
Epith Cells_Squamous, URN: NEGATIVE /LPF
Glucose Qual, URN: NEGATIVE mg/dL
Ketones, URN: NEGATIVE mg/dL
Leukocyte Esterase, URN: NEGATIVE
Nitrite, URN: NEGATIVE
Protein (Alb Semiquant), URN: NEGATIVE mg/dL
RBC, URN: NEGATIVE /HPF
Specific Gravity, URN: 1.005 g/mL (ref 1.002–1.027)
WBC, URN: NEGATIVE /HPF
pH, URN: 5.5 (ref 5.0–8.0)

## 2016-03-03 NOTE — Progress Notes (Signed)
Physical Therapy Daily Note     The following identifiers were confirmed with the patient today: name and date of birth  Referring provider: Ailene Rud    Referring provider NPI: [id number]     Visit count: 2    Subjective/Pain:   Kristen Salinas reports fair-good compliance with home exercise program. She recently learned to self-cath due to difficulty emptying, but states this was very painful and almost caused her to pass out. She has not tried this at home. She is worried about bladder installations, but is considering them. She is experimenting with her bladder irritants still. She hasn't used her medications for vulvar tissue as prescribed yet.    Objective:  Skilled services/interventions provided:  Performed HEP to review mechanics and monitor for compensations.    Manual therapy:   -1st and 2nd layer pelvic floor muscle soft tissue mobilization     Neuromuscular Re-education:  -pelvic floor muscle downtraining through deep breathing, visualization, various positions    Therapeutic Exercise:  -seated buttock stretch  -supine buttock stretch    Self-Care/Home Management:  -recommended use of medications after she brushes her teeth each night (ties it to a thing she already does regularly)    Patient education on how overactive pelvic floor muscle can cause her c/o of difficulty emptying (despite strong urges), dribbling of urine when she strains to have a bowel movement)    Pre-treatment objective measures: tenderness at perineal body, superficial transverse perineal on L > R  Post-treatment objective measures: improved tissue tone/tenderness after manual    Assessment: patient does have short term improvement in symptoms and tension after her stretches and breathing, which is positive. She may benefit from more frequent manual therapy/ self treatment for further relaxation efforts--tolerated very well today. Her bladder does appear to be sensitive to certain irritants and then this causes spasm and  difficulty emptying.  Patient's response to therapy: states understanding.   Change in impairments: improved pelvic floor muscle awareness.    Patient education/instruction: verbal instructions and demonstrations were provided for the above exercises  a handout was provided describing the exercises in written and picture format  the patient was able to return demonstrate the exercises accurately after instructions    Functional limitation(s) remaining requiring continued skilled services: difficulty with bladder and bowel emptying    HOME EXERCISE PROGRAM:    Access Code: [email or web address]   URL: [email or web address]   Date: 02/25/2016  Prepared by: Larkin Ina    Program Notes  1) Bladder diary  2) Use meds for tissue health  3) Bladder irritants--review list  4) Continue to use a good bladder emptying posture on toilet    Exercises  -Supine Piriformis Stretch Pulling Heel to Hip - 2 reps - 3x daily - 7x weekly  -Supine Diaphragmatic Breathing with Pelvic Floor Lengthening - 3x daily - 7x weekly    GOALS:    1. Pelvic pain reduced by 75% or better by 05/25/2016.  2. Independent and proficient with home program components including therapeutic exercise and self care to manage condition by 05/25/2016.  3. Able to empty bladder (ease and lack of pain) at PRIOR LEVEL OF FUNCTION by 05/25/2016.    Plan: manual and possibly biofeedback for overall downtraining. Follow up on med use, exercises, pelvic floor muscle awareness. Consider abdominal strengthening without over activating pelvic floor muscles.    Trudie Reed, PT

## 2016-03-04 ENCOUNTER — Telehealth (INDEPENDENT_AMBULATORY_CARE_PROVIDER_SITE_OTHER): Payer: Self-pay

## 2016-03-04 ENCOUNTER — Encounter (INDEPENDENT_AMBULATORY_CARE_PROVIDER_SITE_OTHER): Payer: Self-pay

## 2016-03-04 ENCOUNTER — Ambulatory Visit (HOSPITAL_BASED_OUTPATIENT_CLINIC_OR_DEPARTMENT_OTHER): Payer: No Typology Code available for payment source

## 2016-03-04 DIAGNOSIS — F4310 Post-traumatic stress disorder, unspecified: Secondary | ICD-10-CM

## 2016-03-04 NOTE — Telephone Encounter (Signed)
-----   Message from Ailene Rud, MD sent at 03/03/2016 12:12 PM PDT -----  No evidence of infection on UA

## 2016-03-04 NOTE — Telephone Encounter (Signed)
Sent pt eCare message with results.

## 2016-03-05 LAB — URINE C/S: Colony Count: 2000

## 2016-03-07 ENCOUNTER — Encounter (INDEPENDENT_AMBULATORY_CARE_PROVIDER_SITE_OTHER): Payer: Self-pay | Admitting: Family Medicine

## 2016-03-07 ENCOUNTER — Ambulatory Visit (INDEPENDENT_AMBULATORY_CARE_PROVIDER_SITE_OTHER): Payer: No Typology Code available for payment source | Admitting: Family Medicine

## 2016-03-07 VITALS — BP 124/80 | HR 94 | Temp 98.7°F | Resp 16 | Ht 63.0 in | Wt 253.0 lb

## 2016-03-07 DIAGNOSIS — F5101 Primary insomnia: Secondary | ICD-10-CM

## 2016-03-07 DIAGNOSIS — Z6841 Body Mass Index (BMI) 40.0 and over, adult: Secondary | ICD-10-CM

## 2016-03-07 DIAGNOSIS — F320 Major depressive disorder, single episode, mild: Secondary | ICD-10-CM

## 2016-03-07 DIAGNOSIS — K5900 Constipation, unspecified: Secondary | ICD-10-CM

## 2016-03-07 DIAGNOSIS — F4310 Post-traumatic stress disorder, unspecified: Secondary | ICD-10-CM

## 2016-03-07 MED ORDER — DULOXETINE HCL 30 MG OR CPEP
30.0000 mg | DELAYED_RELEASE_CAPSULE | Freq: Every day | ORAL | 1 refills | Status: DC
Start: 2016-03-07 — End: 2016-04-25

## 2016-03-07 NOTE — Progress Notes (Signed)
S: Pt  presents for follow up on sleep disturbance, depression and PTSD.    Symptoms have worsening.  Bothersome symptoms still include depressed mood, fatigue, feelings of worthlessness/guilt, difficulty concentrating and anxiety.  Pt denies activeSI/HI, A/V hallucinations.   Does occasionally get suicidal thoughts, transient, able to put tyhese thoughts out of her mind, does not feel unsafe, able to easily promise no self harm.  Voices awareness of resources including crisis line, husband, Dr on call, nurse line.    Patient reports issues of constipation despite fluid and fiber supplements  Has miralax, was given for post op use, has not tried this    Fatigue, sleepiness  Thinks it may have been worse on higher doses of Cymbalta also sexual SE    Has never tried Wellbutrin  No h/o seizures    Does not felt " amped " up on Abilify so worries may feel that way on Wellbutrin    The patient HAS been taking medications as noted.  Side effects include ? Unsure if bladder issues could be related. Also above issues with constipation and sexual SE.    For fatigue patient tends to drink more coffee  Causes muscle twitching in her sleep, also aggravates bladder issues    The patient HAS been going to counseling.  Current Stressors:  No new.    Bladder pelvic pain issues  Seeing Dr Craige CottaKirby  Thinks may have IC    Pt notes bladder symptoms have worsened recently w/o other changes in diet and exercise    Last week had nurse trying to teach her to self cath    Notes there was a scratch left by urethra  Patient wants to make sure that is healing    Notes pelvic pain, cervix feels heavy and sore with prolonged standing.  New feeling.    O:  BP 124/80   Pulse 94   Temp 98.7 F (37.1 C) (Temporal)   Resp 16   Ht 5\' 3"  (1.6 m)   Wt (!) 253 lb (114.8 kg)   LMP 02/15/2016   SpO2 98%   BMI 44.82 kg/m   Affect normal affect, good eye contact.  neatly groomed, no agitation, normal speech.  GU: normal female external genitalia, no  signs of abrasion near urethra or on labia minora  Further exam deferred.    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  F/u in 6 weeks.    Time of this visit with counseling was 20 minutes.

## 2016-03-07 NOTE — Progress Notes (Signed)
Reviewed eCare status with Patient:  YES    HEALTH MAINTENANCE:  Has the patient had any of these since their last visit?    Cervical screening/PAP: Not Due     Mammo: Not Due    Colon Screen: Not Due    Diabetic Eye Exam: Not Due    Have you seen a specialist since your last visit: No        HM Due:   Health Maintenance   Topic Date Due   . Influenza Vaccine (1) 04/01/2016   . Chlamydia Screen  10/22/2016   . Pap Smear  10/22/2016   . Gonorrhea Screen  10/22/2016   . Tetanus Vaccine  12/26/2022   . HIV Screen  Completed   . HPV Vaccine  Completed           Future Appointments  Date Time Provider Department Center   03/07/2016 6:45 PM Cecelia ByarsAnderson, Sandra Jean, MD [facility]   03/10/2016 11:15 AM Trudie ReedParman, Natasha L, PT [facility]   03/11/2016 11:00 AM Patrecia PourJarka, Viridiana L, [facility]   03/18/2016 11:00 AM Patrecia PourJarka, Anjeli L, [facility]   03/23/2016 1:00 PM Trudie Reedarman, Natasha L, PT [facility]   03/25/2016 11:00 AM Patrecia PourJarka, Lashonda L, [facility]W [facility]   03/25/2016 1:00 PM Neale BurlyEckert, Linda F, MD [facility]    04/19/2016 8:00 AM Elon SpannerMiller, Jane Louise, MD [facility]

## 2016-03-07 NOTE — Patient Instructions (Signed)
It was a pleasure to see you in clinic today.                If you are not yet signed up for eCare, please see the below eCare section at the end of this document for how to enroll and to get your access codes.  It's easy to sign up at home today.    eCare  enrollment will allow you access to the below benefits   You can make appointments online   View test results / Lab Results   Request prescription renewals   Obtain a copy of our After Visit Summary (an electronic copy of this document)     Your Test Results:  If labs were ordered today the results are expected to be available via eCare in about 5 days. If you have an active eCare account, this is how we will notify you of your results.     If you do not have an eCare account then your test results will be mailed to you within about 14 days after your tests are completed. If your physician needs to change your care based on your results or is concerned, you should expect a phone call or eCare message from your provider.    If you have any questions about your test results please schedule an appointment with your provider, so that we may review them with you in greater detail.    **If it has been more than 2 weeks and you have not received your test results please send our office a message via eCare.    Medication Refills: If you need a prescription refilled, please contact your pharmacy 1 week before your current supply will run out to request the refill.  Contacting your pharmacy is the fastest and safest way to obtain a medication refill.  The pharmacy will notify our office.  Please note, that a minimum of 48 to 72 hours is needed to refill a medication,  Please call your pharmacy early to allow enough time to refill before you anticipate running out.  For faster medication refills, you can also schedule an appointment with your provider.    We know you have a choice in where you receive your healthcare and we sincerely thank you for trusting [facility] with your health.

## 2016-03-09 ENCOUNTER — Telehealth (INDEPENDENT_AMBULATORY_CARE_PROVIDER_SITE_OTHER): Payer: Self-pay | Admitting: Gastroenterology

## 2016-03-09 ENCOUNTER — Telehealth (INDEPENDENT_AMBULATORY_CARE_PROVIDER_SITE_OTHER): Payer: Self-pay | Admitting: Family Medicine

## 2016-03-09 NOTE — Telephone Encounter (Signed)
(  TEXTING IS AN OPTION FOR [facility] CLINICS ONLY)  Is this a [facility] clinic? Yes. Patient declined the option to receive mobile text messages.      RETURN CALL: Detailed message on voicemail only      SUBJECT:  Medication Management/Questions     MEDICATION(S): Duoxetine or Cymbalta 30mg   CONCERNS/QUESTIONS: Patient's insurance is requesting prior authorization for this medication.  ADDITIONAL INFORMATION: Clinic SOP directs CCR to send TE in this case. Please rush this request as patient is leaving for a trip on 8/11 and will not be back for approximately 2 weeks.

## 2016-03-09 NOTE — Telephone Encounter (Signed)
(  TEXTING IS AN OPTION FOR [facility] CLINICS ONLY)  Is this a [facility] clinic? No      RETURN CALL: Detailed message on voicemail only      SUBJECT:  General Message     REASON FOR REQUEST: Patient is experiencing  Difficulty/Discomfort during bowel movements and would like an appointment.    MESSAGE: Patient would [facility] before scheduling. She would like to return to the clinic but wants to know if her provider has other ideas for care since they spoke last year. Thank you!

## 2016-03-09 NOTE — Telephone Encounter (Signed)
Dr. Johnsie CancelKrishnamurthy,    This patient called in complaining of bowel discomfort. When she needs to use the restroom, she has a lot of pressure and discomfort. She's having trouble having a BM, and she describes it as sitting low. I tried to book the patient for your 12P short slot on 03/11/16, but the patient had a pending appt some where else. I ended up booking her in September on the 28th. Would you want to see this patient sooner? Or is the appt scheduled appropriate?    Whitney

## 2016-03-09 NOTE — Telephone Encounter (Signed)
Routing to PA Pool.

## 2016-03-10 ENCOUNTER — Other Ambulatory Visit (INDEPENDENT_AMBULATORY_CARE_PROVIDER_SITE_OTHER): Payer: Self-pay

## 2016-03-10 ENCOUNTER — Other Ambulatory Visit
Admit: 2016-03-10 | Discharge: 2016-03-10 | Disposition: A | Discharge status: Home / Self Care | Payer: No Typology Code available for payment source | Attending: Obstetrics/Gynecology | Admitting: Obstetrics/Gynecology

## 2016-03-10 ENCOUNTER — Ambulatory Visit (HOSPITAL_BASED_OUTPATIENT_CLINIC_OR_DEPARTMENT_OTHER): Payer: No Typology Code available for payment source | Admitting: Rehabilitative and Restorative Service Providers"

## 2016-03-10 ENCOUNTER — Telehealth (INDEPENDENT_AMBULATORY_CARE_PROVIDER_SITE_OTHER): Payer: Self-pay | Admitting: Obstetrics/Gynecology

## 2016-03-10 DIAGNOSIS — R3911 Hesitancy of micturition: Secondary | ICD-10-CM

## 2016-03-10 DIAGNOSIS — M62838 Other muscle spasm: Secondary | ICD-10-CM

## 2016-03-10 DIAGNOSIS — R102 Pelvic and perineal pain: Secondary | ICD-10-CM

## 2016-03-10 DIAGNOSIS — R300 Dysuria: Secondary | ICD-10-CM | POA: Insufficient documentation

## 2016-03-10 LAB — URINALYSIS COMPLETE, URN
Bacteria, URN: NONE SEEN
Bilirubin (Qual), URN: NEGATIVE
Epith Cells_Renal/Trans,URN: NEGATIVE /HPF
Glucose Qual, URN: NEGATIVE mg/dL
Ketones, URN: NEGATIVE mg/dL
Leukocyte Esterase, URN: NEGATIVE
Nitrite, URN: NEGATIVE
Protein (Alb Semiquant), URN: NEGATIVE mg/dL
RBC, URN: NEGATIVE /HPF
Specific Gravity, URN: 1.02 g/mL (ref 1.002–1.027)
WBC, URN: NEGATIVE /HPF

## 2016-03-10 NOTE — Telephone Encounter (Signed)
PA submitted via Navinet.  Will check for response in 24 Hours    [id number]

## 2016-03-10 NOTE — Telephone Encounter (Signed)
Called patient, told her lab results per Dr.Kirby she stated that she had pain and would like to discuss with the doctor.

## 2016-03-10 NOTE — Progress Notes (Signed)
Physical Therapy Daily Note     The following identifiers were confirmed with the patient today: name and date of birth  Referring provider: Ailene RudAnna Catherine Kirby    Referring provider NPI: [id number]     Visit count: 3    Subjective/Pain:   Kristen Salinas reports good compliance with home exercise program. She reports she has been using her medications for vulvar tissue now. She did have increased pelvic pain on Monday, Tuesday, and is unsure of trigger. She was able to continue doing exercises, which provided short term relief. Her symptoms improved on Wednesday and today. She inquires more about risk factors for pelvic organ prolapse and use of dilators today.    Objective:  Skilled services/interventions provided:  Performed HEP to review mechanics and monitor for compensations.    Manual therapy:   -1st and 2nd layer pelvic floor muscle soft tissue mobilization   -lower abdominals soft tissue mobilization     Neuromuscular Re-education:  -pelvic floor muscle downtraining through deep breathing, visualization, various positions    Self-Care/Home Management:  -education in dilators available    Pre-treatment objective measures: less tenderness of urogenital triangle, more at compressor urethrae this visit. Trigger points also palpated in abdominals.  Post-treatment objective measures: improved tissue tone/tenderness after manual    Assessment: patient does have short term improvement in symptoms and tension after her stretches and breathing, which is positive. She may benefit from more frequent manual therapy/ self treatment for further relaxation efforts--tolerated very well today. Her bladder does appear to be sensitive to certain irritants and then this causes spasm and difficulty emptying.  Patient's response to therapy: states understanding.   Change in impairments: improved pelvic floor muscle awareness.    Patient education/instruction: verbal instructions and demonstrations were provided for the above  exercises  a handout was provided describing the exercises in written and picture format  the patient was able to return demonstrate the exercises accurately after instructions    Functional limitation(s) remaining requiring continued skilled services: difficulty with bladder and bowel emptying    HOME EXERCISE PROGRAM:    Access Code: [email or web address]   URL: [email or web address]   Date: 02/25/2016  Prepared by: Larkin InaNatasha Neya Creegan    Program Notes  1) Try lower abdominal massage daily and research dilators or therawand for home pelvic floor muscle massage  2) Use meds for tissue health  3) Bladder irritants--review list  4) Continue to use a good bladder emptying posture on toilet    Exercises  -Supine Piriformis Stretch Pulling Heel to Hip - 2 reps - 3x daily - 7x weekly  -Supine Diaphragmatic Breathing with Pelvic Floor Lengthening - 3x daily - 7x weekly    GOALS:    1. Pelvic pain reduced by 75% or better by 05/25/2016.  2. Independent and proficient with home program components including therapeutic exercise and self care to manage condition by 05/25/2016.  3. Able to empty bladder (ease and lack of pain) at PRIOR LEVEL OF FUNCTION by 05/25/2016.    Plan: follow up on effect of massage; manual and possibly biofeedback for overall downtraining. Follow up on med use, exercises, pelvic floor muscle awareness. Consider abdominal strengthening without over activating pelvic floor muscles.    Kristen Salinas, PT

## 2016-03-10 NOTE — Patient Instructions (Signed)
Try lower abdominal massage daily (5-10 minutes) and research dilators or therawand for home pelvic floor muscle massage.

## 2016-03-11 ENCOUNTER — Encounter (INDEPENDENT_AMBULATORY_CARE_PROVIDER_SITE_OTHER): Payer: Self-pay | Admitting: Gastroenterology

## 2016-03-11 ENCOUNTER — Ambulatory Visit (HOSPITAL_BASED_OUTPATIENT_CLINIC_OR_DEPARTMENT_OTHER): Payer: No Typology Code available for payment source | Attending: Obstetrics & Gynecology

## 2016-03-11 ENCOUNTER — Ambulatory Visit: Payer: No Typology Code available for payment source | Admitting: Gastroenterology

## 2016-03-11 ENCOUNTER — Encounter (INDEPENDENT_AMBULATORY_CARE_PROVIDER_SITE_OTHER): Payer: Self-pay

## 2016-03-11 ENCOUNTER — Ambulatory Visit (HOSPITAL_BASED_OUTPATIENT_CLINIC_OR_DEPARTMENT_OTHER): Payer: No Typology Code available for payment source

## 2016-03-11 VITALS — BP 136/81 | HR 96 | Temp 98.6°F | Ht 63.0 in | Wt 254.0 lb

## 2016-03-11 VITALS — BP 138/84 | HR 92 | Resp 18 | Wt 254.2 lb

## 2016-03-11 DIAGNOSIS — K5900 Constipation, unspecified: Secondary | ICD-10-CM | POA: Insufficient documentation

## 2016-03-11 DIAGNOSIS — Z6841 Body Mass Index (BMI) 40.0 and over, adult: Secondary | ICD-10-CM

## 2016-03-11 DIAGNOSIS — R102 Pelvic and perineal pain: Secondary | ICD-10-CM | POA: Insufficient documentation

## 2016-03-11 DIAGNOSIS — N94810 Vulvar vestibulitis: Secondary | ICD-10-CM | POA: Insufficient documentation

## 2016-03-11 NOTE — Progress Notes (Signed)
refe

## 2016-03-11 NOTE — Patient Instructions (Signed)
1.Increase Magnesium Oxide tablets to 1200-1500mg  daily at bedtime  2.Drink Ginger and Dandelion root tea-3-4 cups daily  3.Try Heather's Tummy fiber -start with 1 tsp and gradually increase to 1 tbsp daily  4.Increase vegetables and fruits[Berries preferably]  5.Avoid straining when having a bowel movement  6.You need to have toothpaste consistency bowel movemts

## 2016-03-11 NOTE — Telephone Encounter (Signed)
I am aware of her bladder pain. She should call Katheren PullerMarie Porter to schedule the cystoscopy with hydrodistension.

## 2016-03-11 NOTE — Telephone Encounter (Signed)
Approval Received  [place]   Medication: Duloxetine HCL 30mg  Capsule DR   Quantity / Frequency: #30/30 days   Dates Approved: 03/08/16 - 03/11/17   Prior Auth. Number: [id number]     Additional Notes: Patient informed and will pick up Rx today.  She will call back if there are any issues.  [facility]/Clinic  If she calls and you are unable to assist with whatever is occurring please send to [phone].    Excerpt of Approval

## 2016-03-11 NOTE — Telephone Encounter (Signed)
Sent pt eCare message recommending she call  Katheren PullerMarie Porter to schedule the cystoscopy with hydrodistension.
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
## 2016-03-11 NOTE — Patient Instructions (Signed)
Thank you for visiting the [facility].   If you have any questions about your visit please call the triage nurse at [phone].

## 2016-03-12 ENCOUNTER — Encounter (INDEPENDENT_AMBULATORY_CARE_PROVIDER_SITE_OTHER): Payer: Self-pay | Admitting: Obstetrics/Gynecology

## 2016-03-12 LAB — URINE C/S: Colony Count: 6000
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
## 2016-03-14 ENCOUNTER — Telehealth (HOSPITAL_BASED_OUTPATIENT_CLINIC_OR_DEPARTMENT_OTHER): Payer: Self-pay | Admitting: Urology

## 2016-03-14 NOTE — Telephone Encounter (Signed)
(  TEXTING IS AN OPTION FOR [facility] CLINICS ONLY)  Is this a [facility] clinic? No      RETURN CALL: Detailed message on voicemail only      SUBJECT:  Cancellation/Reschedule Request     ORIGINAL APPOINTMENT DATE: 04/19/2016, TIME: 8:00 AM  REASON: Urinary incontinence with burning  REQUESTED DATE: As soon as possible (this week, if possible), TIME: Any time  UNABLE TO APPOINT BECAUSE: No sooner appointment than 04/19/16. Please contact the patient as soon as possible. Thanks!    Please note: [place] and requires a little over 3 hours, so please provide that much leeway when there is an opening. Thanks!

## 2016-03-14 NOTE — Progress Notes (Signed)
I saw and evaluated the patient. I have reviewed the resident's documentation and agree with it.

## 2016-03-15 ENCOUNTER — Telehealth (INDEPENDENT_AMBULATORY_CARE_PROVIDER_SITE_OTHER): Payer: Self-pay | Admitting: Obstetrics/Gynecology

## 2016-03-15 NOTE — Telephone Encounter (Signed)
Situation: Kristen Salinas called.   Background: Patient was last seen in office 02/29/16 with Dr. Craige CottaKirby  Assessment: Patient checking on status of eCare message sent to Dr. Craige CottaKirby this morning. Patient verified response from nurse received regarding forwarding message to provider.  Request: Please see previous eCare message with provider and call patient back to discuss, thank you.     Telephone Information:   Home Phone [phone]   Work Phone [phone]   Mobile [phone]

## 2016-03-16 NOTE — Telephone Encounter (Signed)
Patient called and is still waiting for a call back. Please call patient at [phone]

## 2016-03-16 NOTE — Telephone Encounter (Signed)
Pt has been added to wait list.

## 2016-03-16 NOTE — Telephone Encounter (Signed)
Patient wanted to be sure Dr Craige CottaKirby received her most recent eCare message  Copied below.     From  Kristen BridgemanJessica Nicole Salinas To  Ailene RudAnna Catherine Kirby, MD Sent  03/15/2016 9:12 AM   I'm also having some rectum pain it feels like, it's the mucles way back in the vagina, I saw the gastro and they changed my fiber intake and that hasn't changed the pain, after [name] and I had intercourse yesterday the back mucles were hurting and the pain was back,     I was wondering if we could do a trigger point injection back in those mucles the same day I have the bladder looked at?       She did not because I assumed this matter would be addressed at the time of her scheduled appointment and completed it. I will send this to Dr Craige CottaKirby now.       Stacie Acresynthia S. Jolana Runkles RN, BSN  [facility]  [facility]   Urology Clinic

## 2016-03-17 NOTE — Telephone Encounter (Signed)
Concern: Patient called back in to speak with staff regarding message sent to Dr. Craige CottaKirby.   Additional Information: Patient wanted to confirm that Dr. Craige CottaKirby was actually receiving her messages and that any direction regarding her care was coming straight from specialist and not her medical staff. Patient requested to schedule follow up, however next available appointment is on 04/18/16. Patient declined soonest available and would like to know if she can be worked in sooner. Patient also inquired about Tommie ArdMarie Porter's response to moving up surgery (trigger point injection). Spoke with Hilda LiasMarie who advised message was sent to Dr. Craige CottaKirby verifying safe to move up trigger point injection surgery. Hilda LiasMarie verified if response is not received will reach out to Dr. Craige CottaKirby again, and will call patient by the end of the week to update on what she has found out.    Assessment: [facility], and patient awaiting response from Dr. Craige CottaKirby regarding her care.     Telephone Information:   Home Phone [phone]   Work Phone [phone]   Mobile [phone]   [facility] to leave detailed message.

## 2016-03-17 NOTE — Progress Notes (Signed)
HCSATS PROGRESS NOTE     VISIT INFORMATION   Location:  [facility]    Duration of session:  60 min    SUBJECTIVE   CC/Reason for referral:  Hx of SA  Relevant interval history:  n/a  Primary clinical model:  CPT    Homework/compliance with plan:  Yes  Current symptoms/complaints:  Anxiety, depression  Primary Clinical Targets:  PTS    Standardized Measure Scores:  n/a  Current session content:  Trauma Exposure and Processing    Brief description of interventions:  This therapist met with pt and reviewed HW 1.  Pt read impact statement in session.  This therapist completed CPT 2- introduced stuck points/ log and ABC worksheets.    OBJECTIVE   Patient presentation:  appropriate    ASSESSMENT/DIAGNOSIS   Primary diagnostic category: trauma and stressor-related disorders    Secondary diagnostic category: depressive disorders    Specific diagnosis and ICD-10 code:    (F43.10) PTSD (post-traumatic stress disorder)  (primary encounter diagnosis)      Update on progress:  Pt remains engaged and does not report any new concerns.     PLAN   Next session and plan:  7/21  Assigned homework:  CPT 2  Daily transaction log completed:  No  Further recommendations/consultations:  No    NOTES   none

## 2016-03-18 ENCOUNTER — Ambulatory Visit (HOSPITAL_BASED_OUTPATIENT_CLINIC_OR_DEPARTMENT_OTHER): Payer: No Typology Code available for payment source

## 2016-03-18 NOTE — Telephone Encounter (Signed)
Called pt back.    She is requesting pain medication.  2 weeks of pain anytime she sits down or lays flat on back.  Feels pressure on rectum.  Not sure if it's muscular.  Pain resolved but returned after intercourse.  Has tried ibuprofen which helps just a little.    Advised pt that she can take up to 800mg  ibuprofen qid if needed.  Try heating pad, warm baths; can go to Urgent Care if pain becomes intolerable.

## 2016-03-18 NOTE — Telephone Encounter (Signed)
I received her message. She does not need an urgent visit. I responded to her e-mail regarding her care plan, and she is having a cystoscopy 8/31.

## 2016-03-18 NOTE — Telephone Encounter (Signed)
Pt requesting a f/u appointment with Dr. Craige CottaKirby to review the next steps after her cysto.  Scheduled.

## 2016-03-18 NOTE — Telephone Encounter (Signed)
Concern: Patient experiencing a 10/10 pain, and has been for the past 2 weeks. Patient states ibuprofen, and other pain medications, are no longer working.   Additional Information: Patient requested to speak with nurse regarding pain management/prescription, however nursing staff assisting other patients at the time. Verified patient had community care line to speak with nurse 24/7. Advised patient to call 911 if pain is urgent enough.  Assessment: Patient requested to see provider prior to surgery on 8/31 and would like to speak to staff regarding pain management.    Telephone Information:   Home Phone [phone]   Work Phone [phone]   Mobile [phone]

## 2016-03-21 NOTE — Progress Notes (Signed)
HCSATS PROGRESS NOTE     VISIT INFORMATION   Location:  [facility]    Duration of session:  60 min    SUBJECTIVE   CC/Reason for referral:  Hx of SA  Relevant interval history:  n/a  Primary clinical model:  CPT    Homework/compliance with plan:  yes  Current symptoms/complaints:  Depression, sleep disturbances   Primary Clinical Targets:  PTS    Standardized Measure Scores:  n/a  Current session content:  Cognitive Restructuring    Brief description of interventions:  This therapist reviewed session 2 HW.  This therapist discussed ABC worksheets and answered remaining questions.  This therapist completed CPT 3 and discussed exposure HW.     OBJECTIVE   Patient presentation:  appropriate    ASSESSMENT/DIAGNOSIS   Primary diagnostic category: trauma and stressor-related disorders    Secondary diagnostic category: n/a    Specific diagnosis and ICD-10 code:    (F43.10) PTSD (post-traumatic stress disorder)  (primary encounter diagnosis)      Update on progress:  Pt remains engaged and was able to focus on HW over the past week.     PLAN   Next session and plan:  7/28  Assigned homework:  CPT 3  Daily transaction log completed:  No  Further recommendations/consultations:  No    NOTES   none

## 2016-03-22 ENCOUNTER — Encounter (INDEPENDENT_AMBULATORY_CARE_PROVIDER_SITE_OTHER): Payer: Self-pay | Admitting: Gastroenterology

## 2016-03-22 ENCOUNTER — Telehealth (INDEPENDENT_AMBULATORY_CARE_PROVIDER_SITE_OTHER): Payer: Self-pay | Admitting: Gastroenterology

## 2016-03-22 NOTE — Telephone Encounter (Signed)
Called patient and left a voicemail confirming Dr. Alvy BimlerKrishnamurthy's availability. Informed her that we still have her booked in our clinic on 09/29 and she is currently on the waitlist for that appointment. Invited patient to call back with any questions she might have.

## 2016-03-22 NOTE — Telephone Encounter (Signed)
(  TEXTING IS AN OPTION FOR [facility] ONLY)  Is this a [facility] clinic? No      RETURN CALL: General message OK      SUBJECT:  Appointment Request     REASON FOR REQUEST/SYMPTOMS: External Hemroids  REFERRING PROVIDER: self  REQUEST APPOINTMENT WITH: Regan LemmingShoba Krishmanurthy MD  REQUESTED DATE: Please contact patient, TIME: Please contact patient   UNABLE TO APPOINT BECAUSE: 04/07/16 was the first avaliable and she will be out of town, she is having some pain and would like to be seen sooner. Patient declined the wait list requested I sent a TE.  Thank you.

## 2016-03-23 ENCOUNTER — Ambulatory Visit (HOSPITAL_BASED_OUTPATIENT_CLINIC_OR_DEPARTMENT_OTHER): Payer: No Typology Code available for payment source | Admitting: Rehabilitative and Restorative Service Providers"

## 2016-03-23 ENCOUNTER — Ambulatory Visit (INDEPENDENT_AMBULATORY_CARE_PROVIDER_SITE_OTHER): Payer: No Typology Code available for payment source | Admitting: Family Medicine

## 2016-03-23 ENCOUNTER — Telehealth (INDEPENDENT_AMBULATORY_CARE_PROVIDER_SITE_OTHER): Payer: Self-pay | Admitting: Family Medicine

## 2016-03-23 ENCOUNTER — Encounter (INDEPENDENT_AMBULATORY_CARE_PROVIDER_SITE_OTHER): Payer: Self-pay | Admitting: Family Medicine

## 2016-03-23 VITALS — BP 129/80 | HR 97 | Temp 98.0°F | Resp 12 | Ht 62.99 in | Wt 254.0 lb

## 2016-03-23 DIAGNOSIS — M62838 Other muscle spasm: Secondary | ICD-10-CM

## 2016-03-23 DIAGNOSIS — R102 Pelvic and perineal pain: Secondary | ICD-10-CM

## 2016-03-23 DIAGNOSIS — R3911 Hesitancy of micturition: Secondary | ICD-10-CM

## 2016-03-23 DIAGNOSIS — N94810 Vulvar vestibulitis: Secondary | ICD-10-CM

## 2016-03-23 DIAGNOSIS — K6289 Other specified diseases of anus and rectum: Secondary | ICD-10-CM

## 2016-03-23 DIAGNOSIS — Z6841 Body Mass Index (BMI) 40.0 and over, adult: Secondary | ICD-10-CM

## 2016-03-23 MED ORDER — FLUOCINONIDE 0.05 % EX OINT
TOPICAL_OINTMENT | CUTANEOUS | 0 refills | Status: DC
Start: 2016-03-23 — End: 2016-04-25

## 2016-03-23 MED ORDER — HYDROCORTISONE ACETATE 25 MG RE SUPP
1.0000 | Freq: Two times a day (BID) | RECTAL | 0 refills | Status: DC | PRN
Start: 2016-03-23 — End: 2016-04-25

## 2016-03-23 NOTE — Patient Instructions (Signed)
It was a pleasure to see you in clinic today.                If you are not yet signed up for eCare, please see the below eCare section at the end of this document for how to enroll and to get your access codes.  It's easy to sign up at home today.    eCare  enrollment will allow you access to the below benefits   You can make appointments online   View test results / Lab Results   Request prescription renewals   Obtain a copy of our After Visit Summary (an electronic copy of this document)     Your Test Results:  If labs were ordered today the results are expected to be available via eCare in about 5 days. If you have an active eCare account, this is how we will notify you of your results.     If you do not have an eCare account then your test results will be mailed to you within about 14 days after your tests are completed. If your physician needs to change your care based on your results or is concerned, you should expect a phone call or eCare message from your provider.    If you have any questions about your test results please schedule an appointment with your provider, so that we may review them with you in greater detail.    **If it has been more than 2 weeks and you have not received your test results please send our office a message via eCare.    Medication Refills: If you need a prescription refilled, please contact your pharmacy 1 week before your current supply will run out to request the refill.  Contacting your pharmacy is the fastest and safest way to obtain a medication refill.  The pharmacy will notify our office.  Please note, that a minimum of 48 to 72 hours is needed to refill a medication,  Please call your pharmacy early to allow enough time to refill before you anticipate running out.  For faster medication refills, you can also schedule an appointment with your provider.    We know you have a choice in where you receive your healthcare and we sincerely thank you for trusting [facility] with your health.

## 2016-03-23 NOTE — Telephone Encounter (Signed)
(  TEXTING IS AN OPTION FOR [facility] ONLY)  Is this a [facility] clinic? Yes. What is the mobile number we can use to get a hold of you via text? [phone]      RETURN CALL: Detailed message on voicemail only      SUBJECT:  Medication Management/Questions     MEDICATION(S): unknown - patient was given medication today.   CONCERNS/QUESTIONS: she says that clinic she be receiving a rejection form for a prior authorization for medication. This form is being sent by her pharmacy.  ADDITIONAL INFORMATION: please see above message.

## 2016-03-23 NOTE — Telephone Encounter (Signed)
Routing to PA Pool.

## 2016-03-23 NOTE — Progress Notes (Signed)
Cc rectal pain    hpi   has been having some rectal pain for the last couple weeks.  It hurts to sit and there is some pain and also the area in the perianal area looks purple so she is concerned about hemorrhoid.  She would like something to look at it to make sure that's what it is.  She did see Dr. Johnsie CancelKrishnamurthy recently who did a rectal exam at that time and there was no hemorrhage at that time that she said the pain that was actually not as bad and she has seen a little bit of a blood about the size of a dime    She has no fever.    She stopped taking the fiber because he was making her go to bathroom very frequently like diarrhea.  And going to the bathroom to have bowel movement makes it more painful    O Blood pressure 129/80, pulse 97, temperature 98 F (36.7 C), temperature source Temporal, resp. rate 12, height 5' 2.99" (1.6 m), weight (!) 254 lb (115.2 kg), last menstrual period 02/15/2016, SpO2 98 %.    In nad  Rectal exam   Min ext hemorrhoid veins  Anoscope- some int purplish vessels suggestive of internal hemorrhoid  No mass    Ap 1. Rectal pain      Ddx hemorroid, fissure, proctalgia fugax    Will try steroid cream first  She said she is ok with other steroid creams    Just prednisone    And if not improving, to consider gen surg for scope  Or tx as proctalgia fugax with diltiazem, or NTG    - Fluocinonide 0.05 % External Ointment; Apply small amount rectally bid and after BM for 10 days and as needed  Dispense: 15 g; Refill: 0  - Hydrocortisone Acetate (ANUSOL-HC) 25 MG Rectal Suppos; Unwrap and insert 1 suppository rectally 2 times a day as needed.  Dispense: 24 suppository; Refill: 0

## 2016-03-23 NOTE — Progress Notes (Signed)
Physical Therapy Daily Note     The following identifiers were confirmed with the patient today: name and date of birth  Referring provider: Ailene RudAnna Catherine Kirby    Referring provider NPI: [id number]     Visit count: 4    Subjective/Pain:   Kristen Salinas reports compliance with home exercise program, but states that the exercises no longer provide short term relief. She states more pain around the anus now, and she is going to primary care provider office to have this assessed. She hasn't purchased the therawand yet.    Objective:  Skilled services/interventions provided:  Partner, [name], present during all but first 5' of visit.   Performed HEP to review mechanics and monitor for compensations.    Manual therapy:   -external levator ani soft tissue mobilization in side-lying     Neuromuscular Re-education:  -pelvic floor muscle awareness training through deep breathing, visualization, with biofeedback     -education in pelvic organ prolapse, rationale of downtraining vs strengthening, driver of pelvic pain, etc, answer patient's questions    Pre-treatment objective measures: more tenderness of levator ani on R than L, baseline with surface electromyography (sEMG) normal but pelvic floor muscle may be elevated (possibly functionally short pelvic floor muscles--difficult to determine secondary to body habitus)  Post-treatment objective measures: improved tissue tenderness after manual therapy    Assessment: patient does have short term improvement in symptoms and tension after manual therapy, which is positive. She hasn't been able to purchase therawand for self-treatment, but was instructed in use of small ball to massage pelvic floor muscle externally. The driver in her pelvic pain is likely mutlifactorial and difficult to determine, she states understanding of this, and I encouraged her to continue and work to optimize all aspects of her pelvic health.  Patient's response to therapy: states understanding.   Change in  impairments: improved pelvic floor muscle awareness.    Patient education/instruction: verbal instructions and demonstrations were provided for the above exercises  a handout was provided describing the exercises in written and picture format  the patient was able to return demonstrate the exercises accurately after instructions    Functional limitation(s) remaining requiring continued skilled services: difficulty with bladder and bowel emptying, pain with IC     HOME EXERCISE PROGRAM:    Access Code: [email or web address]   URL: [email or web address]   Date: 02/25/2016  Prepared by: Larkin InaNatasha Gia Lusher    Program Notes  1) Try lower abdominal massage daily and research dilators or therawand for home pelvic floor muscle massage  2) Use meds for tissue health  3) Bladder irritants--review list  4) Continue to use a good bladder emptying posture on toilet    Exercises  -Supine Diaphragmatic Breathing with Pelvic Floor Lengthening - 3x daily - 7x weekly  -PELVIC FLOOR MUSCLE awareness training when emptying bladder or bowel    GOALS:    1. Pelvic pain reduced by 75% or better by 05/25/2016.  2. Independent and proficient with home program components including therapeutic exercise and self care to manage condition by 05/25/2016.  3. Able to empty bladder (ease and lack of pain) at PRIOR LEVEL OF FUNCTION by 05/25/2016.    Plan: progress note. follow up on effect of self-massage; manual and possibly biofeedback for overall downtraining. Follow up on med use, pelvic floor muscle awareness. Consider abdominal strengthening.Trudie Reed.    Maple Odaniel L Antonino Nienhuis, PT

## 2016-03-23 NOTE — Progress Notes (Signed)
Reviewed eCare status with Patient:  YES    HEALTH MAINTENANCE:  Has the patient had any of these since their last visit?    Cervical screening/PAP: Not Due     Mammo: Not Due    Colon Screen: Not Due    Diabetic Eye Exam: Not Due      Have you seen a specialist since your last visit: No        HM Due:   Health Maintenance   Topic Date Due   . Influenza Vaccine (1) 05/01/2016   . Chlamydia Screen  10/22/2016   . Pap Smear  10/22/2016   . Gonorrhea Screen  10/22/2016   . Tetanus Vaccine  12/26/2022   . HIV Screen  Completed   . HPV Vaccine  Completed           Future Appointments  Date Time Provider Department Center   03/23/2016 3:15 PM Hsie, Lorenza Cambridgeadd Ta-Ching, MD [facility]   03/25/2016 11:00 AM Patrecia PourJarka, Myrta L, MSW [facility]   03/25/2016 1:00 PM Neale BurlyEckert, Linda F, MD [facility]    04/15/2016 12:45 PM Trudie ReedParman, Natasha L, PT [facility]   04/18/2016 10:20 AM Ailene RudKirby, Anna Catherine, MD [facility]   04/20/2016 12:00 PM Trudie ReedParman, Natasha L, PT [facility]   04/28/2016 11:00 AM Trudie ReedParman, Natasha L, PT [facility]   04/29/2016 1:15 PM Yates DecampKrishnamurthy, Shoba, MBBS [facility]   05/06/2016 12:45 PM Virgina NorfolkParman, Meryl DareNatasha L, PT [facility]   05/11/2016 11:15 AM Parman, Meryl DareNatasha L, PT [facility]

## 2016-03-24 MED ORDER — HYDROCORTISONE 2.5 % RE CREA
TOPICAL_CREAM | Freq: Four times a day (QID) | RECTAL | 1 refills | Status: AC
Start: 2016-03-24 — End: ?

## 2016-03-24 NOTE — Telephone Encounter (Signed)
Dr Franchot HeidelbergHsie,  Documentation about meds was placed in wrong encounter for this patient.     You said any topical would be OK.   Please send new script to pharmacy so they can run through insurance.     Thank you

## 2016-03-24 NOTE — Telephone Encounter (Addendum)
Spoke to [facility] Pharmacist   Suppositories are not covered on patients plan     Covered formularies are:    Anusol 2.5 Rectal cream in place of Anusol suppositories -  *Patient can get over the counter suppositories at her own cost if you recommend.     Fluocinonide 0.05% External ointment is not covered on patients plan.     Patient needs to try step therapy with   Triamcinolone Acetonide 0.025% Cream or Ointment.      Please review and send new scripts to pharmacy

## 2016-03-24 NOTE — Telephone Encounter (Signed)
Patient called and stated she will be in Surgery on August 31st. So she needs this medication to be handled quickly if possible. I told her that hopefully her Insurance company is cooperative.

## 2016-03-24 NOTE — Telephone Encounter (Signed)
Spoke to patient and she will pick up the anusol cream from the pharmacy.     Patient is questionning OTC suppository.   Is there one that you would recommend?     She said eCare communication is better for her

## 2016-03-24 NOTE — Telephone Encounter (Signed)
Ok to use any steroid cream that is covered  Reply to [place]

## 2016-03-24 NOTE — Telephone Encounter (Signed)
Pt calling, requesting to speak with Dot LanesKrista regarding suppository. States she spoke with insurance and there are a few covered options (did not provide rx names). Please call pt back to discuss at [phone].

## 2016-03-24 NOTE — Telephone Encounter (Signed)
Dot LanesKrista [facility] pharmacist earlier today and the said there were no covered suppositorys      Message already sent to Dr. Franchot HeidelbergHsie

## 2016-03-24 NOTE — Telephone Encounter (Signed)
Kristen SeltzerKrista Salinas, patient called again , are you working on replacement meds  with Dr. Franchot HeidelbergHsie?

## 2016-03-24 NOTE — Telephone Encounter (Signed)
So the options would be to use cream only,externally,  without suppository, or pay for them

## 2016-03-24 NOTE — Telephone Encounter (Signed)
Documentation regarding new scripts noted by PCP in wrong encounter     Documentation needs to be in TE from 03/23/16

## 2016-03-24 NOTE — Progress Notes (Signed)
HCSATS PROGRESS NOTE     VISIT INFORMATION   Location:  [facility]    Duration of session:  60 min    SUBJECTIVE   CC/Reason for referral:  Hx of SA  Relevant interval history:  Pt continues to address medical/ physical concerns   Primary clinical model:  CPT    Homework/compliance with plan:  Yes- pt completed exposure HW  Current symptoms/complaints:  Pt reports some physical discomfort and depression   Primary Clinical Targets:  PTS and Depression    Standardized Measure Scores:  n/a  Current session content:  Trauma Exposure and Processing    Brief description of interventions:  This therapist completed CPT session 4.  Pt read trauma account in session.  This therapist processed trauma account with pt. This therapist introduced CPT 4 HW.     OBJECTIVE   Patient presentation:  appropriate    ASSESSMENT/DIAGNOSIS   Primary diagnostic category: trauma and stressor-related disorders    Secondary diagnostic category: depressive disorders    Specific diagnosis and ICD-10 code:    (F43.10) PTSD (post-traumatic stress disorder)  (primary encounter diagnosis)      Update on progress:  Pt remains engaged.  Pt does report she still continues to struggle with her depression at times. Pt states that she will follow up with her doctor re: medication.     PLAN   Next session and plan:  8/4  Assigned homework:  CPT 4  Daily transaction log completed:  No  Further recommendations/consultations:  No    NOTES   none

## 2016-03-24 NOTE — Telephone Encounter (Signed)
No fax received yet.   Went ahead and submitted PA for Suppository first to [facility] as this patient will call clinic continuously to check status on PA.     PA submitted via [employer].  Will check for response in 24 Hours    [id number]

## 2016-03-25 ENCOUNTER — Ambulatory Visit (INDEPENDENT_AMBULATORY_CARE_PROVIDER_SITE_OTHER): Payer: No Typology Code available for payment source

## 2016-03-25 ENCOUNTER — Ambulatory Visit (HOSPITAL_BASED_OUTPATIENT_CLINIC_OR_DEPARTMENT_OTHER)
Payer: No Typology Code available for payment source | Attending: Obstetrics & Gynecology | Admitting: Obstetrics & Gynecology

## 2016-03-25 VITALS — BP 129/86 | HR 80 | Temp 97.9°F

## 2016-03-25 DIAGNOSIS — F4310 Post-traumatic stress disorder, unspecified: Secondary | ICD-10-CM

## 2016-03-25 DIAGNOSIS — N763 Subacute and chronic vulvitis: Secondary | ICD-10-CM | POA: Insufficient documentation

## 2016-03-25 LAB — PR WET MOUNTS INCL PREP VAGINAL CERV/SKIN SPECIMENS, ONSITE: PH, Wet Mount: 4.5 (ref 4.0–7.0)

## 2016-03-25 NOTE — Telephone Encounter (Signed)
Spoke to a [facility] rep again and she confirmed that Anucort Suppositories are not covered     Called patient and she will pick up script

## 2016-03-25 NOTE — Progress Notes (Signed)
I saw and evaluated the patient with Dr. Field I have reviewed the resident's documentation and agree with it. Waymon Laser, MD
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
## 2016-03-25 NOTE — Patient Instructions (Signed)
Thank you for coming to [facility] today!    Please call if you have questions or would like to make an appointment at [phone].    Please follow-up with in 1 month with Dr. Dolphus JennyEckert.

## 2016-03-29 ENCOUNTER — Encounter (INDEPENDENT_AMBULATORY_CARE_PROVIDER_SITE_OTHER): Payer: Self-pay | Admitting: Obstetrics/Gynecology

## 2016-03-29 ENCOUNTER — Ambulatory Visit
Admission: RE | Admit: 2016-03-29 | Discharge: 2016-03-29 | Disposition: A | Discharge status: Home / Self Care | Payer: No Typology Code available for payment source | Attending: Obstetrics/Gynecology | Admitting: Obstetrics/Gynecology

## 2016-03-29 ENCOUNTER — Ambulatory Visit (HOSPITAL_BASED_OUTPATIENT_CLINIC_OR_DEPARTMENT_OTHER): Payer: No Typology Code available for payment source | Admitting: Obstetrics/Gynecology

## 2016-03-29 ENCOUNTER — Telehealth (HOSPITAL_BASED_OUTPATIENT_CLINIC_OR_DEPARTMENT_OTHER): Payer: Self-pay | Admitting: Urology

## 2016-03-29 DIAGNOSIS — R3989 Other symptoms and signs involving the genitourinary system: Secondary | ICD-10-CM

## 2016-03-29 DIAGNOSIS — F329 Major depressive disorder, single episode, unspecified: Secondary | ICD-10-CM | POA: Insufficient documentation

## 2016-03-29 DIAGNOSIS — E669 Obesity, unspecified: Secondary | ICD-10-CM | POA: Insufficient documentation

## 2016-03-29 DIAGNOSIS — M62838 Other muscle spasm: Secondary | ICD-10-CM

## 2016-03-29 DIAGNOSIS — Z6841 Body Mass Index (BMI) 40.0 and over, adult: Secondary | ICD-10-CM | POA: Insufficient documentation

## 2016-03-29 DIAGNOSIS — N94810 Vulvar vestibulitis: Secondary | ICD-10-CM

## 2016-03-29 DIAGNOSIS — N3289 Other specified disorders of bladder: Secondary | ICD-10-CM | POA: Insufficient documentation

## 2016-03-29 DIAGNOSIS — Z87891 Personal history of nicotine dependence: Secondary | ICD-10-CM | POA: Insufficient documentation

## 2016-03-29 LAB — GLUCOSE POC, ~~LOC~~: Glucose (POC): 96 mg/dL (ref 62–125)

## 2016-03-29 NOTE — Telephone Encounter (Signed)
Pt cld to inform Kristen Salinas that she had her cysto done at another providers ofc today. Just FYI, if need can cl ph number as listed.

## 2016-03-31 ENCOUNTER — Encounter (HOSPITAL_BASED_OUTPATIENT_CLINIC_OR_DEPARTMENT_OTHER): Payer: Self-pay | Admitting: Rehabilitative and Restorative Service Providers"

## 2016-04-01 ENCOUNTER — Telehealth (HOSPITAL_BASED_OUTPATIENT_CLINIC_OR_DEPARTMENT_OTHER): Payer: Self-pay

## 2016-04-01 ENCOUNTER — Ambulatory Visit (HOSPITAL_BASED_OUTPATIENT_CLINIC_OR_DEPARTMENT_OTHER): Payer: No Typology Code available for payment source

## 2016-04-01 ENCOUNTER — Encounter (INDEPENDENT_AMBULATORY_CARE_PROVIDER_SITE_OTHER): Payer: Self-pay | Admitting: Nurse Practitioner

## 2016-04-01 ENCOUNTER — Ambulatory Visit (INDEPENDENT_AMBULATORY_CARE_PROVIDER_SITE_OTHER): Payer: No Typology Code available for payment source | Admitting: Nurse Practitioner

## 2016-04-01 VITALS — BP 129/86 | HR 102 | Temp 98.2°F | Resp 20 | Wt 250.0 lb

## 2016-04-01 DIAGNOSIS — K6289 Other specified diseases of anus and rectum: Secondary | ICD-10-CM

## 2016-04-01 DIAGNOSIS — F4310 Post-traumatic stress disorder, unspecified: Secondary | ICD-10-CM

## 2016-04-01 DIAGNOSIS — Z6841 Body Mass Index (BMI) 40.0 and over, adult: Secondary | ICD-10-CM

## 2016-04-01 DIAGNOSIS — G8929 Other chronic pain: Secondary | ICD-10-CM

## 2016-04-01 MED ORDER — METHOCARBAMOL 500 MG OR TABS
500.0000 mg | ORAL_TABLET | Freq: Four times a day (QID) | ORAL | 0 refills | Status: AC | PRN
Start: 2016-04-01 — End: 2016-04-08

## 2016-04-01 NOTE — Progress Notes (Signed)
Patient Referred By: No ref. provider found  Patient's PCP: Kristen Byars, MD     Subjective:  Patient is a 25 year old female, here to discuss Rectal Pain (Pt has had rectal pain/pressure x 1 month, was seen in [facility] clinic on 8/23, tx with supository and ointment, worsening pain since then//[facility])    The following portions of the patient's history were reviewed with the patient and updated as appropriate: past medical history, past surgical history and past family history.    HPI  Patient is currently being evaluated and treated for pelvic floor pain as well as rectal pain.  In the last month she has been seen by GI and her primary care physician for rectal pain.  Her last visit with primary care was on August 23 wish she was treated with hydrocortisone cream and suppositories for her pain.  She is also supposed to be taking fiber to keep her bowel movements soft.  Patient presents to urgent care stating that her rectal pain persists and is worsening over the last 2 days.  She describes the pain as cramp like and on the slightest spasm.  Last night the pain was 10 over 10.  She has been having bowel movements daily which she said this are soft in nature.  Bowel movements do not make the pain necessarily worse.  Pain worsens when she sits for long periods of time.  She has noted intermittent blood on the toilet paper as well as intermittent blood in the toilet bowl last time she noted blood was about 3 days ago.  She denies nausea or vomiting and has not noted weight loss.  She does not have abdominal pain with rectal pain.  Denies fever or chills.    Review of Systems   Constitutional: Negative for activity change, chills, fatigue and fever.   HENT: Negative for congestion.    Respiratory: Negative for cough and shortness of breath.    Cardiovascular: Negative for chest pain and palpitations.   Gastrointestinal: Positive for blood in stool and rectal pain. Negative for abdominal distention, abdominal pain,  constipation, diarrhea, nausea and vomiting.   Genitourinary: Positive for pelvic pain. Negative for difficulty urinating and dysuria.   Skin: Negative for rash.   Allergic/Immunologic: Negative for environmental allergies.   All other systems reviewed and are negative.        Objective:  Physical Exam   Constitutional: She is oriented to person, place, and time. She appears well-developed and well-nourished. No distress.   Patient sitting comfortably on exam table in no acute distress.   HENT:   Head: Normocephalic.   Neck: Neck supple.   Cardiovascular: Normal rate, regular rhythm and normal heart sounds.    Pulmonary/Chest: Effort normal and breath sounds normal. No respiratory distress.   Abdominal: Soft. Normal appearance and bowel sounds are normal. There is no hepatosplenomegaly. There is no tenderness. There is no CVA tenderness, no tenderness at McBurney's point and negative Murphy's sign.   Genitourinary: Rectal exam shows no external hemorrhoid, no internal hemorrhoid, no fissure, no tenderness, anal tone normal and guaiac negative stool.   Genitourinary Comments: Rectal exam completed.  No external hemorrhoids noted.  Rectum does not appear tender to palpation.  There is no external erythema or evidence of perirectal abscess noted.  Anoscopy was done by this provider no large internal hemorrhoids were noted although some varicosities noted on the lower wall of the rectum.  There is stool in the vault fits brown in color.  Guaiac was obtained and is negative.   Lymphadenopathy:     She has no cervical adenopathy.   Neurological: She is alert and oriented to person, place, and time.   Skin: Skin is warm and dry. No rash noted. She is not diaphoretic.   Vitals reviewed.       Assessment and Plan:   Diagnoses and all orders for this visit:    Rectal pain, chronic  -     Methocarbamol 500 MG Oral Tab; Take 1-2 tablets (500-1,000 mg) by mouth every 6 hours as needed for muscle spasms for up to 7 days.      rectal pain that seems to be chronic in nature.  Patient has been seen by both GI and her primary care for this pain.  She is also currently being seen by GYN for vaginal floor problems.  Patient states the pain as spasm-like so I did suggest that she can try Robaxin when she has pain to see if this helps.  Patient asked if this was for pain and asked if it was oxycodone and I told her no she replied that she needed something stronger than oxycodone for pain.  Explained to patient that urgent care does not provide narcotics for pain and with her history of constipation in the past narcotic pain medications should not be an option cassette could increase constipation.  I again suggested she try Robaxin as well as sitz baths.  She can continue her anal cream and anal suppositories as she feels necessary.  I stressed the important the patient follow-up with her primary care physician who may have other suggestions for her.  She is currently awaiting an appointment with GI and is on the wait list for cancellation.

## 2016-04-01 NOTE — Telephone Encounter (Signed)
Pt asking to speak with Judeth CornfieldStephanie, MA in regards to her vaginal pain. Please contact patient to advise/assist.

## 2016-04-01 NOTE — Patient Instructions (Signed)
Please follow up with your PCP for further evaluation   Please follow with your specialists as soon as possible

## 2016-04-02 ENCOUNTER — Encounter (INDEPENDENT_AMBULATORY_CARE_PROVIDER_SITE_OTHER): Payer: Self-pay | Admitting: Obstetrics/Gynecology

## 2016-04-05 NOTE — Telephone Encounter (Signed)
Ret pt cl and informed no sooner appts per Baltazar Najjar.  WIll keep sch as is. Closing encounter.

## 2016-04-07 ENCOUNTER — Telehealth (INDEPENDENT_AMBULATORY_CARE_PROVIDER_SITE_OTHER): Payer: Self-pay | Admitting: Gastroenterology

## 2016-04-07 NOTE — Telephone Encounter (Signed)
Kristen Salinas is a patient with chronic rectal pain.  She states that her pain has been so severe that she had to seek urgent [facility] on 04/01/16.  She has an appointment pending with Dr. Johnsie CancelKrishnamurthy on 9/29.  She is also scheduled to see Dr. Craige CottaKirby in urogynecology on 9/18.  Not sure what she can do for pain management in the interim or if we should facilitate a sooner appointment.    Please contact at [phone] to discuss.  Thank you.

## 2016-04-08 ENCOUNTER — Other Ambulatory Visit (INDEPENDENT_AMBULATORY_CARE_PROVIDER_SITE_OTHER): Payer: Self-pay | Admitting: Gastroenterology

## 2016-04-08 ENCOUNTER — Ambulatory Visit (HOSPITAL_BASED_OUTPATIENT_CLINIC_OR_DEPARTMENT_OTHER): Payer: No Typology Code available for payment source

## 2016-04-08 DIAGNOSIS — K6289 Other specified diseases of anus and rectum: Secondary | ICD-10-CM

## 2016-04-08 MED ORDER — LIDOCAINE-HYDROCORTISONE ACE 3-0.5 % RE CREA
1.0000 mL | TOPICAL_CREAM | Freq: Three times a day (TID) | RECTAL | 2 refills | Status: AC | PRN
Start: 2016-04-08 — End: 2016-05-08

## 2016-04-08 NOTE — Telephone Encounter (Signed)
S: Pt is calling to discuss her rectal pain.    B: This rectal pain has been an ongoing problem.   The pain and pressure in the rectal area have been worse since her last visit with Dr. Alwyn PeaKridhnamurthy on 8/11.    She reports some slight bleeding, usually with BM's. It occurs generally QOD.   She gets some short lived relief of pain after a BM.   She reports she generally is having diarrhea.     She has tried sitz baths, suppositories, external cream and none of those have helped.  She tried Heather's fiber and it gave her diarrhea. She stopped that and is on 800 mg of mg and still has diarrheal     Her last colo was two years ago. She thinks that given her worsening symptoms, she needs another.    A: Pt with worsening rectal pain. She wonders what she can do while waiting for her appointment, or if she can be seen sooner.    R: To Dr. Johnsie CancelKrishnamurthy for review

## 2016-04-08 NOTE — Telephone Encounter (Signed)
See Dr. Alvy BimlerKrishnamurthy's routing notes with recommendations. These were discussed with the patient.     She will follow the recommendations given and will let us know how she does.

## 2016-04-12 ENCOUNTER — Telehealth (INDEPENDENT_AMBULATORY_CARE_PROVIDER_SITE_OTHER): Payer: Self-pay | Admitting: Gastroenterology

## 2016-04-12 NOTE — Telephone Encounter (Signed)
Received fax from pharmacy. Lidocaine cream is not covered--PA required.    Kristen Salinas form filled out and faxed to [phone]

## 2016-04-14 ENCOUNTER — Other Ambulatory Visit (INDEPENDENT_AMBULATORY_CARE_PROVIDER_SITE_OTHER): Payer: Self-pay | Admitting: Gastroenterology

## 2016-04-14 ENCOUNTER — Encounter (HOSPITAL_BASED_OUTPATIENT_CLINIC_OR_DEPARTMENT_OTHER): Payer: Self-pay | Admitting: Obstetrics/Gynecology

## 2016-04-14 DIAGNOSIS — K6289 Other specified diseases of anus and rectum: Secondary | ICD-10-CM

## 2016-04-15 ENCOUNTER — Ambulatory Visit (HOSPITAL_BASED_OUTPATIENT_CLINIC_OR_DEPARTMENT_OTHER): Payer: No Typology Code available for payment source

## 2016-04-15 ENCOUNTER — Other Ambulatory Visit (INDEPENDENT_AMBULATORY_CARE_PROVIDER_SITE_OTHER): Payer: Self-pay

## 2016-04-15 ENCOUNTER — Other Ambulatory Visit
Admit: 2016-04-15 | Discharge: 2016-04-15 | Disposition: A | Discharge status: Home / Self Care | Payer: No Typology Code available for payment source | Attending: Obstetrics/Gynecology | Admitting: Obstetrics/Gynecology

## 2016-04-15 ENCOUNTER — Ambulatory Visit
Payer: No Typology Code available for payment source | Attending: Obstetrics/Gynecology | Admitting: Rehabilitative and Restorative Service Providers"

## 2016-04-15 ENCOUNTER — Encounter (INDEPENDENT_AMBULATORY_CARE_PROVIDER_SITE_OTHER): Payer: Self-pay | Admitting: Gastroenterology

## 2016-04-15 DIAGNOSIS — R3911 Hesitancy of micturition: Secondary | ICD-10-CM

## 2016-04-15 DIAGNOSIS — R102 Pelvic and perineal pain: Secondary | ICD-10-CM | POA: Insufficient documentation

## 2016-04-15 DIAGNOSIS — R300 Dysuria: Secondary | ICD-10-CM | POA: Insufficient documentation

## 2016-04-15 DIAGNOSIS — F4310 Post-traumatic stress disorder, unspecified: Secondary | ICD-10-CM

## 2016-04-15 DIAGNOSIS — M62838 Other muscle spasm: Secondary | ICD-10-CM | POA: Insufficient documentation

## 2016-04-15 LAB — URINALYSIS COMPLETE, URN
Bacteria, URN: NONE SEEN
Bilirubin (Qual), URN: NEGATIVE
Epith Cells_Renal/Trans,URN: NEGATIVE /HPF
Glucose Qual, URN: NEGATIVE mg/dL
Ketones, URN: 5 mg/dL — AB
Leukocyte Esterase, URN: NEGATIVE
Nitrite, URN: NEGATIVE
Protein (Alb Semiquant), URN: NEGATIVE mg/dL
RBC, URN: NEGATIVE /HPF
Specific Gravity, URN: 1.01 g/mL (ref 1.002–1.027)
WBC, URN: NEGATIVE /HPF
pH, URN: 5.5 (ref 5.0–8.0)

## 2016-04-15 NOTE — Progress Notes (Signed)
Physical Therapy Daily Note     The following identifiers were confirmed with the patient today: name and date of birth  Referring provider: Jerelyn Charles    Referring provider NPI: [id number]     Visit count: 5    Subjective/Pain:   Kristen Salinas reports symptoms worsening since she was in last (approx 3 weeks ago). She has had a number of tests/procedures for her pelvic pain, including bladder and rectal pain. She has also had some medication changes. She has been doing the breathing and had her partner, Octavia Bruckner, help with massage, and both provide short term relief. She has received her therawand but hasn't used it yet. She went to urgent care for her pain a few weeks ago, received muscle relaxants, and states that her symptoms did improve with this. She has since run out of this Rx. At some point, she did some kegels and noticed her slow of urine slow down. I am not sure why she did these kegels, as I have encouraged her not to do so. That said, she did stopped the kegels and her urine speed has improved (but still slower than prior level of function). She did have intercourse a few weeks ago, and with orgasm, had significant increased pain.     Objective:  Skilled services/interventions provided:  Partner, [name], present during all of visit.    Manual therapy:   -external levator ani soft tissue mobilization in prone  -gluteal soft tissue mobilization in prone    Neuromuscular Re-education:  -pelvic floor muscle awareness and diaphragmatic breathing in childs pose    Pre-treatment objective measures: increased gluteal tension observed in side-lying and prone  Post-treatment objective measures: decreased, but still elevated tension    Assessment: patient does have short term improvement in symptoms and tension after manual therapy, which is positive. 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  The driver in her pelvic pain is likely mutlifactorial and difficult to determine, she states understanding of this, and I encouraged her to continue and work to optimize all aspects of her pelvic health.  Patient's response to therapy: states understanding.   Change in impairments: improved pelvic floor muscle awareness.    Patient education/instruction: verbal instructions and demonstrations were provided for the above exercises  a handout was provided describing the exercises in written and picture format  the patient was able to return demonstrate the exercises accurately after instructions    Functional limitation(s) remaining requiring continued skilled services: difficulty with bladder and bowel emptying, pain with sexual activity    HOME EXERCISE PROGRAM:    Access Code: [email or web address]   URL: [email or web address]   Date: 02/25/2016  Prepared by: Mateo Flow    Program Notes  Work on decreasing gripping/tension of gluteal muscles  Massage by partner to help decrease tone of glutes and pelvic floor muscles  Childs pose breathing    Exercises  -Supine Diaphragmatic Breathing with Pelvic Floor Lengthening - 3x daily - 7x weekly  -PELVIC FLOOR MUSCLE awareness training (focus on relaxation) when emptying bladder or bowel    GOALS:    1. Pelvic pain reduced by 75% or better by 05/25/2016. (not met, GROC scale: -7)  2. Independent and proficient with home program components including therapeutic exercise and self care to manage condition by 05/25/2016. (inconsistent)  3. Able to empty bladder (ease and lack of pain) at [place]  by 05/25/2016. (worse than baseline)    Plan: follow up on massage and stretching/downtraining efforts, continue to work to reduce pelvic floor muscle tension as able.    Stevan Born, PT

## 2016-04-17 LAB — URINE C/S: Colony Count: 4000

## 2016-04-18 ENCOUNTER — Encounter (INDEPENDENT_AMBULATORY_CARE_PROVIDER_SITE_OTHER): Payer: Self-pay | Admitting: Obstetrics/Gynecology

## 2016-04-18 ENCOUNTER — Ambulatory Visit: Payer: No Typology Code available for payment source | Admitting: Obstetrics/Gynecology

## 2016-04-18 VITALS — BP 137/86 | HR 98 | Wt 256.2 lb

## 2016-04-18 DIAGNOSIS — R3989 Other symptoms and signs involving the genitourinary system: Secondary | ICD-10-CM

## 2016-04-18 DIAGNOSIS — M62838 Other muscle spasm: Secondary | ICD-10-CM

## 2016-04-18 DIAGNOSIS — Z6841 Body Mass Index (BMI) 40.0 and over, adult: Secondary | ICD-10-CM

## 2016-04-18 DIAGNOSIS — R102 Pelvic and perineal pain: Secondary | ICD-10-CM
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
## 2016-04-18 NOTE — Patient Instructions (Signed)
Work on pain control with Dr. Dolphus JennyEckert.    Work with mental health therapist.    Work with pelvic floor PT.    I do not recommend more pelvic floor injections with marcaine and kenalog.    If symptoms do not improve, we could consider pelvic floor botox (discussed risks and benefits today).

## 2016-04-19 ENCOUNTER — Encounter (HOSPITAL_BASED_OUTPATIENT_CLINIC_OR_DEPARTMENT_OTHER): Payer: No Typology Code available for payment source | Admitting: Urology

## 2016-04-19 ENCOUNTER — Telehealth (INDEPENDENT_AMBULATORY_CARE_PROVIDER_SITE_OTHER): Payer: Self-pay | Admitting: Obstetrics/Gynecology

## 2016-04-19 ENCOUNTER — Other Ambulatory Visit (HOSPITAL_BASED_OUTPATIENT_CLINIC_OR_DEPARTMENT_OTHER): Payer: Self-pay | Admitting: Gastroenterology

## 2016-04-19 DIAGNOSIS — K5900 Constipation, unspecified: Secondary | ICD-10-CM

## 2016-04-19 DIAGNOSIS — K921 Melena: Secondary | ICD-10-CM

## 2016-04-19 NOTE — Telephone Encounter (Signed)
-----   Message from Andris BaumannStephanie L Caton, [place]MA sent at 04/19/2016  9:38 AM PDT -----  Please see Dr. Evlyn CourierKirby's message below     ----- Message -----  From: Ailene RudKirby, Anna Catherine, MD  Sent: 04/15/2016   2:08 PM  To: , #    Could you please let this patient know she does not appear to have a UTI?    Thank you.    [name]      ----- Message -----  From: Christain SacramentoUw Lab Results Interface, User  Sent: 04/15/2016  11:46 AM  To: Ailene RudAnna Catherine Kirby, MD

## 2016-04-20 ENCOUNTER — Encounter (INDEPENDENT_AMBULATORY_CARE_PROVIDER_SITE_OTHER): Payer: Self-pay

## 2016-04-20 ENCOUNTER — Ambulatory Visit (HOSPITAL_BASED_OUTPATIENT_CLINIC_OR_DEPARTMENT_OTHER): Payer: No Typology Code available for payment source | Admitting: Rehabilitative and Restorative Service Providers"

## 2016-04-20 ENCOUNTER — Ambulatory Visit (HOSPITAL_BASED_OUTPATIENT_CLINIC_OR_DEPARTMENT_OTHER)
Payer: No Typology Code available for payment source | Attending: Obstetrics/Gynecology | Admitting: Obstetrics/Gynecology

## 2016-04-20 VITALS — BP 133/88 | HR 87 | Temp 97.5°F

## 2016-04-20 DIAGNOSIS — R3911 Hesitancy of micturition: Secondary | ICD-10-CM

## 2016-04-20 DIAGNOSIS — E282 Polycystic ovarian syndrome: Secondary | ICD-10-CM | POA: Insufficient documentation

## 2016-04-20 DIAGNOSIS — R102 Pelvic and perineal pain: Secondary | ICD-10-CM | POA: Insufficient documentation

## 2016-04-20 DIAGNOSIS — Z23 Encounter for immunization: Secondary | ICD-10-CM | POA: Insufficient documentation

## 2016-04-20 DIAGNOSIS — N939 Abnormal uterine and vaginal bleeding, unspecified: Secondary | ICD-10-CM | POA: Insufficient documentation

## 2016-04-20 DIAGNOSIS — N94810 Vulvar vestibulitis: Secondary | ICD-10-CM | POA: Insufficient documentation

## 2016-04-20 DIAGNOSIS — Z975 Presence of (intrauterine) contraceptive device: Secondary | ICD-10-CM | POA: Insufficient documentation

## 2016-04-20 DIAGNOSIS — M62838 Other muscle spasm: Secondary | ICD-10-CM

## 2016-04-20 MED ORDER — METFORMIN HCL ER 500 MG OR TB24
500.0000 mg | EXTENDED_RELEASE_TABLET | Freq: Every day | ORAL | 2 refills | Status: DC
Start: 2016-04-20 — End: 2016-06-06

## 2016-04-20 NOTE — Progress Notes (Signed)
BP 133/88   Pulse 87   Temp 97.5 F (36.4 C) (Temporal)   Dictation #1  [id number]  [id number]:[phone]        Kristen BridgemanJessica Nicole Salinas is a 25 year old female who requests Nexplanon removal. Her Nexplanon was placed on 12/16 in her left arm.    Patient is clear about her decision for removal, and understands fertility may immediately return upon removal.  Is on OCP's    PROCEDURE NOTE: Nexplanon (etonogestrel implant) Removal    Indication:  Abnormal bleeding  Allergies:  Prednisone  Allergy to anesthetics, latex, chlorhexidine: No    Correct Patient using  2 PATIENT IDENTIFIERS YES   Correct SIDE marked YES   Correct SITE marked YES   Correct PROCEDURE YES   Correct EQUIPMENT and EQUIPMENT SETTINGS YES   Completed CONSENT YES, Date: 04/20/2016         The patient was placed in supine position with her left arm flexed at the elbow and externally rotated, with her hand next to her head.  The current rod was identified, its distal tip at 6-8 cm above the elbow crease at the inner side of the upper arm overlying the groove between the biceps and the triceps muscles; distal tip was marked with a sterile marker.    The area was prepared with chlorhexidine gluconate (Hibiclens). A 30 g needle was used to inject a small amount of 1% lidocaine just under the distal tip.  A vertical 2-3 mm incision was made with a #11 scalpel over the distal tip, pressure downward was placed on the proximal end of the rod  to bring the distal tip close to the surface, and the rod was removed with a hemostat. Minimal dissection was needed to remove the rod from its surrounding fibrous tissue.   The rod was examined and is intact. The area was cleansed with alcohol. A pressure bandage was applied.    Complications: none  Procedure pe  ormed by Attending physician  Nexplanon was removed from patient's medication list: Yes    The patient tolerated the procedure well.  She was instructed to keep the area clean and dry, to remove the pressure  bandage in 24 hours.    Discussed that fertility can resume immediately, and an alternative contraception method is needed if desired.

## 2016-04-20 NOTE — Telephone Encounter (Signed)
Sent pt e-Care message.

## 2016-04-20 NOTE — Patient Instructions (Signed)
Keep bandage on x 24 hours.  Okay to use ice on top of bandage    Can use Selsun Blue shampoo to spots on back 5 min before shower (letting shampoo dry) and then wash off    Will use the 2% gabapentin 2% amitryptilene 2% baclofen in compounded ointment daily to pelvic floor    Keep taking the oral contraceptives as directed

## 2016-04-20 NOTE — Progress Notes (Signed)
Physical Therapy Daily Note     The following identifiers were confirmed with the patient today: name and date of birth  Referring provider: Jerelyn Charles    Referring provider NPI: [id number]     Visit count: 6    Subjective/Pain:   Kristen Salinas reports symptoms mostly stable or a little improved since last week. She had a visit with urogyn on Monday, no infection, no prolapse, recommended to cont with physical therapy. Botox injections would be last-line treatment. Muscles haven't been as tight since last visit. She is getting her birth control implant removed today.    Objective:  Skilled services/interventions provided:  Partner, [name], present during all of visit.    Manual therapy:   -internal levator ani and 1st/2nd layer of pelvic floor muscle soft tissue mobilization hooklying  -introital stretching on L    Therapeutic exercise:  -lower trunk rotation 1'  -supine piriformis stretch 30"x2 bilateral     Pre-treatment objective measures: 3rd layer pelvic floor muscle normal tone, 1st/2nd layer elevated tension   Post-treatment objective measures: improved muscle tension after manual therapy    Assessment: patient does have short term improvement in symptoms and tension after manual therapy, which is positive. 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 The driver in her pelvic pain is likely mutlifactorial and difficult to determine, she states understanding of this, and I encouraged her to continue and work to optimize all aspects of her pelvic health.  Patient's response to therapy: states understanding.   Change in impairments: improved pelvic floor muscle awareness.    Patient education/instruction: verbal instructions and demonstrations were provided for the above exercises  a handout was provided describing the exercises in written and picture format  the patient was able to  return demonstrate the exercises accurately after instructions   -asked to resume external, 1st and 2nd layer pelvic floor muscle massage at home    Functional limitation(s) remaining requiring continued skilled services: difficulty with bladder and bowel emptying, pain with sexual activity    HOME EXERCISE PROGRAM:    Access Code: [email or web address]   URL: [email or web address]   Date: 02/25/2016  Prepared by: Mateo Flow    Program Notes  Work on decreasing gripping/tension of gluteal muscles  Massage by partner to help decrease tone of glutes and pelvic floor muscles  Childs pose breathing  Piriformis stretch, LTR     Exercises  -Supine Diaphragmatic Breathing with Pelvic Floor Lengthening - 3x daily - 7x weekly  -PELVIC FLOOR MUSCLE awareness training (focus on relaxation) when emptying bladder or bowel    GOALS:    1. Pelvic pain reduced by 75% or better by 05/25/2016. (not met, GROC scale: -7)  2. Independent and proficient with home program components including therapeutic exercise and self care to manage condition by 05/25/2016. (inconsistent)  3. Able to empty bladder (ease and lack of pain) at [place] by 05/25/2016. (worse than baseline)    Plan: follow up on massage and stretching/downtraining efforts, continue to work to reduce pelvic floor muscle tension as able. If no progress in another 3-4 visits, discharge with updated poc.     Stevan Born, PT

## 2016-04-21 ENCOUNTER — Ambulatory Visit (INDEPENDENT_AMBULATORY_CARE_PROVIDER_SITE_OTHER): Payer: No Typology Code available for payment source | Admitting: Physician Assistant

## 2016-04-21 ENCOUNTER — Ambulatory Visit: Payer: Self-pay

## 2016-04-21 ENCOUNTER — Encounter (INDEPENDENT_AMBULATORY_CARE_PROVIDER_SITE_OTHER): Payer: Self-pay | Admitting: Nurse Practitioner

## 2016-04-21 ENCOUNTER — Ambulatory Visit (INDEPENDENT_AMBULATORY_CARE_PROVIDER_SITE_OTHER): Payer: No Typology Code available for payment source | Admitting: Nurse Practitioner

## 2016-04-21 VITALS — BP 130/85 | HR 91 | Temp 98.2°F | Resp 18 | Wt 256.0 lb

## 2016-04-21 DIAGNOSIS — Z6841 Body Mass Index (BMI) 40.0 and over, adult: Secondary | ICD-10-CM

## 2016-04-21 DIAGNOSIS — H5711 Ocular pain, right eye: Secondary | ICD-10-CM

## 2016-04-21 MED ORDER — KETOROLAC TROMETHAMINE 30 MG/ML IJ SOLN
60.0000 mg | Freq: Once | INTRAMUSCULAR | Status: AC
Start: 2016-04-21 — End: 2016-04-21
  Administered 2016-04-21: 60 mg via INTRAMUSCULAR

## 2016-04-21 NOTE — Patient Instructions (Signed)
Warm soaks to face   Ibuprofen for pain   For worsening swelling, pain or fever return to the clinic at once.

## 2016-04-21 NOTE — Progress Notes (Signed)
Gave pt 60mg ketorolac by injection. Pt tolerated well.  Adonnis Salceda [place], CMA

## 2016-04-21 NOTE — Progress Notes (Signed)
Patient Referred By: No ref. provider found  Patient's PCP: Cecelia ByarsAnderson, Sandra Jean, MD     Subjective:  Patient is a 25 year old female, here to discuss Facial Pain (Pt woke this morning with R facial pain, possible swelling, now it is becoming painful to swallow, no trama, nexplinon was removed yesterday and pt received flu vacc//[facility])    The following portions of the patient's history were reviewed with the patient and updated as appropriate: past medical history, past surgical history and past family history.    States woke up this morning with pain to the right eye. Denies trauma. Now complaining of sore throat and swelling to the right side of the face. Denies fever or chills.       Eye Pain    The right eye is affected. This is a new problem. The current episode started today. The problem occurs constantly. The problem has been unchanged. There was no injury mechanism. The pain is at a severity of 5/10 (throbbing ). The pain is moderate. There is no known exposure to pink eye. She does not wear contacts. Pertinent negatives include no blurred vision, eye discharge, double vision, eye redness, fever, foreign body sensation, itching, nausea, photophobia or vomiting. She has tried nothing for the symptoms.       Review of Systems   Constitutional: Negative for activity change, chills, diaphoresis and fever.   HENT: Positive for facial swelling and sore throat. Negative for congestion, dental problem, ear discharge, ear pain, hearing loss, postnasal drip, rhinorrhea and sinus pain.    Eyes: Positive for pain. Negative for blurred vision, double vision, photophobia, discharge, redness and itching.   Respiratory: Negative for cough and shortness of breath.    Cardiovascular: Negative for chest pain.   Gastrointestinal: Negative for nausea and vomiting.   Skin: Negative for rash.   Allergic/Immunologic: Negative for environmental allergies and food allergies.         Objective:  BP 130/85   Pulse 91   Temp 98.2 F  (36.8 C) (Temporal)   Resp 18   Wt (!) 256 lb (116.1 kg)   SpO2 98%   BMI 45.36 kg/m    Physical Exam   Constitutional: She is oriented to person, place, and time. She appears well-developed and well-nourished. No distress.   HENT:   Head: Normocephalic and atraumatic.   Right Ear: Tympanic membrane and ear canal normal.   Left Ear: Tympanic membrane and ear canal normal.   Mouth/Throat: Uvula is midline, oropharynx is clear and moist and mucous membranes are normal. No oral lesions. Normal dentition. No dental caries. Tonsils are 1+ on the right. Tonsils are 1+ on the left. No tonsillar exudate.   Mild edema noted on the right side of the face over the zygomatic arch.    Eyes: Conjunctivae and EOM are normal. Pupils are equal, round, and reactive to light. Right eye exhibits no discharge. Left eye exhibits no discharge. No scleral icterus.   No palpable peri orbital tenderness bilaterally   Neck: Normal range of motion. Neck supple.   Cardiovascular: Normal rate, regular rhythm and normal heart sounds.    Pulmonary/Chest: Effort normal and breath sounds normal. No stridor. No respiratory distress. She has no wheezes.   Neurological: She is alert and oriented to person, place, and time. She has normal strength. No sensory deficit.   Skin: Skin is warm and dry. She is not diaphoretic. No erythema.   Psychiatric: She has a normal mood and affect.  Vitals reviewed.    Course:  Given 60 mg of Toradol IM times one in the clinic. Post injection felt a little better with less pain      Assessment and Plan:   Diagnoses and all orders for this visit:    Eye pain, right  -     ketorolac 30 mg/mL injection; Inject 2 mL (60 mg) intramuscularly One time.    Eye and facial pain with normal exam. There is no facial droop or tenderness to palpation.   She is to take ibuprofen for pain every six hours.   Warm soaks to the side of the face.   If develops worsening pain, swelling, or fever return to the clinic at once.

## 2016-04-21 NOTE — Telephone Encounter (Signed)
Reason for Disposition  . [1] SEVERE eyelid swelling on one side AND [2] red and painful (or tender to touch)    Additional Information  . Negative: Recent injury to the eye  . Negative: Entire face is swollen  . Negative: Yellow or green discharge (pus) in the eye  . Negative: Redness of white area (sclera) of eye(s)  . Negative: [1] SEVERE eyelid swelling (i.e., shut or almost) AND [2] fever  . Negative: [1] Eyelid (outer) is very red AND [2] fever  . Negative: [1] SEVERE eyelid swelling (i.e., shut or almost) AND [2] involves both eyes      (Exception: itchy eyes, which  are probably an allergic reaction)    Protocols used: [facility]
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
## 2016-04-21 NOTE — Telephone Encounter (Signed)
Contact Center call. Pt.says woke up and her R eye is swollen and the whole R side of face is tender.No injury. Says the eyeball itself is fine.Just eyelids very swollen and Tenderness R side of face.

## 2016-04-22 ENCOUNTER — Encounter (HOSPITAL_BASED_OUTPATIENT_CLINIC_OR_DEPARTMENT_OTHER): Payer: No Typology Code available for payment source

## 2016-04-22 ENCOUNTER — Telehealth (HOSPITAL_BASED_OUTPATIENT_CLINIC_OR_DEPARTMENT_OTHER): Payer: Self-pay | Admitting: Gastroenterology

## 2016-04-22 ENCOUNTER — Telehealth (HOSPITAL_BASED_OUTPATIENT_CLINIC_OR_DEPARTMENT_OTHER): Payer: Self-pay

## 2016-04-22 DIAGNOSIS — K921 Melena: Secondary | ICD-10-CM

## 2016-04-22 DIAGNOSIS — K5900 Constipation, unspecified: Secondary | ICD-10-CM

## 2016-04-22 NOTE — Telephone Encounter (Signed)
Patient cancelled her appointment today. Would like for you to give her a call. Thanks.

## 2016-04-22 NOTE — Telephone Encounter (Signed)
Sharepoint Assessment Questions    Estimated body mass index is 45.36 kg/m as calculated from the following:    Height as of 03/23/16: 5' 2.99" (1.6 m).    Weight as of 04/21/16: 256 lb (116.1 kg).    Height 5'2.99  Weight 256  BMI 45.36    GENERAL QUESTIONS:  Does the patient have sufficient understanding of English? Yes  If NO, which language?    Is the patient able to provide consent? Yes (If no, fill in below)    Legal Guardian                                    DPOA:  NAME:                                             NAME:    Phone:                                            Phone:      Patient Assessment    Do you have an allergy to Fentanyl or Versed? No  If yes, what kind of reaction?    Scheduled the patient with Anesthesia?  If yes, routed TE to Charge Nurse Pool to update Allergies in both EHRs?    Have you ever had any problems in the past with sedation (Different from General Anesthesia)? No  If yes, what kind of reaction?  If yes and not already flagged for anesthesia, route TE to Charge Nurse Pool for further evaluation?    Do you use narcotics on a daily basis? No  If yes and not already flagged for anesthesia, route TE to Charge Nurse Pool for further evaluation?    Does you use oxygen at Home?  No  If yes, scheduled the patient with Anesthesia?    Are you taking any Anti-coagulant or Anti-Platelet Medications? No  If yes, which medication are you taking:   Prescribing provider:   If yes, [facility] for further evaluation?    Do you have any bleeding or coagulation disorders? No  If yes, what kind of disorder?    If yes, routed TE to [employer] Nurse Pool for further evaluation?    Are you Diabetic?  No  If yes, what is your prescribing provider's name?   If yes, was patient advised to contact their Prescribing MD for direction?    Are you on dialysis? No  If yes, is it Peritoneal Dialysis? No  If yes to any kind of Dialysis, route TE to Charge Nurse Pool for further  evaluation?     Do you have diagnosed sleep apnea? No  If yes, do you use a sleep apnea machine?  If yes, what kind of machine do you use?   If yes, was the patient instructed to bring their CPAP machine with them?     Do you currently have a diagnosis of Congestive Heart Failure or CHF that you are being treated for? No  If yes and not already flagged for anesthesia, route TE to Charge Nurse Pool for further evaluation?     Do you have a Pacemaker or Defibrillator?  No  If yes, what kind of device do you have?   If yes, name of Cardiologist?   If yes, was the device form faxed to the patient's provider?    If yes, routed TE to Lynden Ang for further evaluation?     Are you a difficult IV start? No  If yes, arranged for early arrival time?     Is the patient a female & younger than 23yrs old? Yes  If yes, are you pregnant or planning to be pregnant by the time of your procedure? No   If yes, advised patient to call us back if they do become pregnant before their procedure date?    If yes, routed TE to [employer] for further evaluation?     Do you have any mobility issues that make it difficult to get onto a stretcher? No  If yes, what kind of mobility issues do you have?     Is this patient scheduled for a colonoscopy? YEs  If yes, do you have less than 2 bowel movements a week? Yes  If yes, do you have limited mobility or are wheelchair bound?   If yes to either, arranged for revised bowel prep?    Is this an Upper Endoscopy SEDATION Case ?   If yes, are you able to open your mouth fully without any difficulty?   If no, arranged for Anesthesia?       PROCEDURE        Procedure MD: TBD  Procedure Type: Sigmoidoscopy  Procedure Date:  TBD     Time:TBD    Procedure Check-In Time: TBD    Patient Teaching  Who received the teaching - Patient? No    Was the topic of stopping iron supplements taught? No    Was the topic of blood thinners taught?  No    Was the topic of diet taught? No    Were  instructions for taking current medications taught? No    Was our transportation policy taught? No    What is the name of their driver? Timothy    How were the procedure preparation instruction materials delivered?  Verbal: No  On Paper: No  eCare message: No  Email: No    Does this procedure require bowel prep? Yes  If yes, were instructions for the ordered laxative taught? Yes  If yes, which RX was prescribed? Fleet's Enema.    General Notes:

## 2016-04-22 NOTE — Telephone Encounter (Signed)
Patient needs to be scheduled with anesthesia. Patients BMI is over 45

## 2016-04-24 ENCOUNTER — Inpatient Hospital Stay: Payer: Self-pay

## 2016-04-24 ENCOUNTER — Encounter (HOSPITAL_BASED_OUTPATIENT_CLINIC_OR_DEPARTMENT_OTHER): Payer: Self-pay | Admitting: Obstetrics/Gynecology

## 2016-04-25 ENCOUNTER — Encounter (INDEPENDENT_AMBULATORY_CARE_PROVIDER_SITE_OTHER): Payer: Self-pay | Admitting: Family Medicine

## 2016-04-25 ENCOUNTER — Ambulatory Visit (INDEPENDENT_AMBULATORY_CARE_PROVIDER_SITE_OTHER): Payer: No Typology Code available for payment source | Admitting: Family Medicine

## 2016-04-25 VITALS — BP 127/76 | HR 94 | Temp 98.6°F | Resp 15 | Ht 63.5 in | Wt 256.0 lb

## 2016-04-25 DIAGNOSIS — F320 Major depressive disorder, single episode, mild: Secondary | ICD-10-CM

## 2016-04-25 DIAGNOSIS — F5101 Primary insomnia: Secondary | ICD-10-CM

## 2016-04-25 DIAGNOSIS — R198 Other specified symptoms and signs involving the digestive system and abdomen: Secondary | ICD-10-CM

## 2016-04-25 DIAGNOSIS — Z6841 Body Mass Index (BMI) 40.0 and over, adult: Secondary | ICD-10-CM

## 2016-04-25 DIAGNOSIS — R102 Pelvic and perineal pain: Secondary | ICD-10-CM

## 2016-04-25 DIAGNOSIS — F4310 Post-traumatic stress disorder, unspecified: Secondary | ICD-10-CM

## 2016-04-25 MED ORDER — NORTRIPTYLINE HCL 25 MG OR CAPS
25.0000 mg | ORAL_CAPSULE | Freq: Every evening | ORAL | 1 refills | Status: DC
Start: 2016-04-25 — End: 2016-04-28

## 2016-04-25 MED ORDER — DULOXETINE HCL 30 MG OR CPEP
30.0000 mg | DELAYED_RELEASE_CAPSULE | Freq: Every day | ORAL | 2 refills | Status: AC
Start: 2016-04-25 — End: ?

## 2016-04-25 NOTE — Patient Instructions (Signed)
It was a pleasure to see you in clinic today.                If you are not yet signed up for eCare, please see the below eCare section at the end of this document for how to enroll and to get your access codes.  It's easy to sign up at home today.    eCare  enrollment will allow you access to the below benefits   You can make appointments online   View test results / Lab Results   Request prescription renewals   Obtain a copy of our After Visit Summary (an electronic copy of this document)     Your Test Results:  If labs were ordered today the results are expected to be available via eCare in about 5 days. If you have an active eCare account, this is how we will notify you of your results.     If you do not have an eCare account then your test results will be mailed to you within about 14 days after your tests are completed. If your physician needs to change your care based on your results or is concerned, you should expect a phone call or eCare message from your provider.    If you have any questions about your test results please schedule an appointment with your provider, so that we may review them with you in greater detail.    **If it has been more than 2 weeks and you have not received your test results please send our office a message via eCare.    Medication Refills: If you need a prescription refilled, please contact your pharmacy 1 week before your current supply will run out to request the refill.  Contacting your pharmacy is the fastest and safest way to obtain a medication refill.  The pharmacy will notify our office.  Please note, that a minimum of 48 to 72 hours is needed to refill a medication,  Please call your pharmacy early to allow enough time to refill before you anticipate running out.  For faster medication refills, you can also schedule an appointment with your provider.    We know you have a choice in where you receive your healthcare and we sincerely thank you for trusting [facility] with your health.
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
## 2016-04-25 NOTE — Progress Notes (Signed)
Reviewed eCare status with Patient:  YES    HEALTH MAINTENANCE:  Has the patient had any of these since their last visit?    Cervical screening/PAP: Not Due     Mammo: Not Due    Colon Screen: Not Due    Diabetic Eye Exam: Not Due      Have you seen a specialist since your last visit: No        HM Due:   Health Maintenance   Topic Date Due   . Pap Smear  10/22/2016   . Chlamydia Screen  10/22/2016   . Gonorrhea Screen  10/22/2016   . Tetanus Vaccine  12/26/2022   . Influenza Vaccine  Completed   . HIV Screen  Completed   . HPV Vaccine  Completed           Future Appointments  Date Time Provider Department Center   04/25/2016 12:30 PM Cecelia ByarsAnderson, Sandra Jean, MD [facility]   04/28/2016 11:00 AM Trudie ReedParman, Natasha L, PT [facility]ESPT [facility]   04/29/2016 11:00 AM Patrecia PourJarka, Lanaiya L, MSW [facility]   04/29/2016 1:15 PM Yates DecampKrishnamurthy, Shoba, MBBS [facility]ES[facility]   05/06/2016 12:45 PM Trudie ReedParman, Natasha L, PT [facility]   05/11/2016 11:15 AM Trudie ReedParman, Natasha L, PT [facility]   05/13/2016 9:00 AM Elon SpannerMiller, Jane Louise, MD [facility]   05/25/2016 2:20 PM Neale BurlyEckert, Linda F, MD [facility]    06/14/2016 6:30 AM Merril AbbeKo, Cynthia Wun-Ping, MD [facility] GI ENDO

## 2016-04-27 ENCOUNTER — Telehealth (INDEPENDENT_AMBULATORY_CARE_PROVIDER_SITE_OTHER): Payer: Self-pay | Admitting: Family Medicine

## 2016-04-27 ENCOUNTER — Encounter (INDEPENDENT_AMBULATORY_CARE_PROVIDER_SITE_OTHER): Payer: Self-pay | Admitting: Family Medicine

## 2016-04-27 DIAGNOSIS — R102 Pelvic and perineal pain: Secondary | ICD-10-CM

## 2016-04-27 NOTE — Progress Notes (Signed)
HCSATS PROGRESS NOTE     VISIT INFORMATION   Location:  [facility]    Duration of session:  60 min    SUBJECTIVE   CC/Reason for referral:  Hx of SA  Relevant interval history:  N/A  Primary clinical model:  CPT    Homework/compliance with plan:  yes  Current symptoms/complaints:  Depression, anxiety, and somatic symptoms   Primary Clinical Targets:  PTS    Standardized Measure Scores:  PTS, depression, and anxiety in clinical range  Current session content:  Trauma Exposure and Processing    Brief description of interventions:  Pt read rewrite of trauma account.  Pt was able to identify new thoughts and feelings.  This therapist completed CPT 5- introduction of challenging questions.     OBJECTIVE   Patient presentation:  appropriate    ASSESSMENT/DIAGNOSIS   Primary diagnostic category: trauma and stressor-related disorders    Secondary diagnostic category: depressive disorders    Specific diagnosis and ICD-10 code:    No diagnosis found.    Update on progress:  Pt remains engaged with therapy.     PLAN   Next session and plan:  8/11  Assigned homework:  CPT 5  Daily transaction log completed:  No  Further recommendations/consultations:  No    NOTES   none

## 2016-04-27 NOTE — Telephone Encounter (Signed)
CONFIRMED PHONE NUMBER: [phone]  CALLERS FIRST AND LAST NAME: Shanda BumpsJessica  FACILITY NAME: na TITLE: na  CALLERS RELATIONSHIP:Self  RETURN CALL: Detailed message on voicemail only     [facility] ONLY) Texting is an option for this clinic. If you would like us to use this option, which mobile phone number should we text to if we are unable to reach you?    SUBJECT: General Message     REASON FOR REQUEST: medication issue    MESSAGE: pt calling regarding Nortriptyline HCl 25 MG Oral Cap rx. States she has been taking this since Monday and is noting urinary retention. Requesting a different rx. Routing to provider for review. Please advise.

## 2016-04-28 ENCOUNTER — Inpatient Hospital Stay: Payer: Self-pay

## 2016-04-28 ENCOUNTER — Ambulatory Visit (INDEPENDENT_AMBULATORY_CARE_PROVIDER_SITE_OTHER): Payer: No Typology Code available for payment source | Admitting: Family Medicine

## 2016-04-28 ENCOUNTER — Ambulatory Visit: Payer: Self-pay

## 2016-04-28 ENCOUNTER — Encounter (INDEPENDENT_AMBULATORY_CARE_PROVIDER_SITE_OTHER): Payer: No Typology Code available for payment source | Admitting: Gastroenterology

## 2016-04-28 ENCOUNTER — Encounter (INDEPENDENT_AMBULATORY_CARE_PROVIDER_SITE_OTHER): Payer: Self-pay | Admitting: Family Medicine

## 2016-04-28 ENCOUNTER — Ambulatory Visit (HOSPITAL_BASED_OUTPATIENT_CLINIC_OR_DEPARTMENT_OTHER): Payer: Self-pay | Admitting: Rehabilitative and Restorative Service Providers"

## 2016-04-28 VITALS — BP 119/84 | HR 89 | Temp 96.9°F | Resp 14 | Wt 252.6 lb

## 2016-04-28 DIAGNOSIS — R200 Anesthesia of skin: Secondary | ICD-10-CM

## 2016-04-28 MED ORDER — GABAPENTIN 100 MG OR CAPS
ORAL_CAPSULE | ORAL | 1 refills | Status: DC
Start: 2016-04-28 — End: 2016-06-06

## 2016-04-28 NOTE — Progress Notes (Signed)
[place] [facility]    Triage      Kristen BridgemanJessica Nicole Salinas is a 25 year old female presenting to Urgent Care.    Chief complaint: left arm numbness and headaches      Brief History:  Started this morning when she woke up with tingling sensation on left arm down. Then 2 hours ago, numbness got worse with and whole arm from the shoulder feels weak. She also started having frontal and left side of head. She said it is dull but constant. No blurring of vision.        Exam:  There were no vitals taken for this visit.  Patient is awake alert oriented, accompanied by a female friend  Patient barely raised her left arm  Denies any sensation on entire left arm    Assessment/Plan:    1. Left arm numbness  - explained to patient ddx being considered which includes but not limited to CVA vs impingement syndrome vs cervical radiculopathy. Patient expressed concern about stroke. Discuss to rule this out, CT scan of head is needed but we do not have that capability here. Patient then opted to go ED as advised by triage. She was thinking we might have a CT here.      Transported via [employer]

## 2016-04-28 NOTE — Telephone Encounter (Signed)
See eCare msg and advise, no A&P for OV 04/25/16. Pt also called, see TE encounter.

## 2016-04-28 NOTE — Telephone Encounter (Addendum)
Pt reporting left side of arm down to finger tips, feeling tingling and  numbness     Pt thinks she slept on arm    Onset:  0900    Symptoms: pain right side of arm a week ago,  Pt had headache yesterday.    Pt reporting got a flu vaccination a week ago and was having right side numbness    [place] lives in [place]    Pt went to urgent care in [place], and was diagnosed with URI, recently    It pt worsens will call 9-1-1.    [facility] [place]    Pt is amenable to care plan above and understands to call back if needing medical consultation.

## 2016-04-28 NOTE — Telephone Encounter (Signed)
Reason for Disposition  • Headache  (and neurologic deficit)    Protocols used: NEUROLOGIC DEFICIT-ADULT-AH

## 2016-04-28 NOTE — Patient Instructions (Signed)
Transferred [facility]

## 2016-04-28 NOTE — Telephone Encounter (Signed)
Pt also sent eCare msg. Will work from that encounter.     No A&P for 04/25/16 OV.

## 2016-04-29 ENCOUNTER — Ambulatory Visit (HOSPITAL_BASED_OUTPATIENT_CLINIC_OR_DEPARTMENT_OTHER): Payer: No Typology Code available for payment source

## 2016-04-29 ENCOUNTER — Encounter (HOSPITAL_BASED_OUTPATIENT_CLINIC_OR_DEPARTMENT_OTHER): Payer: No Typology Code available for payment source | Admitting: Obstetrics/Gynecology

## 2016-04-29 ENCOUNTER — Encounter (INDEPENDENT_AMBULATORY_CARE_PROVIDER_SITE_OTHER): Payer: No Typology Code available for payment source | Admitting: Gastroenterology

## 2016-04-29 ENCOUNTER — Encounter (INDEPENDENT_AMBULATORY_CARE_PROVIDER_SITE_OTHER): Payer: Self-pay | Admitting: Family Medicine

## 2016-04-29 ENCOUNTER — Encounter (HOSPITAL_BASED_OUTPATIENT_CLINIC_OR_DEPARTMENT_OTHER): Payer: No Typology Code available for payment source | Admitting: Obstetrics & Gynecology

## 2016-04-29 DIAGNOSIS — F4310 Post-traumatic stress disorder, unspecified: Secondary | ICD-10-CM
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
## 2016-04-30 NOTE — Telephone Encounter (Signed)
Dr.ANDERSON, please see patients eCare message to assist, thank you.
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
## 2016-05-04 ENCOUNTER — Encounter (HOSPITAL_BASED_OUTPATIENT_CLINIC_OR_DEPARTMENT_OTHER): Payer: Self-pay | Admitting: Obstetrics/Gynecology

## 2016-05-05 ENCOUNTER — Encounter (INDEPENDENT_AMBULATORY_CARE_PROVIDER_SITE_OTHER): Payer: Self-pay | Admitting: Physician Assistant

## 2016-05-05 ENCOUNTER — Ambulatory Visit (INDEPENDENT_AMBULATORY_CARE_PROVIDER_SITE_OTHER): Payer: No Typology Code available for payment source | Admitting: Physician Assistant

## 2016-05-05 VITALS — BP 143/84 | Temp 98.4°F | Ht 63.5 in | Wt 255.0 lb

## 2016-05-05 DIAGNOSIS — Z3009 Encounter for other general counseling and advice on contraception: Secondary | ICD-10-CM

## 2016-05-05 DIAGNOSIS — M549 Dorsalgia, unspecified: Secondary | ICD-10-CM

## 2016-05-05 DIAGNOSIS — G8929 Other chronic pain: Secondary | ICD-10-CM

## 2016-05-05 DIAGNOSIS — Z6841 Body Mass Index (BMI) 40.0 and over, adult: Secondary | ICD-10-CM

## 2016-05-05 LAB — BASIC METABOLIC PANEL
Anion Gap: 11 (ref 4–12)
Calcium: 9.9 mg/dL (ref 8.9–10.2)
Carbon Dioxide, Total: 25 meq/L (ref 22–32)
Chloride: 105 meq/L (ref 98–108)
Creatinine: 0.76 mg/dL (ref 0.38–1.02)
GFR, Calc, African American: >60 mL/min/{1.73_m2} (ref >59)
GFR, Calc, European American: >60 mL/min/{1.73_m2} (ref >59)
Glucose: 96 mg/dL (ref 62–125)
Potassium: 4.2 meq/L (ref 3.6–5.2)
Sodium: 141 meq/L (ref 135–145)
Urea Nitrogen: 12 mg/dL (ref 8–21)

## 2016-05-05 NOTE — Patient Instructions (Signed)
It was a pleasure to see you in clinic today.                If you are not yet signed up for eCare, please see the below eCare section at the end of this document for how to enroll and to get your access codes.  It's easy to sign up at home today.    eCare  enrollment will allow you access to the below benefits   You can make appointments online   View test results / Lab Results   Request prescription renewals   Obtain a copy of our After Visit Summary (an electronic copy of this document)     Your Test Results:  If labs were ordered today the results are expected to be available via eCare in about 5 days. If you have an active eCare account, this is how we will notify you of your results.     If you do not have an eCare account then your test results will be mailed to you within about 14 days after your tests are completed. If your physician needs to change your care based on your results or is concerned, you should expect a phone call or eCare message from your provider.    If you have any questions about your test results please schedule an appointment with your provider, so that we may review them with you in greater detail.    **If it has been more than 2 weeks and you have not received your test results please send our office a message via eCare.    Medication Refills: If you need a prescription refilled, please contact your pharmacy 1 week before your current supply will run out to request the refill.  Contacting your pharmacy is the fastest and safest way to obtain a medication refill.  The pharmacy will notify our office.  Please note, that a minimum of 48 to 72 hours is needed to refill a medication,  Please call your pharmacy early to allow enough time to refill before you anticipate running out.  For faster medication refills, you can also schedule an appointment with your provider.    We know you have a choice in where you receive your healthcare and we sincerely thank you for trusting [facility] with your health.

## 2016-05-05 NOTE — Progress Notes (Signed)
CC: kidney concerns, pregnancy concerns    SUBJECTIVE:    Patient presents with concerns about her kidneys as well as the possibility of being pregnant    She had her Nexplanon removed on September 26.  She began taking birth control pills 2 weeks prior to this.  She reports that she continues to have irregular bleeding.  With clarification she tells me that this is a small amount of bleeding that  usually fails to appear on a sanitary pad.    She had intercourse last night.  She was concerned about pregnancy  so she took a morning after pill about an hour ago.    She continues to take her birth control pills regularly.  She is worried that they may not be 100% effective due to her weight.  She has not missed any pills.    She also reports concern about her kidney function due to the fact that she takes ibuprofen daily.  With clarification she indicates that she take 600 mg once daily.  She has not had a problem with kidney function in the past.  Perhaps this is more on her mind lately because her husband has chronic kidney disease and he will need a kidney transplant.  She would like to be a donor for him if she is able to.    and finally,  Chessica asks about what steps she would need to go through to get permanent long-term contraception - such as tubal ligation.  She reports that she is certain that she never wants to have children and wonders what steps she may need to go through to convince an OB/GYN to perform permanent sterilization on her.            Current Outpatient Prescriptions   Medication Sig Dispense Refill   . Albuterol Sulfate HFA 108 (90 Base) MCG/ACT Inhalation Aero Soln Inhale 2 puffs by mouth every 4 hours as needed for shortness of breath/wheezing. Also may use 2 puffs 10 min pre exercise 1 Inhaler 5   . Clobetasol Propionate 0.05 % External Ointment Use pea sized amount to vestibule nightly 30 g 2   . DULoxetine HCl 30 MG Oral CAPSULE ENTERIC COATED PARTICLES Take 1 capsule (30 mg) by mouth  daily. Start after 1-2 months of 30 mg 30 capsule 2   . Fluconazole 200 MG Oral Tab Use one pill every three days x 3 pills, then once a week for prevention 12 tablet 3   . Gabapentin 100 MG Oral Cap 1 po qhs, can increase by 1 cap qhs q 2-3 days as tolerated, NTE 4 90 capsule 1   . Hydrocortisone (ANUSOL-HC) 2.5 % Rectal Cream Apply rectally 4 times a day. 1 Tube 1   . HydrOXYzine HCl 25 MG Oral Tab Take 1 tablet (25 mg) by mouth every 4 hours as needed. 120 tablet 2   . Ibuprofen 600 MG Oral Tab For pain/inflammation. Take with food. 100 tablet 2   . Lidocaine-Hydrocortisone Ace 3-0.5 % Rectal Cream Apply 1 mL rectally 3 times a day as needed (for rectal pain) for up to 30 days. 28.3 g 2   . MetFORMIN HCl ER 500 MG Oral TABLET SR 24 HR Take 1 tablet (500 mg) by mouth daily. 60 tablet 2   . Spacer/Aero-Holding Chambers (AEROCHAMBER MV) Misc Use. 1 each 0     No current facility-administered medications for this visit.        Review of patient's allergies indicates:  Allergies  Allergen Reactions   . Prednisone Rash and Hives   . Nortriptyline Other     Urinary retention with oral dose 25 mg       Social History   Substance Use Topics   . Smoking status: Former Smoker     Packs/day: 0.25     Years: 2.00     Types: Cigarettes   . Smokeless tobacco: Former NeurosurgeonUser     Quit date: 04/04/2015   . Alcohol use 0.0 oz/week      Comment: Rare  ~ 1 beer per mo       Patient Active Problem List   Diagnosis   . Depression   . Hiatal hernia   . PTSD (post-traumatic stress disorder)   . Subacute vaginitis   . Primary insomnia   . Abnormal cervical Papanicolaou smear   . Constipation, unspecified   . Spasm of muscle   . Muscle weakness   . Abnormal posture   . Bilateral hip pain   . Pelvic pain in female   . Urinary hesitancy   . Vulvar vestibulitis   . Left carpal tunnel syndrome   . Right carpal tunnel syndrome   . PCOS (polycystic ovarian syndrome)   . Right hand pain   . Abnormal bleeding in menstrual cycle   . Nexplanon in  place-07/28/15 L arm   . Exercise-induced asthma   . Acute pain of left knee   . Chronic pain of left knee   . Vaginal yeast infection       OBJECTIVE:  BP 143/84   Temp 98.4 F (36.9 C) (Temporal)   Ht 5' 3.5" (1.613 m)   Wt (!) 255 lb (115.7 kg)   BMI 44.46 kg/m   General appearance: healthy, alert, no distress  generalized pain to palpation of mid back paraspinous muscles.        Assessment and Plan:  (Z30.09) General counseling and advice for contraceptive management  (primary encounter diagnosis)  Plan: We discussed that well birth control pills are not 100% effective they are very effective for most people if they're taken regularly.  We discussed that she does not need to take them at precisely the same time each day, but she should choose to take them in the morning or evening.  Setting an alarm on her phone would likely be helpful to remind her.    As I noted that she is having some pain with intercourse and due to her concerns about contraception I suggest she may try avoiding vaginal penetration for a while to ease her mind about accidental pregnancy as well as to avoid the painful intercourse.    (M54.9,  G89.29) Chronic bilateral back pain, unspecified back location  Plan: BASIC METABOLIC PANEL

## 2016-05-05 NOTE — Progress Notes (Signed)
Reviewed eCare status with Patient:  YES    HEALTH MAINTENANCE:  Has the patient had any of these since their last visit?    Cervical screening/PAP: Not Due     Mammo: Not Due    Colon Screen: Not Due    Diabetic Eye Exam: Not Due      Have you seen a specialist since your last visit:         HM Due:   Health Maintenance   Topic Date Due   . Pap Smear  10/22/2016   . Chlamydia Screen  10/22/2016   . Gonorrhea Screen  10/22/2016   . Tetanus Vaccine  12/26/2022   . Influenza Vaccine  Completed   . HIV Screen  Completed   . HPV Vaccine  Completed           Future Appointments  Date Time Provider Department Center   05/05/2016 12:15 PM Cletis AthensVierra, Michelle Lynne, [place] [facility]   05/06/2016 11:00 AM Patrecia PourJarka, Danyla L, [facility]   05/06/2016 12:45 PM Trudie Reedarman, Natasha L, PT [facility]   05/11/2016 11:15 AM Trudie ReedParman, Natasha L, PT [facility]   05/13/2016 9:00 AM Elon SpannerMiller, Jane Louise, [facility]   05/20/2016 11:00 AM Patrecia PourJarka, Avyonna L, [facility]W [facility]   05/25/2016 2:20 PM Neale BurlyEckert, Linda F, MD [facility]    06/02/2016 1:30 PM Trudie ReedParman, Natasha L, PT [facility]   06/14/2016 6:30 AM Lonn GeorgiaKo, Donell Beersynthia Wun-Ping, MD [facility]

## 2016-05-06 ENCOUNTER — Other Ambulatory Visit
Admit: 2016-05-06 | Discharge: 2016-05-06 | Disposition: A | Discharge status: Home / Self Care | Payer: No Typology Code available for payment source | Attending: Obstetrics/Gynecology | Admitting: Obstetrics/Gynecology

## 2016-05-06 ENCOUNTER — Ambulatory Visit (HOSPITAL_BASED_OUTPATIENT_CLINIC_OR_DEPARTMENT_OTHER): Payer: No Typology Code available for payment source

## 2016-05-06 ENCOUNTER — Encounter (HOSPITAL_BASED_OUTPATIENT_CLINIC_OR_DEPARTMENT_OTHER): Payer: Self-pay | Admitting: Obstetrics/Gynecology

## 2016-05-06 ENCOUNTER — Ambulatory Visit
Payer: No Typology Code available for payment source | Attending: Obstetrics/Gynecology | Admitting: Rehabilitative and Restorative Service Providers"

## 2016-05-06 ENCOUNTER — Other Ambulatory Visit (INDEPENDENT_AMBULATORY_CARE_PROVIDER_SITE_OTHER): Payer: Self-pay

## 2016-05-06 DIAGNOSIS — R300 Dysuria: Secondary | ICD-10-CM

## 2016-05-06 DIAGNOSIS — M62838 Other muscle spasm: Secondary | ICD-10-CM | POA: Insufficient documentation

## 2016-05-06 DIAGNOSIS — R102 Pelvic and perineal pain: Secondary | ICD-10-CM | POA: Insufficient documentation

## 2016-05-06 DIAGNOSIS — R3911 Hesitancy of micturition: Secondary | ICD-10-CM | POA: Insufficient documentation

## 2016-05-06 LAB — URINALYSIS COMPLETE, URN
Bacteria, URN: NONE SEEN
Bilirubin (Qual), URN: NEGATIVE
Epith Cells_Renal/Trans,URN: NEGATIVE /HPF
Glucose Qual, URN: NEGATIVE mg/dL
Ketones, URN: NEGATIVE mg/dL
Leukocyte Esterase, URN: NEGATIVE
Nitrite, URN: NEGATIVE
Protein (Alb Semiquant), URN: NEGATIVE mg/dL
RBC, URN: NEGATIVE /HPF
Specific Gravity, URN: 1.015 g/mL (ref 1.002–1.027)
WBC, URN: NEGATIVE /HPF
pH, URN: 5.5 (ref 5.0–8.0)

## 2016-05-06 NOTE — Progress Notes (Signed)
Physical Therapy Daily Note     The following identifiers were confirmed with the patient today: name and date of birth  Referring provider: Jerelyn Charles    Referring provider NPI: [id number]     Visit count: 7    Subjective/Pain:   Kristen Salinas reports symptoms have improved a little since last visit. She is now able to have intercourse with pain with deeper penetration only (compared to start of this episode of care, in which she had significant pain for multiple days after intercourse). Her home program is going well, she is doing the hip stretching, internal massage. Her urinary function is improving, now only experiencing staccato flow in the AM or if she is fighting significant urgency from delayed emptying, and bowel movements are fine now. She does endorse some new recent life stressors recently.    Objective:  Skilled services/interventions provided:    Therapeutic exercise:  -modified thomas test for hip flexor stretching, 60"x2 bilateral     Neuromuscular re-education:   -pelvic floor muscle awareness training with visual feedback provided, supine, hooklying, and seated    Pre-treatment objective measures: baseline mildly elevated supine/hooklying  Post-treatment objective measures: baseline normal in sitting    Assessment: Progressing very gradually, with some occasional flare ups. I suspect this is related to other stressors, given subjective reports. I advised her to really continue to focus on identifying pelvic floor muscle tension in various positions, various contexts, and to try and manage tension before it becomes painful. I also encouraged her to empty her bladder every 2 hours, whether or not she feels an urge to, in order to avoid excessive urgency (and thus, pelvic floor muscle tension).   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 The driver in her pelvic pain is  likely mutlifactorial and difficult to determine, she states understanding of this, and I encouraged her to continue and work to optimize all aspects of her pelvic health.  Patient's response to therapy: states understanding.   Change in impairments: improved pelvic floor muscle awareness.    Patient education/instruction: verbal instructions and demonstrations were provided for the above exercises  a handout was provided describing the exercises in written and picture format  the patient was able to return demonstrate the exercises accurately after instructions   -asked to resume external, 1st and 2nd layer pelvic floor muscle massage at home    Functional limitation(s) remaining requiring continued skilled services: difficulty with bladder emptying, pain with intimacy    HOME EXERCISE PROGRAM:    Access Code: [email or web address]   URL: [email or web address]   Date: 02/25/2016  Prepared by: Mateo Flow    Program Notes  Work on decreasing gripping/tension of gluteal muscles  Massage by partner to help decrease tone of glutes and pelvic floor muscles  Childs pose breathing  Piriformis stretch, LTR, modified thomas test  Empty bladder every 2 hours    Exercises  -Supine Diaphragmatic Breathing with Pelvic Floor Lengthening - 3x daily - 7x weekly  -PELVIC FLOOR MUSCLE awareness training (focus on relaxation) when emptying bladder or bowel, and as often as possible in various contexts throughout day    GOALS:    1. Pelvic pain reduced by 75% or better by 05/25/2016. (not met, GROC scale: -7)  2. Independent and proficient with home program components including therapeutic exercise and self care to manage condition by 05/25/2016. (inconsistent)  3. Able to  empty bladder (ease and lack of pain) at [place] by 05/25/2016. (worse than baseline)    Plan: follow up on stretching/downtraining efforts, continue to work to reduce pelvic floor muscle tension as able. If no progress in another 2-3 visits,  discharge with updated poc.     Stevan Born, PT

## 2016-05-06 NOTE — Telephone Encounter (Signed)
Kristen BumpsJessica- if you are on the oral contraceptives, even with metformin, you should be protected.  If you are taking the oral contraceptives as scheduled (including having the placebo week) you should not get pregnant.  Dr. Dolphus JennyEckert

## 2016-05-08 LAB — URINE C/S: Colony Count: 3000

## 2016-05-09 ENCOUNTER — Encounter (INDEPENDENT_AMBULATORY_CARE_PROVIDER_SITE_OTHER): Payer: Self-pay | Admitting: Nurse Practitioner

## 2016-05-09 ENCOUNTER — Ambulatory Visit (INDEPENDENT_AMBULATORY_CARE_PROVIDER_SITE_OTHER): Payer: No Typology Code available for payment source | Admitting: Nurse Practitioner

## 2016-05-09 ENCOUNTER — Ambulatory Visit (INDEPENDENT_AMBULATORY_CARE_PROVIDER_SITE_OTHER): Payer: Self-pay | Admitting: Family Medicine

## 2016-05-09 ENCOUNTER — Encounter (INDEPENDENT_AMBULATORY_CARE_PROVIDER_SITE_OTHER): Payer: Self-pay | Admitting: Family Medicine

## 2016-05-09 VITALS — BP 147/85 | HR 95 | Temp 96.8°F | Resp 20 | Wt 253.0 lb

## 2016-05-09 DIAGNOSIS — J0101 Acute recurrent maxillary sinusitis: Secondary | ICD-10-CM

## 2016-05-09 DIAGNOSIS — Z6841 Body Mass Index (BMI) 40.0 and over, adult: Secondary | ICD-10-CM

## 2016-05-09 MED ORDER — AMOXICILLIN-POT CLAVULANATE 875-125 MG OR TABS
1.0000 | ORAL_TABLET | Freq: Two times a day (BID) | ORAL | 0 refills | Status: AC
Start: 2016-05-09 — End: 2016-05-16

## 2016-05-09 NOTE — Patient Instructions (Addendum)
Patient Education     Sinusitis (Antibiotic Treatment)    The sinuses are air-filled spaces within the bones of the face. They connect to the inside of the nose.Sinusitisis an inflammation of the tissue lining the sinus cavity. Sinus inflammation can occur during a cold. It can also be due to allergies to pollens and other particles in the air. Sinusitis can cause symptoms of sinus congestion and fullness. A sinus infection causes fever, headache and facial pain. There is often green or yellow drainage from the nose or into the back of the throat (post-nasal drip). You have been given antibiotics to treat this condition.  Home care:   Take the full course of antibiotics as instructed. Do not stop taking them, even if you feel better.   Drink plenty of water, hot tea, and other liquids. This may help thin mucus. It also may promote sinus drainage.   Heat may help soothe painful areas of the face. Use a towel soaked in hot water. Or, stand in the shower and direct the hot spray onto your face. Using a vaporizer along with a menthol rub at night may also help.   Anexpectorantcontaining guaifenesin may help thin the mucus and promote drainage from the sinuses.   Over-the-counterdecongestantsmay be used unless a similar medicine was prescribed. Nasal sprays work the fastest. Use one that contains phenylephrine or oxymetazoline. First blow the nose gently. Then use the spray. Do not use these medicines more often than directed on the label or symptoms may get worse. You may also use tablets containing pseudoephedrine. Avoid products that combine ingredients, because side effects may be increased. Read labels. You can also ask the pharmacist for help. (NOTE:Persons with high blood pressure should not use decongestants. They can raise blood pressure.)   Over-the-counterantihistaminesmay help if allergies contributed to your sinusitis.    Do not use nasal rinses or irrigation during an acute sinus  infection, unless told to by your health care provider. Rinsing may spread the infection to other sinuses.   Use acetaminophen or ibuprofen to control pain, unless another pain medicine was prescribed. (If you have chronic liver or kidney disease or ever had a stomach ulcer, talk with your doctor before using these medicines. Aspirin should never be used in anyone under 23 years of age who is ill with a fever. It may cause severe liver damage.)   Don't smoke. This can worsen symptoms.  Follow-up care  Follow up with your healthcare provider or our staff if you are not improving within the next week.  When to seek medical advice  Call your healthcare provider if any of these occur:   Facial pain or headache becoming more severe   Stiff neck   Unusual drowsiness or confusion   Swelling of the forehead or eyelids   Vision problems, including blurred or double vision   Fever of100.50F (38C)or higher, or as directed by your healthcare provider   Seizure   Breathing problems   Symptoms not resolving within 10 days  Date Last Reviewed: 11/11/2013   2000-2017 [employer] [address], [place], [place] [id number]. All rights reserved. This information is not intended as a substitute for professional medical care. Always follow your healthcare professional's instructions.    Continue Flonase nasal spray and sudafed for congestion   Humidification   Normal saline sinus irrigation

## 2016-05-09 NOTE — Progress Notes (Signed)
Patient Referred By: No ref. provider found  Patient's PCP: Cecelia ByarsAnderson, Sandra Jean, MD     Subjective:  Patient is a 25 year old female, here to discuss Sinus Problem (pt co sinus problem x 1 wk.//sjg)    The following portions of the patient's history were reviewed with the patient and updated as appropriate: past medical history, past surgical history and past family history.    Has been using sudafed and flonase which she states is not working.       Sinusitis   The current episode started 1 to 4 weeks ago. The problem has been waxing and waning since onset. There has been no fever. Her pain is at a severity of 7/10. The pain is moderate. Associated symptoms include congestion, headaches, sinus pressure and a sore throat. Pertinent negatives include no chills, coughing, diaphoresis, ear pain, hoarse voice, neck pain, shortness of breath, sneezing or swollen glands. Past treatments include acetaminophen. The treatment provided no relief.       Review of Systems   Constitutional: Positive for fatigue. Negative for chills, diaphoresis and fever.   HENT: Positive for congestion, sinus pressure and sore throat. Negative for ear pain, hoarse voice and sneezing.    Eyes: Negative for pain, discharge, redness and itching.   Respiratory: Negative for cough, shortness of breath and wheezing.    Cardiovascular: Negative for chest pain.   Musculoskeletal: Negative for neck pain.   Skin: Negative for rash.   Neurological: Positive for headaches.         Objective:  BP 147/85   Pulse 95   Temp 96.8 F (36 C) (Temporal)   Resp 20   Wt (!) 253 lb (114.8 kg)   SpO2 99%   PF 350 L/min   BMI 44.11 kg/m    Physical Exam   Constitutional: She is oriented to person, place, and time. She appears well-developed and well-nourished. No distress.   HENT:   Head: Normocephalic and atraumatic.   Right Ear: Ear canal normal. A middle ear effusion is present.   Left Ear: Ear canal normal.  No middle ear effusion.   Nose: Mucosal edema  present. No rhinorrhea. Right sinus exhibits maxillary sinus tenderness. Right sinus exhibits no frontal sinus tenderness. Left sinus exhibits maxillary sinus tenderness. Left sinus exhibits no frontal sinus tenderness.   Mouth/Throat: Uvula is midline and mucous membranes are normal. Posterior oropharyngeal erythema present. No oropharyngeal exudate or posterior oropharyngeal edema.   Eyes: Pupils are equal, round, and reactive to light. Right eye exhibits no discharge. Left eye exhibits no discharge.   Neck: Neck supple.   Cardiovascular: Normal rate, regular rhythm and normal heart sounds.    No murmur heard.  Pulmonary/Chest: Effort normal and breath sounds normal. No respiratory distress.   Lymphadenopathy:     She has no cervical adenopathy.   Neurological: She is alert and oriented to person, place, and time.   Skin: Skin is warm and dry. She is not diaphoretic.   Vitals reviewed.       Assessment and Plan:   Diagnoses and all orders for this visit:    Acute recurrent maxillary sinusitis  -     Amoxicillin-Pot Clavulanate (AUGMENTIN) 875-125 MG Oral Tab; Take 1 tablet by mouth 2 times a day for 7 days. Take until gone.    as patient has been ill for greater then 2 weeks and not improving have opted to treat sinusitis as bacterial vs viral.   Proper use of medications and  side effects discussed   Continue Flonase nasal spray.  Humidification may be of benefit.  Normal saline sinus irrigation may also be beneficial.  If her symptoms are not improved post completion of antibiotics she should follow-up with her primary care physician.  ED, infection and medication precautions discussed. Expect improvement in 2-3 days. FU prn. Pt encouraged to ask questions. And risks and benefits of treatment plan discussed.

## 2016-05-09 NOTE — Telephone Encounter (Signed)
Routing to [facility] Nurse Triage Pool to assist.

## 2016-05-09 NOTE — Telephone Encounter (Signed)
Copied from Ecare date 10/9 at 1:09 PM    I was wondering if it is time to treat for the sinus issues that I still have since I saw you last. My right side of my sinus are still sore to the touch, I'm very congusted, no green mucus, my voice has changed because I am so congested. I would try and get in to see you, but there is no appoiments avaible, I did try the nose spary and suafed but my sinuses cannot clear or open up. I feel it is something that should be treated with antibiotics because I can't get it to go away on my own and had lasted almost 4 weeks.      "  I saw Dr. Dareen PianoAnderson back in September" 'I have been using Flonase and Sudafed" "As soon as it wears off the congestion comes back" "the right side of my face is slightly swollen and it's tender when I touch it" "This has been going on for over 4 weeks" "I do have some intermittent ear pain"      Reason for Disposition  . Sinus congestion (pressure, fullness) present > 10 days    Additional Information  . Negative: Sounds like a life-threatening emergency to the triager  . Negative: Difficulty breathing, and not from stuffy nose (e.g., not relieved by cleaning out the nose)  . Negative: SEVERE headache and has fever  . Negative: Patient sounds very sick or weak to the triager  . Negative: SEVERE sinus pain  . Negative: Severe headache  . Negative: Redness or swelling on the cheek, forehead, or around the eye  . Negative: Fever > 103 F (39.4 C)  . Negative: Fever > 100.5 F (38.1 C) and over 23 years of age  . Negative: Fever > 100.5 F (38.1 C) and has diabetes mellitus or a weak immune system (e.g., HIV positive, cancer chemotherapy, organ transplant, splenectomy, chronic steroids)  . Negative: Fever > 100.5 F (38.1 C) and bedridden (e.g., nursing home patient, stroke, chronic illness, recovering from surgery)  . Negative: Fever present > 3 days (72 hours)  . Negative: Fever returns after gone for over 24 hours and symptoms worse or not improved  .  Negative: Sinus pain (not just congestion) and fever  . Negative: Earache    Protocols used: SINUS PAIN AND CONGESTION-ADULT-[place]

## 2016-05-11 ENCOUNTER — Telehealth (HOSPITAL_BASED_OUTPATIENT_CLINIC_OR_DEPARTMENT_OTHER): Payer: Self-pay | Admitting: Obstetrics/Gynecology

## 2016-05-11 ENCOUNTER — Ambulatory Visit (HOSPITAL_BASED_OUTPATIENT_CLINIC_OR_DEPARTMENT_OTHER): Payer: No Typology Code available for payment source | Admitting: Rehabilitative and Restorative Service Providers"

## 2016-05-11 DIAGNOSIS — R3911 Hesitancy of micturition: Secondary | ICD-10-CM

## 2016-05-11 DIAGNOSIS — R102 Pelvic and perineal pain: Secondary | ICD-10-CM

## 2016-05-11 DIAGNOSIS — M62838 Other muscle spasm: Secondary | ICD-10-CM

## 2016-05-11 NOTE — Telephone Encounter (Signed)
ATC to pt to relay Dr Catha GosselinEckert's reply back to her question from 10/4  re: Metformin and birth control pills:     Shanda BumpsJessica- if you are on the oral contraceptives, even with metformin, you should be protected.  If you are taking the oral contraceptives as scheduled (including having the placebo week) you should not get pregnant.  Dr. Dolphus JennyEckert        LM that I was resending on e-care and that MD had "said it was OK since she was still taking her pills as directed".

## 2016-05-11 NOTE — Progress Notes (Signed)
Physical Therapy Daily Note     The following identifiers were confirmed with the patient today: name and date of birth  Referring provider: Ailene Rud    Referring provider NPI: [id number]     Visit count: 8    Subjective/Pain:   Kristen Salinas reports symptoms continue to improve. Chief complaint today is a feeling of pressure around the rectum. No other specific pelvic complaints. States the pressure is central, at anus, doesn't radiate, occurs with sitting primarily, and isn't changed depending on bowel movement or not. Comes on after about 1-2 minutes of sitting, and sometimes resolves immediately with standing, sometimes it lingers for a while. The stretching changes from last week went well.    Objective:  Skilled services/interventions provided:    Manual therapy:  -levator ani release with long-slow holds with moderate pressure, bilateral, externally and positioned prone  -gluteal soft tissue mobilization and connective tissue manipulation bilaterally, medially > laterally    Self-care/home management:  -patient education to spread the gluteals with sitting and to continue with pelvic floor muscle awareness training and pelvic floor muscle drops in all positions (especially standing)    Therapeutic exercise:  -resisted shoulder extension from waist height blue theraband x10, from top of door height blue theraband x10    Assessment: Progressing very gradually, with some occasional flare ups. I suspect this is related to other stressors, given subjective reports. I advised her to really continue to focus on identifying pelvic floor muscle tension in various positions, various contexts, and to try and manage tension before it becomes painful. I also encouraged her to empty her bladder every 2 hours, whether or not she feels an urge to, in order to avoid excessive urgency (and thus, pelvic floor muscle tension).   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  Patient's response to therapy:  states understanding.   Change in impairments: improved pelvic floor muscle awareness and tissue tension.    Patient education/instruction: verbal instructions and demonstrations were provided for the above exercises  a handout was provided describing the exercises in written and picture format  the patient was able to return demonstrate the exercises accurately after instructions   -asked to resume external, 1st and 2nd layer pelvic floor muscle massage at home    Functional limitation(s) remaining requiring continued skilled services: pelvic pain with sitting    HOME EXERCISE PROGRAM:    Access Code: [email or web address]   URL: [email or web address]   Date: 02/25/2016  Prepared by: Larkin Ina    Program Notes  Work on decreasing gripping/tension of gluteal muscles  Massage by partner to help decrease tone of glutes and pelvic floor muscles  Childs pose breathing  Piriformis stretch, LTR, modified thomas test  Empty bladder every 2 hours  Resisted shoulder extension with blue theraband     Exercises  -Supine Diaphragmatic Breathing with Pelvic Floor Lengthening - 3x daily - 7x weekly  -PELVIC FLOOR MUSCLE awareness training (focus on relaxation) when emptying bladder or bowel, and as often as possible in various contexts throughout day    GOALS:    1. Pelvic pain reduced by 75% or better by 05/25/2016. (progressing)  2. Independent and proficient with home program components including therapeutic exercise and self care to manage condition by 05/25/2016. (inconsistent)  3. Able to empty bladder (ease and lack of pain) at PRIOR LEVEL OF FUNCTION by 05/25/2016. (progressing)    Plan: follow up on stretching/downtraining efforts, continue to work to reduce pelvic floor muscle tension as able.  If no progress in another 1-2 visits, discharge with updated poc.     Kristen Salinas, PT

## 2016-05-11 NOTE — Telephone Encounter (Signed)
(  TEXTING IS AN OPTION FOR [facility] CLINICS ONLY)  Is this a [facility] clinic? No      RETURN CALL: Detailed message on voicemail only      SUBJECT:  General Message     REASON FOR REQUEST: Ecare message from 10/4    MESSAGE: Patient sent an ecare message on 10/4 and it appears a reply was sent 10/06 but the patient did not get the reply. She would like it either sent by ecare, or a callback.

## 2016-05-12 ENCOUNTER — Encounter (INDEPENDENT_AMBULATORY_CARE_PROVIDER_SITE_OTHER): Payer: No Typology Code available for payment source | Admitting: Gastroenterology

## 2016-05-13 ENCOUNTER — Ambulatory Visit: Payer: No Typology Code available for payment source | Attending: Urology | Admitting: Urology

## 2016-05-13 ENCOUNTER — Telehealth (HOSPITAL_BASED_OUTPATIENT_CLINIC_OR_DEPARTMENT_OTHER): Payer: Self-pay | Admitting: Urology

## 2016-05-13 ENCOUNTER — Encounter (HOSPITAL_BASED_OUTPATIENT_CLINIC_OR_DEPARTMENT_OTHER): Payer: Self-pay | Admitting: Urology

## 2016-05-13 ENCOUNTER — Encounter (HOSPITAL_BASED_OUTPATIENT_CLINIC_OR_DEPARTMENT_OTHER): Payer: No Typology Code available for payment source | Admitting: Urology

## 2016-05-13 VITALS — BP 133/90 | HR 102 | Ht 63.5 in | Wt 255.0 lb

## 2016-05-13 DIAGNOSIS — Z6841 Body Mass Index (BMI) 40.0 and over, adult: Secondary | ICD-10-CM

## 2016-05-13 DIAGNOSIS — N898 Other specified noninflammatory disorders of vagina: Secondary | ICD-10-CM | POA: Insufficient documentation

## 2016-05-13 DIAGNOSIS — R3911 Hesitancy of micturition: Secondary | ICD-10-CM | POA: Insufficient documentation

## 2016-05-13 DIAGNOSIS — M62838 Other muscle spasm: Secondary | ICD-10-CM | POA: Insufficient documentation

## 2016-05-13 MED ORDER — TAMSULOSIN HCL 0.4 MG OR CAPS
0.4000 mg | ORAL_CAPSULE | Freq: Every day | ORAL | 3 refills | Status: AC
Start: 2016-05-13 — End: ?

## 2016-05-13 MED ORDER — DIAZEPAM 10 MG OR TABS
ORAL_TABLET | ORAL | 0 refills | Status: DC
Start: 2016-05-13 — End: 2016-06-17
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
## 2016-05-13 NOTE — Telephone Encounter (Signed)
Patient called would like results of vaginal swab performed today.

## 2016-05-16 NOTE — Telephone Encounter (Signed)
TC to patient.  Writer informed patient that the result of her vaginal swab was negative.  Patient voiced understanding and thanked RN for the call.    Closing TE.

## 2016-05-18 ENCOUNTER — Ambulatory Visit (INDEPENDENT_AMBULATORY_CARE_PROVIDER_SITE_OTHER): Payer: No Typology Code available for payment source | Admitting: Family Medicine

## 2016-05-18 ENCOUNTER — Encounter (INDEPENDENT_AMBULATORY_CARE_PROVIDER_SITE_OTHER): Payer: Self-pay | Admitting: Family Medicine

## 2016-05-18 VITALS — BP 126/82 | HR 90 | Temp 97.8°F | Resp 14 | Wt 255.0 lb

## 2016-05-18 DIAGNOSIS — J0101 Acute recurrent maxillary sinusitis: Secondary | ICD-10-CM

## 2016-05-18 DIAGNOSIS — Z6841 Body Mass Index (BMI) 40.0 and over, adult: Secondary | ICD-10-CM

## 2016-05-18 MED ORDER — AMOXICILLIN-POT CLAVULANATE 875-125 MG OR TABS
1.0000 | ORAL_TABLET | Freq: Two times a day (BID) | ORAL | 0 refills | Status: AC
Start: 2016-05-18 — End: 2016-05-24

## 2016-05-18 MED ORDER — IPRATROPIUM BROMIDE 0.06 % NA SOLN
2.0000 | Freq: Three times a day (TID) | NASAL | 0 refills | Status: AC
Start: 2016-05-18 — End: 2016-05-25

## 2016-05-18 NOTE — Progress Notes (Signed)
Patient Referred By: No ref. provider found  Patient's PCP: Cecelia Byars, MD  [place] [facility]    Urgent Care Family Medicine Note    Chief Complaint    Mrs. Kristen Salinas is a 25 year old year old female who presents to [facility] for   Chief Complaint   Patient presents with   . Sinus Problem     pt co sinus pressure pain and drainage, sore throat, ear pain both ears a few days ago X 3 weeks//MB        Subjective:  Patient is a 25 year old female, here to discuss Sinus Problem (pt co sinus pressure pain and drainage, sore throat, ear pain both ears a few days ago X 3 weeks//MB)        Pt was feeling better on aug but after finishing sx began to return.      URI    This is a new problem. The current episode started 1 to 4 weeks ago. The problem has been gradually improving. There has been no fever. Associated symptoms include rhinorrhea, sinus pain and a sore throat. Pertinent negatives include no abdominal pain, congestion, ear pain, headaches or wheezing. Treatments tried: aug 7d. The treatment provided significant relief.       Review of Systems   Constitutional: Negative for chills, fatigue and fever.   HENT: Positive for rhinorrhea, sinus pain and sore throat. Negative for congestion and ear pain.    Respiratory: Negative for wheezing.    Gastrointestinal: Negative for abdominal pain.   Allergic/Immunologic: Negative for immunocompromised state.   Neurological: Negative for dizziness and headaches.   Hematological: Does not bruise/bleed easily.   Psychiatric/Behavioral: Negative for agitation and confusion.     I personally reviewed and confirmed the ROS, past medical history, family history and social history in the record with the patient today.        Objective:  Physical Exam   Constitutional: She is oriented to person, place, and time. She appears well-developed and well-nourished. No distress.   HENT:   Head: Normocephalic and atraumatic.      Right Ear: External ear normal.   Left Ear: External ear normal.   Mouth/Throat: Oropharynx is clear and moist. No oropharyngeal exudate.   l canal excess wax.   Eyes: Conjunctivae and EOM are normal. Pupils are equal, round, and reactive to light. Right eye exhibits no discharge. Left eye exhibits no discharge.   Neck: Normal range of motion. Neck supple.   Cardiovascular: Normal rate, regular rhythm and normal heart sounds.    No murmur heard.  Pulmonary/Chest: Effort normal and breath sounds normal. She has no wheezes.   Musculoskeletal: Normal range of motion. She exhibits no edema.   Lymphadenopathy:     She has no cervical adenopathy.   Neurological: She is alert and oriented to person, place, and time. She has normal reflexes. No cranial nerve deficit.   Skin: Skin is warm and dry. Capillary refill takes less than 2 seconds. No rash noted. She is not diaphoretic.   Psychiatric: She has a normal mood and affect. Her behavior is normal. Judgment and thought content normal.   Vitals reviewed.       Assessment and Plan:   Diagnoses and all orders for this visit:    Acute recurrent maxillary sinusitis  -     Amoxicillin-Pot Clavulanate (AUGMENTIN) 875-125 MG Oral Tab; Take 1 tablet by mouth 2 times a day for 6  days. Take until gone.  -     Ipratropium Bromide 0.06 % Nasal Solution; Spray 2 sprays into each nostril 3 times a day for 7 days.    ED, infection and medication precautions discussed. Expect improvement in 2-3 days. FU prn. Pt encouraged to ask questions. And risks and benefits of treatment plan discussed.

## 2016-05-18 NOTE — Patient Instructions (Addendum)
Patient Education     Sinusitis (Antibiotic Treatment)    The sinuses are air-filled spaces within the bones of the face. They connect to the inside of the nose.Sinusitisis an inflammation of the tissue lining the sinus cavity. Sinus inflammation can occur during a cold. It can also be due to allergies to pollens and other particles in the air. Sinusitis can cause symptoms of sinus congestion and fullness. A sinus infection causes fever, headache and facial pain. There is often green or yellow drainage from the nose or into the back of the throat (post-nasal drip). You have been given antibiotics to treat this condition.  Home care:   Take the full course of antibiotics as instructed. Do not stop taking them, even if you feel better.   Drink plenty of water, hot tea, and other liquids. This may help thin mucus. It also may promote sinus drainage.   Heat may help soothe painful areas of the face. Use a towel soaked in hot water. Or, stand in the shower and direct the hot spray onto your face. Using a vaporizer along with a menthol rub at night may also help.   Anexpectorantcontaining guaifenesin may help thin the mucus and promote drainage from the sinuses.   Over-the-counterdecongestantsmay be used unless a similar medicine was prescribed. Nasal sprays work the fastest. Use one that contains phenylephrine or oxymetazoline. First blow the nose gently. Then use the spray. Do not use these medicines more often than directed on the label or symptoms may get worse. You may also use tablets containing pseudoephedrine. Avoid products that combine ingredients, because side effects may be increased. Read labels. You can also ask the pharmacist for help. (NOTE:Persons with high blood pressure should not use decongestants. They can raise blood pressure.)   Over-the-counterantihistaminesmay help if allergies contributed to your sinusitis.    Do not use nasal rinses or irrigation during an acute sinus  infection, unless told to by your health care provider. Rinsing may spread the infection to other sinuses.   Use acetaminophen or ibuprofen to control pain, unless another pain medicine was prescribed. (If you have chronic liver or kidney disease or ever had a stomach ulcer, talk with your doctor before using these medicines. Aspirin should never be used in anyone under 18 years of age who is ill with a fever. It may cause severe liver damage.)   Don't smoke. This can worsen symptoms.  Follow-up care  Follow up with your healthcare provider or our staff if you are not improving within the next week.  When to seek medical advice  Call your healthcare provider if any of these occur:   Facial pain or headache becoming more severe   Stiff neck   Unusual drowsiness or confusion   Swelling of the forehead or eyelids   Vision problems, including blurred or double vision   Fever of100.4F (38C)or higher, or as directed by your healthcare provider   Seizure   Breathing problems   Symptoms not resolving within 10 days  Date Last Reviewed: 11/11/2013   2000-2017 [employer] [address], [place] [address]. All rights reserved. This information is not intended as a substitute for professional medical care. Always follow your healthcare professional's instructions.

## 2016-05-19 ENCOUNTER — Other Ambulatory Visit
Admit: 2016-05-19 | Discharge: 2016-05-19 | Disposition: A | Discharge status: Home / Self Care | Payer: No Typology Code available for payment source | Attending: Obstetrics/Gynecology | Admitting: Obstetrics/Gynecology

## 2016-05-19 ENCOUNTER — Other Ambulatory Visit (INDEPENDENT_AMBULATORY_CARE_PROVIDER_SITE_OTHER): Payer: Self-pay

## 2016-05-19 ENCOUNTER — Encounter (INDEPENDENT_AMBULATORY_CARE_PROVIDER_SITE_OTHER): Payer: Self-pay | Admitting: Obstetrics/Gynecology

## 2016-05-19 DIAGNOSIS — R300 Dysuria: Secondary | ICD-10-CM

## 2016-05-19 DIAGNOSIS — R3911 Hesitancy of micturition: Secondary | ICD-10-CM

## 2016-05-19 LAB — URINALYSIS COMPLETE, URN
Bacteria, URN: NONE SEEN
Bilirubin (Qual), URN: NEGATIVE
Epith Cells_Renal/Trans,URN: NEGATIVE /HPF
Glucose Qual, URN: NEGATIVE mg/dL
Ketones, URN: 80 mg/dL — AB
Leukocyte Esterase, URN: NEGATIVE
Nitrite, URN: NEGATIVE
Protein (Alb Semiquant), URN: NEGATIVE mg/dL
Specific Gravity, URN: 1.025 g/mL (ref 1.002–1.027)
WBC, URN: NEGATIVE /HPF
pH, URN: 5.5 (ref 5.0–8.0)

## 2016-05-20 ENCOUNTER — Encounter (INDEPENDENT_AMBULATORY_CARE_PROVIDER_SITE_OTHER): Payer: No Typology Code available for payment source | Admitting: Family Medicine

## 2016-05-20 ENCOUNTER — Encounter (INDEPENDENT_AMBULATORY_CARE_PROVIDER_SITE_OTHER): Payer: Self-pay | Admitting: Family Medicine

## 2016-05-20 ENCOUNTER — Encounter (HOSPITAL_BASED_OUTPATIENT_CLINIC_OR_DEPARTMENT_OTHER): Payer: No Typology Code available for payment source

## 2016-05-20 LAB — R/O YEAST CULT W/DIRECT EXAM: Stain For Fungus: NONE SEEN

## 2016-05-21 LAB — URINE C/S
Colony Count: <100
Culture: NO GROWTH

## 2016-05-23 ENCOUNTER — Encounter (HOSPITAL_BASED_OUTPATIENT_CLINIC_OR_DEPARTMENT_OTHER): Payer: Self-pay | Admitting: Urology

## 2016-05-25 ENCOUNTER — Encounter (HOSPITAL_BASED_OUTPATIENT_CLINIC_OR_DEPARTMENT_OTHER): Payer: No Typology Code available for payment source | Admitting: Obstetrics/Gynecology

## 2016-05-26 ENCOUNTER — Encounter (INDEPENDENT_AMBULATORY_CARE_PROVIDER_SITE_OTHER): Payer: No Typology Code available for payment source | Admitting: Physician Assistant

## 2016-05-27 ENCOUNTER — Encounter (HOSPITAL_BASED_OUTPATIENT_CLINIC_OR_DEPARTMENT_OTHER): Payer: No Typology Code available for payment source | Admitting: Obstetrics/Gynecology

## 2016-05-27 ENCOUNTER — Ambulatory Visit (HOSPITAL_BASED_OUTPATIENT_CLINIC_OR_DEPARTMENT_OTHER): Payer: No Typology Code available for payment source

## 2016-05-27 DIAGNOSIS — F4310 Post-traumatic stress disorder, unspecified: Secondary | ICD-10-CM

## 2016-05-30 ENCOUNTER — Inpatient Hospital Stay: Payer: Self-pay

## 2016-05-31 ENCOUNTER — Encounter (INDEPENDENT_AMBULATORY_CARE_PROVIDER_SITE_OTHER): Payer: No Typology Code available for payment source | Admitting: Family Medicine

## 2016-06-02 ENCOUNTER — Encounter (HOSPITAL_BASED_OUTPATIENT_CLINIC_OR_DEPARTMENT_OTHER): Payer: No Typology Code available for payment source

## 2016-06-02 ENCOUNTER — Ambulatory Visit (HOSPITAL_BASED_OUTPATIENT_CLINIC_OR_DEPARTMENT_OTHER): Payer: No Typology Code available for payment source | Admitting: Rehabilitative and Restorative Service Providers"

## 2016-06-02 ENCOUNTER — Encounter (INDEPENDENT_AMBULATORY_CARE_PROVIDER_SITE_OTHER): Payer: No Typology Code available for payment source | Admitting: Family Medicine

## 2016-06-02 ENCOUNTER — Telehealth (HOSPITAL_BASED_OUTPATIENT_CLINIC_OR_DEPARTMENT_OTHER): Payer: Self-pay | Admitting: Rehabilitative and Restorative Service Providers"

## 2016-06-02 NOTE — Telephone Encounter (Signed)
Rescheduled pelvic floor therapy appointment to 07/01/2016 at 1:45pm and sent message to provider as FYI.

## 2016-06-02 NOTE — Telephone Encounter (Signed)
(  TEXTING IS AN OPTION FOR [facility] CLINICS ONLY)  Is this a [facility] clinic? No      RETURN CALL: Detailed message on voicemail only      SUBJECT:  Cancellation/Reschedule Request     ORIGINAL APPOINTMENT DATE: 11/2, TIME: 1:30  REASON: Unable to make appointment  REQUESTED DATE: Any, TIME: Any   UNABLE TO APPOINT BECAUSE: Per SOP need to call. CCR attempted to warm transfer to the clinic but care team was assisting other patient.

## 2016-06-06 ENCOUNTER — Encounter (INDEPENDENT_AMBULATORY_CARE_PROVIDER_SITE_OTHER): Payer: Self-pay | Admitting: Family Medicine

## 2016-06-06 ENCOUNTER — Ambulatory Visit (INDEPENDENT_AMBULATORY_CARE_PROVIDER_SITE_OTHER): Payer: No Typology Code available for payment source | Admitting: Family Medicine

## 2016-06-06 VITALS — BP 129/77 | HR 92 | Temp 98.6°F | Resp 14 | Ht 63.5 in | Wt 256.4 lb

## 2016-06-06 DIAGNOSIS — R279 Unspecified lack of coordination: Secondary | ICD-10-CM

## 2016-06-06 DIAGNOSIS — Z6841 Body Mass Index (BMI) 40.0 and over, adult: Secondary | ICD-10-CM

## 2016-06-06 DIAGNOSIS — R102 Pelvic and perineal pain: Secondary | ICD-10-CM

## 2016-06-06 DIAGNOSIS — F40241 Acrophobia: Secondary | ICD-10-CM

## 2016-06-06 DIAGNOSIS — N946 Dysmenorrhea, unspecified: Secondary | ICD-10-CM

## 2016-06-06 DIAGNOSIS — E282 Polycystic ovarian syndrome: Secondary | ICD-10-CM

## 2016-06-06 NOTE — Progress Notes (Signed)
Reviewed eCare status with Patient:  YES    HEALTH MAINTENANCE:  Has the patient had any of these since their last visit?    Cervical screening/PAP: Not Due     Mammo: Not Due    Colon Screen: Not Due    Diabetic Eye Exam: Not Due      Have you seen a specialist since your last visit: No        HM Due:   Health Maintenance   Topic Date Due   . Pap Smear  10/22/2016   . Chlamydia Screen  10/22/2016   . Gonorrhea Screen  10/22/2016   . Tetanus Vaccine  12/26/2022   . Influenza Vaccine  Completed   . HIV Screen  Completed   . HPV Vaccine  Completed           Future Appointments  Date Time Provider Department Center   06/06/2016 12:30 PM Cecelia ByarsAnderson, Sandra Jean, MD [facility]   06/08/2016 1:20 PM Randolph BingWalton, Anna Lee, MD [facility]    06/14/2016 6:30 AM Merril AbbeKo, Cynthia Wun-Ping, [facility]   07/01/2016 1:45 PM Trudie ReedParman, Natasha L, PT [facility]   07/28/2016 2:30 PM Yates DecampKrishnamurthy, Shoba, MBBS [facility]

## 2016-06-06 NOTE — Patient Instructions (Signed)
It was a pleasure to see you in clinic today.                If you are not yet signed up for eCare, please see the below eCare section at the end of this document for how to enroll and to get your access codes.  It's easy to sign up at home today.    eCare  enrollment will allow you access to the below benefits   You can make appointments online   View test results / Lab Results   Request prescription renewals   Obtain a copy of our After Visit Summary (an electronic copy of this document)     Your Test Results:  If labs were ordered today the results are expected to be available via eCare in about 5 days. If you have an active eCare account, this is how we will notify you of your results.     If you do not have an eCare account then your test results will be mailed to you within about 14 days after your tests are completed. If your physician needs to change your care based on your results or is concerned, you should expect a phone call or eCare message from your provider.    If you have any questions about your test results please schedule an appointment with your provider, so that we may review them with you in greater detail.    **If it has been more than 2 weeks and you have not received your test results please send our office a message via eCare.    Medication Refills: If you need a prescription refilled, please contact your pharmacy 1 week before your current supply will run out to request the refill.  Contacting your pharmacy is the fastest and safest way to obtain a medication refill.  The pharmacy will notify our office.  Please note, that a minimum of 48 to 72 hours is needed to refill a medication,  Please call your pharmacy early to allow enough time to refill before you anticipate running out.  For faster medication refills, you can also schedule an appointment with your provider.    We know you have a choice in where you receive your healthcare and we sincerely thank you for trusting [facility] with your health.

## 2016-06-06 NOTE — Progress Notes (Signed)
Kristen Salinas is a 25 year old female.    Chief Complaint   Patient presents with   . Dizziness     dizzy or may be anxious going up and down the stairs.   . Menstrual Problem     Crmaping during periods,still having cramping in lower abdomen.       HPI:  1) Dizziness  [place]  On Ferris wheel - got anxiious and dizzy    Also notices scared of stairs and escalators  Avoids them    Does not feel anxious pre going up steps but notes once starts on stairs after several steps starts to feel like she does not know here her feet are, then gets anxious,   Husband notes has miss stepped when on stairs, going up or down, has been able to catch self, no falls  Usually holds on to railing, ideally both sides if available  Tries to watch steps as she is walking on them      Husband also notes she is not judging distance right    Has seen eye Dr several months ago  Got glasses for mild prescription for reading    No vertigo, near syncope        2) Menstrual cramp    On combo OCP since Sept when Nexplanon removed  Second pack, had severe cramping    OCP and Nexplanon overlapped trying to control bleeding    Took OCP daily but time of day varied    10/5 took plan B in addition to OCP  Since has intercourse during placebo week and condom broke  Thought needed this  Very worried about preventing pregnancy    H/o ovarian cyst    Cramping started 1 week pre menses  Mod flow    10/10 pain, colicky during menses    Severe enough [facility] [place]    Menses ended - 06/01/16, started 05/27/16  Cramping still 3/10    No urinary symptoms or changes in bowel habits    Sees Gyn on Weds  Wanting to discuss IUD then      The above HPI includes the following elements:  context, duration, quality, severity, modifying factors and associated signs and symptoms    ROS:   Constitutional: Positive for fatigue   Eyes: As noted in HPI above   Ears, Nose, Mouth, Throat: Negative    Cardiovascular: Negative    Respiratory: Negative    Gastrointestinal: Negative   Genitourinary: As noted in HPI above   Musculoskeletal: Negative    Skin: Negative    Neurological: As noted in HPI above   Psychiatric: Positive for some anxiety.   Endocrine: Negative    Hematologic/Lymphatic: Negative   Allergic/Immunologic: Negative     Past Medical History:   Diagnosis Date   . Anxiety    . Chest pain    . Depression    . Disorder of menstrual bleeding    . Frequent use of laxatives    . Gastric ulcer    . GERD (gastroesophageal reflux disease)    . Headache    . Heart burn    . Heart murmur    . History of irritable bowel syndrome    . Indigestion    . Nausea and vomiting    . Obesity    . Pap smear abnormality of cervix    . 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 CARPAL TUNNEL RELEASE     . COLONOSCOPY STOMA DX INCLUDING COLLJ SPEC SPX     . ESOPHAGOGASTRODUODENOSCOPY       Review of patient's allergies indicates:  Allergies   Allergen Reactions   . Prednisone Rash and Hives   . Nortriptyline Other     Urinary retention with oral dose 25 mg       Meds:   Outpatient Medications Prior to Visit   Medication Sig Dispense Refill   . Albuterol Sulfate HFA 108 (90 Base) MCG/ACT Inhalation Aero Soln Inhale 2 puffs by mouth every 4 hours as needed for shortness of breath/wheezing. Also may use 2 puffs 10 min pre exercise 1 Inhaler 5   . Clobetasol Propionate 0.05 % External Ointment Use pea sized amount to vestibule nightly 30 g 2   . DiazePAM 10 MG Oral Tab Place one tablet in vagina daily as needed for pelvic floor spasm 30 tablet 0   . DULoxetine HCl 30 MG Oral CAPSULE ENTERIC COATED PARTICLES Take 1 capsule (30 mg) by mouth daily. Start after 1-2 months of 30 mg 30 capsule 2   . Fluconazole 200 MG Oral Tab Use one pill every three days x 3 pills, then once a week for prevention 12 tablet 3   . Gabapentin 100 MG Oral Cap 1 po qhs, can increase by 1 cap qhs q  2-3 days as tolerated, NTE 4 90 capsule 1   . Hydrocortisone (ANUSOL-HC) 2.5 % Rectal Cream Apply rectally 4 times a day. 1 Tube 1   . HydrOXYzine HCl 25 MG Oral Tab Take 1 tablet (25 mg) by mouth every 4 hours as needed. 120 tablet 2   . Ibuprofen 600 MG Oral Tab For pain/inflammation. Take with food. 100 tablet 2   . MetFORMIN HCl ER 500 MG Oral TABLET SR 24 HR Take 1 tablet (500 mg) by mouth daily. 60 tablet 2   . Spacer/Aero-Holding Chambers (AEROCHAMBER MV) Misc Use. 1 each 0   . Tamsulosin HCl 0.4 MG Oral Cap Take 1 capsule (0.4 mg) by mouth daily. 90 capsule 3     No facility-administered medications prior to visit.          Family History     Problem (# of Occurrences) Relation (Name,Age of Onset)    Diabetes (1) Mother          Social History     Social History   . Marital status: Married     Spouse name: N/A   . Number of children: N/A   . Years of education: N/A     Occupational History   . Not on file.     Social History Main Topics   . Smoking status: Former Smoker     Packs/day: 0.25     Years: 2.00     Types: Cigarettes   . Smokeless tobacco: Former Neurosurgeon     Quit date: 04/04/2015   . Alcohol use 0.0 oz/week      Comment: Rare  ~ 1 beer per mo   . Drug use: No      Comment: been 2 years since marijuana use   . Sexual activity: Yes     Partners: Male     Birth control/ protection: Implant, Pill      Comment: didn't tolerate Nexplanon after one year.      Other Topics Concern   . Not on file  Social History Narrative    Lives with boyfriend    5 yo terrier Lily    No kids       P.E:  BP 129/77   Pulse 92   Temp 98.6 F (37 C) (Temporal)   Resp 14   Ht 5' 3.5" (1.613 m)   Wt (!) 256 lb 6.4 oz (116.3 kg)   LMP 05/27/2016 (Exact Date)   SpO2 99%   BMI 44.70 kg/m   General appearance*: Color is normal, Weight appears heavy  for height.  and No acute distress  Affect*: Judgement/insight normal, Mood/affect normal, Orientation oriented to time, person and place and Recent/remote Memory  normal  HEENT: unremarkable, PERRLA, EOMI  Neck: supple nontender, normal thyroid  NEUROLOGICAL:  Cranial Nerves:  CNs II - XII normal.. DTRs:  Normal DTRs.  No pathologic reflexes., Sensation:  Normal to light touch x 4 ext.  RAM, F-N, H-S and Romberg normal.  Normal gait.    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

## 2016-06-08 ENCOUNTER — Encounter (HOSPITAL_BASED_OUTPATIENT_CLINIC_OR_DEPARTMENT_OTHER): Payer: Self-pay | Admitting: Urology

## 2016-06-08 ENCOUNTER — Ambulatory Visit (HOSPITAL_BASED_OUTPATIENT_CLINIC_OR_DEPARTMENT_OTHER)
Payer: No Typology Code available for payment source | Attending: Obstetrics/Gynecology | Admitting: Obstetrics & Gynecology

## 2016-06-08 ENCOUNTER — Encounter (HOSPITAL_BASED_OUTPATIENT_CLINIC_OR_DEPARTMENT_OTHER): Payer: Self-pay | Admitting: Obstetrics & Gynecology

## 2016-06-08 VITALS — BP 140/83 | HR 94 | Temp 98.2°F | Ht 63.5 in | Wt 255.0 lb

## 2016-06-08 DIAGNOSIS — N94810 Vulvar vestibulitis: Secondary | ICD-10-CM | POA: Insufficient documentation

## 2016-06-08 DIAGNOSIS — N898 Other specified noninflammatory disorders of vagina: Secondary | ICD-10-CM

## 2016-06-08 DIAGNOSIS — Z3009 Encounter for other general counseling and advice on contraception: Secondary | ICD-10-CM | POA: Insufficient documentation

## 2016-06-08 DIAGNOSIS — Z6841 Body Mass Index (BMI) 40.0 and over, adult: Secondary | ICD-10-CM

## 2016-06-08 DIAGNOSIS — L298 Other pruritus: Secondary | ICD-10-CM | POA: Insufficient documentation

## 2016-06-08 LAB — PR WET PREP/KOH/TEST, ONSITE
Clue Cells: ABSENT
Odor: NEGATIVE
RBC, Wet Prep: ABSENT
Trich: ABSENT
WBC: ABSENT
Yeast, Wet Prep: ABSENT
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
## 2016-06-08 NOTE — Patient Instructions (Addendum)
[facility] you for visiting [facility].   If you have any questions about your visit please call the triage nurse at [phone].       After Your IUD Is Placed     What can I expect?  . It is normal to have unusual bleeding, spotting, and mild cramping for 3 to 6 months after your IUD is placed. These usually improve over time. If your bleeding or cramping is not getting better, or if you have heavy bleeding or strong pelvic pain, call our office right away.   . Ibuprofen (Advil, Motrin, and others) helps decrease pain and cramping. You can buy ibuprofen at any drugstore without a prescription. Take 3 tablets (200 mg each) by mouth every 6 hours as needed for pain and cramping. Do not take more than 12 tablets in 24 hours. Be sure to take ibuprofen with food.    What problems should I watch for?   . The IUD can sometimes fall out (be expelled from the uterus).   ? If you have heavy bleeding or pain, check with your fingers to make sure you can still feel the IUD strings. Do not pull on the strings, since you could remove the IUD. If you cannot feel the strings, call our office.   ? If you are not having symptoms that concern you, you do not need to check your IUD strings.   . The IUD is a very good form of birth control, but no form of birth control is perfect. If you have nausea, breast tenderness, pelvic pain, or unexpected bleeding, you may be pregnant. If you have these signs, call our office.  . Women who have an IUD can get pelvic infections. Call us right away if you have pain in your pelvis or lower abdomen, unusual vaginal discharge, or a fever higher than 101F (38.3C), if it is not caused by another illness.   . See your healthcare provider for yearly exams, and have all routine screening tests. An IUD does not protect against sexually transmitted infections.     When should the IUD be removed?  An IUD can be removed at any time. IUDs are approved by the Food and Drug Administration (FDA) for up  to a certain number of years, but in some cases the IUD can be used for a longer period of time. It is important that you talk about this with your healthcare provider.   Here are the number of years each IUD can be used:  . Mirena IUD: 5 to 7 years after it is placed (approved by the FDA for 5 years)  . Paragard IUD: 10 to 12 years after it is placed (approved by the FDA for 10 years)  . Skyla IUD: 3 years after it is placed (approved by the FDA for 3 years)  . Liletta IUD: 3 to 7 years after it is placed (approved by the FDA for 3 years)    When should I call my healthcare provider?  Call your healthcare provider if you:  . Have heavy bleeding  . Feel strong or sharp pain in your pelvis or lower abdomen  . Have vaginal discharge that smells bad  . Feel pain when you have sex  . Cannot find the IUD strings  . Have a fever above 101F (38.3C) that you cannot explain  . Think you might be pregnant  . Want to have the IUD removed    Questions?  . Keep these instructions so   you can refer to them as needed.  . Please call your healthcare provider if you have any questions or concerns about your IUD.

## 2016-06-09 ENCOUNTER — Telehealth (HOSPITAL_BASED_OUTPATIENT_CLINIC_OR_DEPARTMENT_OTHER): Payer: Self-pay | Admitting: Obstetrics & Gynecology

## 2016-06-09 ENCOUNTER — Other Ambulatory Visit (INDEPENDENT_AMBULATORY_CARE_PROVIDER_SITE_OTHER): Payer: Self-pay

## 2016-06-09 ENCOUNTER — Ambulatory Visit: Payer: No Typology Code available for payment source | Attending: Family Medicine

## 2016-06-09 DIAGNOSIS — R3911 Hesitancy of micturition: Secondary | ICD-10-CM

## 2016-06-09 DIAGNOSIS — R102 Pelvic and perineal pain: Secondary | ICD-10-CM | POA: Insufficient documentation

## 2016-06-09 DIAGNOSIS — R300 Dysuria: Secondary | ICD-10-CM

## 2016-06-09 LAB — URINALYSIS COMPLETE, URN
Bacteria, URN: NONE SEEN
Bilirubin (Qual), URN: NEGATIVE
Epith Cells_Renal/Trans,URN: NEGATIVE /HPF
Epith Cells_Squamous, URN: NEGATIVE /LPF
Glucose Qual, URN: NEGATIVE mg/dL
Ketones, URN: 15 mg/dL — AB
Leukocyte Esterase, URN: NEGATIVE
Nitrite, URN: NEGATIVE
Protein (Alb Semiquant), URN: NEGATIVE mg/dL
RBC, URN: NEGATIVE /HPF
Specific Gravity, URN: 1.015 g/mL (ref 1.002–1.027)
WBC, URN: NEGATIVE /HPF
pH, URN: 6 (ref 5.0–8.0)

## 2016-06-09 LAB — GC&CHLAM NUCLEIC ACID DETECTN
Chlam Trachomatis Nucleic Acid: NEGATIVE
N.Gonorrhoeae(GC) Nucleic Acid: NEGATIVE

## 2016-06-09 MED ORDER — PEG 3350-KCL-NABCB-NACL-NASULF 236 G OR SOLR
ORAL | 0 refills | Status: AC
Start: 2016-06-09 — End: ?

## 2016-06-09 NOTE — Telephone Encounter (Signed)
(  TEXTING IS AN OPTION FOR [facility] CLINICS ONLY)  Is this a [facility] clinic? No      RETURN CALL: Detailed message on voicemail only      SUBJECT:  General Message     REASON FOR REQUEST: Medical Question     MESSAGE: Pt requested call from clinic regarding orders for pregnancy test before her IUD Placement on 06/22/2016. Please contact and discuss.

## 2016-06-09 NOTE — Progress Notes (Signed)
[facility] PROGRESS NOTE     VISIT INFORMATION   Location:  [facility]    Duration of session:  60 min    SUBJECTIVE   CC/Reason for referral:  Hx of SA  Relevant interval history:  Pt reports that she continues to struggle with somatic complaints including bladder spasms and pelvic pain.    Primary clinical model:  CPT    Homework/compliance with plan:  yes  Current symptoms/complaints:  Somatic complaints, depression  Primary Clinical Targets:  PTS    Standardized Measure Scores:  Depression in normal range.  PTS and anxiety in clinical range  Current session content:  Cognitive Restructuring and Trauma Exposure and Processing    Brief description of interventions:  Reviewed CPT 7 HW.  Completed CPT 8- introduced trust module.     OBJECTIVE   Patient presentation:  appropriate    ASSESSMENT/DIAGNOSIS   Primary diagnostic category: trauma and stressor-related disorders    Secondary diagnostic category: n/a    Specific diagnosis and ICD-10 code:    (F43.10) PTSD (post-traumatic stress disorder)  (primary encounter diagnosis)      Update on progress:  Pt reports that she has continued to work with her doctors to address medical concerns.     PLAN   Next session and plan:  9/22  Assigned homework:  CPT/ coping   Daily transaction log completed:  No  Further recommendations/consultations:  No    NOTES   none

## 2016-06-11 LAB — URINE C/S: Colony Count: 400

## 2016-06-12 NOTE — Progress Notes (Signed)
I saw and evaluated the patient and agree with Dr.Walton's note. Lengthy conversation today about choice of IUD. In light of her pelvic pain history, I recommended a LNG-containing IUD, but also brought up the option of placing the Copper IUD and then achieving ovarian suppression with a hormonal method if needed. 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 We discussed that we are dedicated to finding a contraceptive method that the patient is satisfied with, but that there is no evidence that progestins worsen vulvodynia/vestibular pain and may indeed improve cyclic pelvic pain or pain related to endometriosis/other gyn etiologies. The patient was interested in being "put to sleep" for her IUD placement after prior painful experience in clinic. Recommended against a trip to the OR in favor of a larger dose of lorazepam than she had last time and 5 minutes of lidocaine gel/cream on the perineum prior to speculum placement.    Domenic PoliteElizabeth K Nealy Hickmon, MD

## 2016-06-13 ENCOUNTER — Encounter (HOSPITAL_BASED_OUTPATIENT_CLINIC_OR_DEPARTMENT_OTHER): Payer: Self-pay | Admitting: Obstetrics & Gynecology

## 2016-06-13 NOTE — Progress Notes (Signed)
[facility] PROGRESS NOTE     VISIT INFORMATION   Location:  [facility]    Duration of session:  60 min    SUBJECTIVE   CC/Reason for referral:  Hx of SA  Relevant interval history:  Pt continues to work with her medical providers to address her ongoing pain and bladder spasms/ pelvic pain.  Pt reports that her physical symptoms are impacting her daily.   Primary clinical model:  CPT    Homework/compliance with plan:  yes  Current symptoms/complaints:  Somatic complaints, depression  Primary Clinical Targets:  PTS and Depression    Standardized Measure Scores:  PTS, depression, anxiety clinical   Current session content:  Coping Skills    Brief description of interventions:  This therapist met with pt and discussed coping tools to use to continue to focus on depression management. This therapist discussed some coping tools having the potential to address ongoing pain.    OBJECTIVE   Patient presentation:  appropriate    ASSESSMENT/DIAGNOSIS   Primary diagnostic category: trauma and stressor-related disorders    Secondary diagnostic category: depressive disorders    Specific diagnosis and ICD-10 code:    (F43.10) PTSD (post-traumatic stress disorder)  (primary encounter diagnosis)      Update on progress:  Pt was engaged in session.  Pt reports that she will continue to utilize coping tools daily.     PLAN   Next session and plan:  10/6  Assigned homework:  Continue focus on coping tools/ cpt   Daily transaction log completed:  No  Further recommendations/consultations:  No    NOTES   none

## 2016-06-13 NOTE — Telephone Encounter (Signed)
Patient called to follow up on email from last week.

## 2016-06-14 ENCOUNTER — Ambulatory Visit
Admission: RE | Admit: 2016-06-14 | Discharge: 2016-06-14 | Disposition: A | Discharge status: Home / Self Care | Payer: No Typology Code available for payment source | Attending: Gastroenterology | Admitting: Gastroenterology

## 2016-06-14 ENCOUNTER — Ambulatory Visit (HOSPITAL_COMMUNITY): Payer: Self-pay | Admitting: Gastroenterology

## 2016-06-14 ENCOUNTER — Ambulatory Visit (HOSPITAL_BASED_OUTPATIENT_CLINIC_OR_DEPARTMENT_OTHER): Payer: No Typology Code available for payment source | Admitting: Gastroenterology

## 2016-06-14 DIAGNOSIS — K644 Residual hemorrhoidal skin tags: Secondary | ICD-10-CM

## 2016-06-14 DIAGNOSIS — K640 First degree hemorrhoids: Secondary | ICD-10-CM

## 2016-06-14 DIAGNOSIS — K921 Melena: Secondary | ICD-10-CM

## 2016-06-14 DIAGNOSIS — K5900 Constipation, unspecified: Secondary | ICD-10-CM

## 2016-06-15 ENCOUNTER — Ambulatory Visit (HOSPITAL_BASED_OUTPATIENT_CLINIC_OR_DEPARTMENT_OTHER): Payer: No Typology Code available for payment source | Admitting: Obstetrics & Gynecology

## 2016-06-15 ENCOUNTER — Telehealth (HOSPITAL_BASED_OUTPATIENT_CLINIC_OR_DEPARTMENT_OTHER): Payer: Self-pay | Admitting: Urology

## 2016-06-15 NOTE — Telephone Encounter (Signed)
I sent a message to Dr. Dolphus JennyEckert.

## 2016-06-15 NOTE — Telephone Encounter (Signed)
Per staff message Judeth Cornfield([place], [place]) pt to be scheduled with Dr Hyacinth MeekerMiller next avail.

## 2016-06-15 NOTE — Telephone Encounter (Signed)
Pt would like to speak with MA/Stephanie re this msg. pls call pt back.

## 2016-06-15 NOTE — Telephone Encounter (Signed)
Spoke with patient in regards to refill for the Valium. Stated I had touched base with Dr. Craige CottaKirby and she was reaching out to Dr. Dolphus JennyEckert on who would be following her Valium Rx for refills. Stated I would follow-up with Dr. Craige CottaKirby to see what the care plan is. She states that she has about 15 tablets left due to having to use some after a vaginal ultrasound.     Patient stated she wanted to keep her appointment with Dr. Hyacinth MeekerMiller on 09/01/15.    Routing to Dr. Craige CottaKirby for clarification on care plan

## 2016-06-15 NOTE — Telephone Encounter (Signed)
Pt has been scheduled next available, 1/31, however she needs a refill on Valium, RX and will run out by that time. Please contact pt to discuss or advise pss team on refill request.

## 2016-06-16 ENCOUNTER — Encounter (HOSPITAL_BASED_OUTPATIENT_CLINIC_OR_DEPARTMENT_OTHER): Payer: Self-pay | Admitting: Obstetrics/Gynecology

## 2016-06-17 ENCOUNTER — Encounter (HOSPITAL_BASED_OUTPATIENT_CLINIC_OR_DEPARTMENT_OTHER): Payer: Self-pay | Admitting: Obstetrics/Gynecology

## 2016-06-17 ENCOUNTER — Other Ambulatory Visit (HOSPITAL_BASED_OUTPATIENT_CLINIC_OR_DEPARTMENT_OTHER): Payer: Self-pay | Admitting: Obstetrics/Gynecology

## 2016-06-17 ENCOUNTER — Telehealth (INDEPENDENT_AMBULATORY_CARE_PROVIDER_SITE_OTHER): Payer: Self-pay | Admitting: Obstetrics/Gynecology

## 2016-06-17 ENCOUNTER — Encounter (INDEPENDENT_AMBULATORY_CARE_PROVIDER_SITE_OTHER): Payer: Self-pay | Admitting: Family Medicine

## 2016-06-17 DIAGNOSIS — R102 Pelvic and perineal pain: Secondary | ICD-10-CM

## 2016-06-17 DIAGNOSIS — M62838 Other muscle spasm: Secondary | ICD-10-CM

## 2016-06-17 MED ORDER — DIAZEPAM 10 MG OR TABS
ORAL_TABLET | ORAL | 0 refills | Status: DC
Start: 2016-06-17 — End: 2016-06-17

## 2016-06-17 MED ORDER — DIAZEPAM 10 MG OR TABS
ORAL_TABLET | ORAL | 0 refills | Status: DC
Start: 2016-06-17 — End: 2016-06-21

## 2016-06-17 MED ORDER — DIAZEPAM 10 MG OR TABS
10.0000 mg | ORAL_TABLET | Freq: Every day | ORAL | 0 refills | Status: AC | PRN
Start: 2016-06-17 — End: ?

## 2016-06-17 NOTE — Telephone Encounter (Signed)
See [facility] msg and advise, awaiting response for pharmacy location.

## 2016-06-17 NOTE — Telephone Encounter (Signed)
Pt pref to keep 1/31 appt with Dr. Hyacinth MeekerMiller, states [facility] schedule with Dr. Dolphus JennyEckert.

## 2016-06-17 NOTE — Telephone Encounter (Signed)
Patient called today and she stated that she was instructed by her PCP office to call here and make an appointments with Dr Craige CottaKirby to be able to get a refill for Valium. There is no available appointments with Craige CottaKirby any time soon. So she would like to talk to RN/ Provider to find out when she can be see as her current Rx is running out.  Please call her back at   [facility]  one Information:   Home Phone [phone]        Mobile [phone]

## 2016-06-17 NOTE — Telephone Encounter (Signed)
Patient informed and plans to pick up prescription on Monday.  Diazepam prescription left up at the front desk for pickup.

## 2016-06-17 NOTE — Telephone Encounter (Signed)
Pended pharmacy. Please review and advise.

## 2016-06-17 NOTE — Telephone Encounter (Signed)
Pt requesting refill of vaginal valium.  Last filled 05/13/16, 30 tablets.  Routing to Dr. Craige CottaKirby.

## 2016-06-17 NOTE — Telephone Encounter (Signed)
Vaginal valium refilled. She needs to follow up with her doctor regarding continuing this medication.

## 2016-06-20 NOTE — Telephone Encounter (Signed)
Pt states she will pick-up the prescription 11/21 Tuesday at the front desk.

## 2016-06-21 ENCOUNTER — Telehealth (HOSPITAL_BASED_OUTPATIENT_CLINIC_OR_DEPARTMENT_OTHER): Payer: Self-pay | Admitting: Obstetrics/Gynecology

## 2016-06-21 ENCOUNTER — Telehealth (HOSPITAL_BASED_OUTPATIENT_CLINIC_OR_DEPARTMENT_OTHER): Payer: Self-pay | Admitting: Urology

## 2016-06-21 ENCOUNTER — Encounter (HOSPITAL_BASED_OUTPATIENT_CLINIC_OR_DEPARTMENT_OTHER): Payer: Self-pay | Admitting: Obstetrics/Gynecology

## 2016-06-21 DIAGNOSIS — M62838 Other muscle spasm: Secondary | ICD-10-CM

## 2016-06-21 MED ORDER — DIAZEPAM 10 MG OR TABS
ORAL_TABLET | ORAL | 0 refills | Status: AC
Start: 2016-06-21 — End: ?

## 2016-06-21 NOTE — Telephone Encounter (Signed)
Pt came into clinic, and was told that Dr Dolphus JennyEckert would not prescribe valium. Pt would like to discus. Please contact patient to advise/assist.

## 2016-06-21 NOTE — Telephone Encounter (Signed)
Situation:  Patient wants to know who will manage her Valium prescription.  Background:  Patient saw Dr. Craige CottaKirby on 04/18/16 for bladder pain, levator spasm, and pelvic pain.  Assessment:  Patient states that she checked with Dr. Dolphus JennyEckert per Dr. Evlyn CourierKirby's recommendation to see if she would manage the Valium prescription but was told that Dr. Dolphus JennyEckert will not do any pain control management.   Recommendation:  Sending TE to Dr. Craige CottaKirby for her response.

## 2016-06-21 NOTE — Telephone Encounter (Signed)
Pt to clinic today to pick up RX for 10mg  valium tablets to place in vagina for pelvic floor spasm. RX had been written by Dr. Craige CottaKirby and placed on Percell BeltStephanie Caton, MA's desk but couldn't be found today. So I have written another prescription and pt has been instructed to return the other prescription from Dr. Craige CottaKirby. Again stressed that these tablets are to be put in her vagina and not taken orally.

## 2016-06-27 NOTE — Telephone Encounter (Signed)
Patient is scheduled for a followup visit with [facility] on [facility].  Patient did not have any further questions at this time.

## 2016-06-27 NOTE — Telephone Encounter (Signed)
Kristen Salinas, Anna Catherine, MD  You 5 days ago      Yes, I can see her to follow up on her vaginal valium.     Tobi Bastosnna Social worker(Routing comment)

## 2016-06-29 ENCOUNTER — Telehealth (HOSPITAL_BASED_OUTPATIENT_CLINIC_OR_DEPARTMENT_OTHER): Payer: Self-pay | Admitting: Rehabilitative and Restorative Service Providers"

## 2016-06-29 ENCOUNTER — Ambulatory Visit (HOSPITAL_BASED_OUTPATIENT_CLINIC_OR_DEPARTMENT_OTHER)
Payer: No Typology Code available for payment source | Attending: Obstetrics & Gynecology | Admitting: Obstetrics & Gynecology

## 2016-06-29 VITALS — BP 137/81 | HR 83 | Temp 97.3°F | Ht 63.5 in | Wt 251.0 lb

## 2016-06-29 DIAGNOSIS — Z01818 Encounter for other preprocedural examination: Secondary | ICD-10-CM | POA: Insufficient documentation

## 2016-06-29 DIAGNOSIS — R3911 Hesitancy of micturition: Secondary | ICD-10-CM | POA: Insufficient documentation

## 2016-06-29 DIAGNOSIS — Z975 Presence of (intrauterine) contraceptive device: Secondary | ICD-10-CM | POA: Insufficient documentation

## 2016-06-29 DIAGNOSIS — Z30430 Encounter for insertion of intrauterine contraceptive device: Secondary | ICD-10-CM | POA: Insufficient documentation

## 2016-06-29 LAB — PR U/A NONAUTO DIPSTICK ONLY, ONSITE
Glucose, Urine: NEGATIVE mg/dL
Ketones, URN: NEGATIVE mg/dL
Leukocytes: NEGATIVE
Nitrite, URN: NEGATIVE
Occult Blood, URN: NEGATIVE
Urobilinogen, URN: 0.2 E.U./dL (ref 0.2–1.0)
pH, URN (UWNC): 6 (ref 5.0–8.0)

## 2016-06-29 LAB — PR URINE PREGNANCY TEST HCG, ONSITE: Pregnancy (HCG) (UWNC), URN: NEGATIVE

## 2016-06-29 MED ORDER — KETOROLAC TROMETHAMINE 30 MG/ML IJ SOLN
30.0000 mg | Freq: Once | INTRAMUSCULAR | Status: AC
Start: 2016-06-29 — End: 2016-06-29
  Administered 2016-06-29: 30 mg via INTRAMUSCULAR

## 2016-06-29 MED ORDER — LORAZEPAM 1 MG OR TABS
2.0000 mg | ORAL_TABLET | Freq: Once | ORAL | Status: AC
Start: 2016-06-29 — End: 2016-06-29
  Administered 2016-06-29: 2 mg via ORAL

## 2016-06-29 NOTE — Progress Notes (Signed)
PROCEDURE NOTE:  INTRAUTERINE DEVICE INSERTION    Kristen BridgemanJessica Nicole Salinas is a 25 year old female who presents today for IUD insertion.    A visit for counseling and discussion of this procedure took place at a previous visit and additional questions were addressed today. Cervical assay for chlamydia and gonorrhea was done 06/08/16 and result was negative. Pap smear is up to date (NILM 09/2015). A urine pregnancy test was done today and the result was negative. Patient's last menstrual period was 06/22/2016 and she is still bleeding.    Kyleena IUD  Lot number: [id number]  Exp Date: 10/2017    Indication:  Desire for contraception  Allergic to anesthetic, latex, iodine: NO    Review of contraindications:    Known or suspected pregnancy: NO    Distorted uterine cavity (cannot accommodate an IUD): NO    Current breast cancer (Levonorgestrel IUD only): NO     Cervical cancer, awaiting treatment: NO    Endometrial cancer: NO    Current pelvic infection: NO    Unexplained abnormal uterine bleeding: NO    History of Wilson's disease (Copper IUD only): NO   In compliance with federal regulations, the patient was given the informational brochure that accompanies the IUD: YES  The risks and benefits of use of the IUD were discussed with the patient: YES    Risks include, but are not limited to:   1. The remote chance of contraceptive failure   2. Increased risk of ectopic pregnancy, infection, and/or miscarriage if pregnancy occurs   3. Risk of PID if exposed to sexually transmitted diseases   4. Risk of heavier periods and dysmenorrhea (Copper IUD only)    5. Risk of irregular periods and amenorrhea (Levonorgestrel IUD only)    6. Risk of embedment of the IUD into the endometrium and consequent difficulty in removal   7. Risk of uterine perforation, possible damage to intraabdominal organs and/or need for surgical intervention.       Correct patient NAME and ID YES   Correct PROCEDURE YES   Correct EQUIPMENT and SETTINGS YES      Completed CONSENT YES, Date: 06/29/2016     After signing consent, the patient received 2mg  lorazepam PO and 30mg  ketorolac IM. Topical lidocaine gel was applied along the vestibule.     The patient was placed in the dorsal lithotomy position.  Bimanual exam showed the uterus to be in the anteroflexed position.  A speculum was inserted into the vagina.  The cervix was visualized and appeared nulliparous and without lesions. Scant amount of old blood was visualized in the vault. The cervix and proximal vaginal walls were prepped with povidone iodine. 1mL of plain lidocaine was injected on the anterior lip of the cervix and a single tooth tenaculum was applied to that spot. An additional 18mL (9mL respectively) of 1% plain lidocaine was injected at the 4 and 8 o'clock position of the cervicovaginal junction. The [facility] IUD was used to sound the uterus to a depth of 7cm (her uterus measured 7.1cm on recent pelvic ultrasound). The IUD was then inserted to the fundus using the prepackaged inserter. The IUD strings were trimmed to a length of 3 cm from the cervical os. The tenaculum was removed and the sites were noted to be hemostatic.    The patient tolerated the procedure well. Of note, she was ambulating, mentating, and respirating without issue. She was accompanied by her boyfriend who was her mode of transportation and she was  instructed not to drive or operate heavy machinery or be alone for the rest of the evening.  Complications: none  Procedure performed by Resident physician with Attending present    Postprocedure education was performed, including the following points:  1. You can manually check your IUD strings and were taught how to do this, but this is not required.   2. If you are concerned you are pregnant, call your doctor.   3. Seek immediate care for signs of infection, including pelvic pain, vaginal discharge or excessive or intermenstrual bleeding, and fever.   4. If you have heavy cramping and  are concerned that your IUD has been expelled, please call your provider.   5. The [facility] IUD should be removed 5 years after insertion.  6. It is still necessary to return for periodic check-ups, pap smears, etc at a time interval recommended by your provider.    Patient instructed to return for follow-up after next menses (4-6 weeks).

## 2016-06-29 NOTE — Telephone Encounter (Signed)
[name] states that she needs to schedule physical therapy visits once a week; the next available appointment with Larkin InaNatasha Parman is December 14, however, this is not a good time for the patient and subsequent appointment times are not good for her.  She requests a call back from leadership to discuss scheduling options.    Cinthya G  Neurology, Vascular Surgery and Lab, GI, General Surgery  [facility]  [phone]

## 2016-06-29 NOTE — Progress Notes (Signed)
I saw and evaluated the patient. I have reviewed the resident's documentation and agree with it.  I was present for the entire procedure.

## 2016-06-29 NOTE — Patient Instructions (Signed)
After Your IUD Is Placed     What can I expect?  . It is normal to have unusual bleeding, spotting, and mild cramping for 3 to 6 months after your IUD is placed. These usually improve over time. If your bleeding or cramping is not getting better, or if you have heavy bleeding or strong pelvic pain, call our office right away.   . Ibuprofen (Advil, Motrin, and others) helps decrease pain and cramping. You can buy ibuprofen at any drugstore without a prescription. Take 3 tablets (200 mg each) by mouth every 6 hours as needed for pain and cramping. Do not take more than 12 tablets in 24 hours. Be sure to take ibuprofen with food.    What problems should I watch for?   . The IUD can sometimes fall out (be expelled from the uterus).   ? If you have heavy bleeding or pain, check with your fingers to make sure you can still feel the IUD strings. Do not pull on the strings, since you could remove the IUD. If you cannot feel the strings, call our office.   ? If you are not having symptoms that concern you, you do not need to check your IUD strings.   . The IUD is a very good form of birth control, but no form of birth control is perfect. If you have nausea, breast tenderness, pelvic pain, or unexpected bleeding, you may be pregnant. If you have these signs, call our office.  . Women who have an IUD can get pelvic infections. Call us right away if you have pain in your pelvis or lower abdomen, unusual vaginal discharge, or a fever higher than 101F (38.3C), if it is not caused by another illness.   . See your healthcare provider for yearly exams, and have all routine screening tests. An IUD does not protect against sexually transmitted infections.     When should the IUD be removed?  An IUD can be removed at any time. IUDs are approved by the Food and Drug Administration (FDA) for up to a certain number of years, but in some cases the IUD can be used for a longer period of time. It is important that you talk about this  with your healthcare provider.   Here are the number of years each IUD can be used:  . Mirena IUD: 5 to 7 years after it is placed (approved by the FDA for 5 years)  . Paragard IUD: 10 to 12 years after it is placed (approved by the FDA for 10 years)  . Skyla IUD: 3 years after it is placed (approved by the FDA for 3 years)  . Liletta IUD: 3 to 7 years after it is placed (approved by the FDA for 3 years)    When should I call my healthcare provider?  Call your healthcare provider if you:  . Have heavy bleeding  . Feel strong or sharp pain in your pelvis or lower abdomen  . Have vaginal discharge that smells bad  . Feel pain when you have sex  . Cannot find the IUD strings  . Have a fever above 101F (38.3C) that you cannot explain  . Think you might be pregnant  . Want to have the IUD removed    Questions?  . Keep these instructions so you can refer to them as needed.  . Please call your healthcare provider if you have any questions or concerns about your IUD.

## 2016-06-30 ENCOUNTER — Encounter (INDEPENDENT_AMBULATORY_CARE_PROVIDER_SITE_OTHER): Payer: Self-pay | Admitting: Gastroenterology

## 2016-06-30 NOTE — Telephone Encounter (Signed)
I have spoke with Patient and she will be speaking with Marcelle Smilingatasha about her appointment tomorrow 12/01, she wanted to cancel it, but will discuss with Rodney Boozeasha first since her issue is that there is no availability soon enough.   She has scheduled 2 more appointments in December for now.    Forwarding to provider to discuss w/ patient about tomorrow's appt.

## 2016-07-01 ENCOUNTER — Ambulatory Visit (HOSPITAL_BASED_OUTPATIENT_CLINIC_OR_DEPARTMENT_OTHER): Payer: No Typology Code available for payment source | Admitting: Rehabilitative and Restorative Service Providers"

## 2016-07-04 ENCOUNTER — Encounter (HOSPITAL_BASED_OUTPATIENT_CLINIC_OR_DEPARTMENT_OTHER): Payer: Self-pay | Admitting: Obstetrics & Gynecology

## 2016-07-04 ENCOUNTER — Ambulatory Visit: Payer: Self-pay | Admitting: "Women's Health Care

## 2016-07-04 ENCOUNTER — Telehealth (HOSPITAL_BASED_OUTPATIENT_CLINIC_OR_DEPARTMENT_OTHER): Payer: Self-pay | Admitting: Obstetrics & Gynecology

## 2016-07-04 NOTE — Progress Notes (Signed)
HCSATS PROGRESS NOTE     VISIT INFORMATION   Location:  [facility]    Duration of session:  60 min    SUBJECTIVE   CC/Reason for referral:  Hx of CSA  Relevant interval history:  Pt reports that she is still working with physician to address pelvic pain and bladder spasms.  Pt reports that she has seen a specialist, begun PT for pelvic pain, and is considering an IUD.   Primary clinical model:  CPT    Homework/compliance with plan:  n/a  Current symptoms/complaints:  Pt reports continued somatic complaints, depression, and anxiety.   Primary Clinical Targets:  PTS    Standardized Measure Scores:   Depression in normal range.  Anxiety and pts in clinical range.   Current session content:  final session    Brief description of interventions:  This therapist met with pt for a final session to review what pt has completed.  Pt states that she would like to transfer to general therapy.  This therapist and pt discussed pt returning to [facility] and working with previous therapist.  Pt reports that she liked previous therapist and had a good experience while attending counseling at [facility]. This therapist encouraged pt to contact this therapist if she needs any additional referrals for therapy or assistance.     OBJECTIVE   Patient presentation:  appropriate    ASSESSMENT/DIAGNOSIS   Primary diagnostic category: trauma and stressor-related disorders    Secondary diagnostic category: depressive disorders    Specific diagnosis and ICD-10 code:    (F43.10) PTSD (post-traumatic stress disorder)  (primary encounter diagnosis)      Update on progress:  Pt states that she plans to continue her medical care and will be contacting [facility] for an intake as soon as possible.     PLAN   Next session and plan:  N/A  Assigned homework:  N/A  Daily transaction log completed:  No  Further recommendations/consultations:  No    NOTES   none

## 2016-07-04 NOTE — Telephone Encounter (Signed)
Pt calling. Pt had IUD placed last Wed; pt feels pain and pressure mostly while sitting down. Pain 4-5/10 now; hasn't taken any ibuprofen this mroning. Pt also states having foul odor that she noticed this morning.    Reason for Disposition  . [1] MILD abdominal pain (e.g., does not interfere with normal activities) AND [2] pain comes and goes (cramps) AND [3] present > 48 hours    Protocols used: CONTRACEPTION - IUD SYMPTOMS AND QUESTIONS-ADULT-AH    Clinic: Please call pt.    Adv to call back if symptoms worsen, new symptoms develop, or any further concerns.

## 2016-07-04 NOTE — Telephone Encounter (Signed)
(  TEXTING IS AN OPTION FOR [facility] CLINICS ONLY)  Is this a [facility] clinic? No      RETURN CALL: Detailed message on voicemail only      SUBJECT:  General Message     REASON FOR REQUEST: Pt stated she would like to speak to care team due to Problem with IUD     MESSAGE: see above and ase call to discuss, "CCR attempted to warm transfer, but Care Team was assisting other patients.

## 2016-07-04 NOTE — Telephone Encounter (Signed)
Noted issue already addressed by [facility]

## 2016-07-04 NOTE — Telephone Encounter (Signed)
Spoke with patient who had IUD kyleena inserted last week. Now c/o some pelvic pressure off and on and some sharp pains periodically. Cannot feel strings but say's partner can. Bleeding has stopped. Reassured patient some cramping is to be expected. She will call if worsens between now and her follow up 07/27/16. Is using tylenol once a day and will try heating pain if it becomes worse. Agrees to call if things worsen.

## 2016-07-05 ENCOUNTER — Encounter (HOSPITAL_BASED_OUTPATIENT_CLINIC_OR_DEPARTMENT_OTHER): Payer: Self-pay | Admitting: Obstetrics & Gynecology

## 2016-07-06 ENCOUNTER — Ambulatory Visit (HOSPITAL_BASED_OUTPATIENT_CLINIC_OR_DEPARTMENT_OTHER)
Payer: No Typology Code available for payment source | Attending: Obstetrics & Gynecology | Admitting: Obstetrics & Gynecology

## 2016-07-06 VITALS — BP 145/82 | HR 90 | Temp 98.6°F | Ht 63.5 in | Wt 256.0 lb

## 2016-07-06 DIAGNOSIS — N898 Other specified noninflammatory disorders of vagina: Secondary | ICD-10-CM | POA: Insufficient documentation

## 2016-07-06 DIAGNOSIS — Z6841 Body Mass Index (BMI) 40.0 and over, adult: Secondary | ICD-10-CM

## 2016-07-06 DIAGNOSIS — Z30431 Encounter for routine checking of intrauterine contraceptive device: Secondary | ICD-10-CM | POA: Insufficient documentation

## 2016-07-06 LAB — PR WET MOUNTS INCL PREP VAGINAL CERV/SKIN SPECIMENS, ONSITE
Clue Cells: NEGATIVE
Odor: NEGATIVE
PH, Wet Mount: 4 (ref 4.0–7.0)
RBC: NEGATIVE
Trich: NEGATIVE
WBC, KOH Prep (UWNC): NEGATIVE
Yeast, URN: NEGATIVE

## 2016-07-06 LAB — PR ALL POTASSIUM HYDROXIDE PREPARATIONS: Hyphae: NEGATIVE

## 2016-07-06 NOTE — Telephone Encounter (Signed)
RN returned pt's phone call. She confirms that she is in "severe pain". Pt is able to converse easily over the phone and does not sound to be in distress to this RN. She says that her pain is making it difficult for her to "shower or do much of anything". Pt says that the call center made her an appointment for 2pm this afternoon and the pt says that this is a sufficient plan; she states that she does not need any additional assistance at this time.

## 2016-07-06 NOTE — Telephone Encounter (Signed)
Patient is experiencing "severe IUD pain".    CCR attempted to warm transfer caller to clinic. Please call back to further assist. Thanks!
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
## 2016-07-07 NOTE — Progress Notes (Signed)
I saw and evaluated the patient. I have reviewed the resident's documentation and agree with it.

## 2016-07-07 NOTE — Patient Instructions (Addendum)
[facility] you for visiting [facility].   If you have any questions about your visit please call the triage nurse at [phone].     Patient Education     Birth Control: IUD (Intrauterine Device)    The IUD (intrauterine device) is small, flexible, and T-shaped. A trained healthcare provider places it in the uterus. The IUD is one of the most effective birth control methods. It is also reversible. This means it can be removed at any time by a trained healthcare provider. New IUDs are safe and do not have the risks of older types of IUDs.  Pregnancy rates  Talk to your healthcare provider about the effectiveness of this birth control method.  Types of IUDs  IUD insertion is done in the healthcare provider's office. Two types of IUDs are available:   The copper IUD releases a small amount of copper into the uterus. The copper makes it harder for sperm to reach the egg. The device works for at least 10 years.   The progestin IUD releases a hormone called progestin. It causes changes in the uterus to help prevent pregnancy. The device works for 3 to 5 years, depending on which device is chosen. It may be recommended for women who have anemia or heavy and painful periods.  IUDs have thin strings that hang from the opening of the uterus into the vagina. This lets youcheck that the IUD stays in place.  Things to know about IUDs   IUDs can be used by women who have never been pregnant or by women with a history of sexually transmitted infections (STIs) or tubal pregnancy.   It won't move from the uterus to any other part of the body.   There is a slight risk of the device coming out of the vagina (expulsion).   It may not work in women who have an abnormally shaped uterus.   A copper IUD may cause heavier periods and cramping.   Progestin IUD may cause light periods or no periods at all (irregular bleeding or spotting is possible and normal during first 3 to 6 months).   If you get a sexually transmitted  infection with an IUD in place, symptoms may be more severe.  What to report to your healthcare provider  Be sure your healthcare provider knows if you have:   Asexually transmittedinfection (STI)or possible STI   Liver problems   Blood clots (for progestin IUD only)   Breast cancer or a history of breast cancer (progestin IUD only)   Date Last Reviewed: 09/30/2015   2000-2017 [employer] [address], [place] [address]. All rights reserved. This information is not intended as a substitute for professional medical care. Always follow your healthcare professional's instructions.

## 2016-07-13 ENCOUNTER — Encounter (HOSPITAL_BASED_OUTPATIENT_CLINIC_OR_DEPARTMENT_OTHER): Payer: No Typology Code available for payment source | Admitting: Obstetrics/Gynecology

## 2016-07-14 ENCOUNTER — Ambulatory Visit
Payer: No Typology Code available for payment source | Attending: Obstetrics/Gynecology | Admitting: Rehabilitative and Restorative Service Providers"

## 2016-07-14 ENCOUNTER — Encounter (INDEPENDENT_AMBULATORY_CARE_PROVIDER_SITE_OTHER): Payer: Self-pay | Admitting: Obstetrics/Gynecology

## 2016-07-14 ENCOUNTER — Other Ambulatory Visit
Admit: 2016-07-14 | Discharge: 2016-07-14 | Disposition: A | Discharge status: Home / Self Care | Payer: No Typology Code available for payment source | Attending: Obstetrics/Gynecology | Admitting: Obstetrics/Gynecology

## 2016-07-14 ENCOUNTER — Other Ambulatory Visit (INDEPENDENT_AMBULATORY_CARE_PROVIDER_SITE_OTHER): Payer: Self-pay

## 2016-07-14 DIAGNOSIS — R102 Pelvic and perineal pain: Secondary | ICD-10-CM | POA: Insufficient documentation

## 2016-07-14 DIAGNOSIS — R300 Dysuria: Secondary | ICD-10-CM

## 2016-07-14 DIAGNOSIS — R3911 Hesitancy of micturition: Secondary | ICD-10-CM | POA: Insufficient documentation

## 2016-07-14 DIAGNOSIS — M62838 Other muscle spasm: Secondary | ICD-10-CM | POA: Insufficient documentation

## 2016-07-14 LAB — URINALYSIS COMPLETE, URN
Bilirubin (Qual), URN: NEGATIVE
Epith Cells_Renal/Trans,URN: NEGATIVE /HPF
Epith Cells_Squamous, URN: NEGATIVE /LPF
Glucose Qual, URN: NEGATIVE mg/dL
Ketones, URN: 40 mg/dL — AB
Leukocyte Esterase, URN: NEGATIVE
Nitrite, URN: NEGATIVE
Occult Blood, URN: NEGATIVE
Protein (Alb Semiquant), URN: NEGATIVE mg/dL
RBC, URN: NEGATIVE /HPF
Specific Gravity, URN: 1.03 g/mL — ABNORMAL HIGH (ref 1.002–1.027)
WBC, URN: NEGATIVE /HPF

## 2016-07-15 NOTE — Progress Notes (Signed)
Physical Therapy Daily Note     The following identifiers were confirmed with the patient today: name and date of birth  Referring provider: Jerelyn Charles    Referring provider NPI: [id number]     Visit count: 9    Subjective/Pain:   Kristen Salinas returns after about two month away from the clinic. She notes that she had to cancel some appointments and then struggled with finding other openings. She is frustrated by this, and we discussed some scheduling options to help minimize her frustration going forward. Since being in last, she has started vaginal valium which does help her symptoms some. It helped more initially than it is currently, and she plans to follow up with prescribing providers regarding this. About two weeks ago, she had an IUD placed. She had significant pain after this procedure, which is starting to abate. She notes that bladder and bowel function has been good, and she hasn't been sexually active recently. The last time she did have intimacy, she didn't have an increase in pain.     Chief complaint today is a feeling of pressure around the rectum. No other specific pelvic complaints. States the pressure is central, at anus, doesn't radiate, occurs with sitting primarily, and isn't changed depending on bowel movement or not. Comes on after about 30 minutes of sitting, and sometimes resolves immediately with standing, sometimes it lingers for a while.     She notes that she hasn't been doing any home exercises, stating not having time. She also endorses being stressed a lot recently.    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 Symptoms did improve with treatment. Interestingly, she remains highly concerns about her pelvic floor muscles being "too loose" though she then later says that she thinks they are too tight. It is unclear where this worrying is coming from. Her reported stress, lack of time for self care, and other medical co morbidities are likely contributing to her ongoing symptoms.  Patient's response to therapy: states understanding.     Patient education/instruction: verbal instructions and demonstrations were provided for the above exercises  a handout was provided describing the exercises in written and picture format  the patient was able to return demonstrate the exercises accurately after instructions   -asked to begin soft tissue mobilization near the anus instead of vaginally    Functional limitation(s) remaining requiring continued skilled services: rectal pressure with sitting    HOME EXERCISE PROGRAM:  -gluteal and levator ani stretching, focusing perianally  -self massage levator ani and glutes    GOALS:    1. Pelvic pain reduced by 75% or better by 05/25/2016. (no improvement recently regarding rectal pressure; bladder pain seems to be resolved)  2. Independent and proficient with home program components including therapeutic exercise and self care to manage condition by 05/25/2016. (not met)  3. Able to empty bladder (ease and lack of pain) at [facility] by 05/25/2016. (met)    Plan: follow up on effect of soft tissue mobilization, work to normalize hypertonicity of levator ani, external  anal sphincter, glutes, and work on ability to lengthen and relax pelvic floor muscle during defecation.     Stevan Born, PT

## 2016-07-16 LAB — URINE C/S: Colony Count: 200

## 2016-07-21 ENCOUNTER — Ambulatory Visit (HOSPITAL_BASED_OUTPATIENT_CLINIC_OR_DEPARTMENT_OTHER): Payer: No Typology Code available for payment source | Admitting: Rehabilitative and Restorative Service Providers"

## 2016-07-21 DIAGNOSIS — M62838 Other muscle spasm: Secondary | ICD-10-CM

## 2016-07-21 DIAGNOSIS — R102 Pelvic and perineal pain: Secondary | ICD-10-CM

## 2016-07-21 NOTE — Progress Notes (Signed)
Physical Therapy Daily Note     The following identifiers were confirmed with the patient today: name and date of birth  Referring provider: Jerelyn Charles    Referring provider NPI: [id number]     Visit count: 10    Subjective/Pain:   Ricki reports that she felt pretty good after last visit with decreased symptoms. She didn't try the massage yet though. Her boyfriend Octavia Bruckner is accompanying her tonight to learn how to do the massage himself. She does note that her pain around the rectum did worsen today, and she is unsure of exactly why. She thinks it was either because she had a more difficulty bowel movement this AM (forgot to take her magnesium last night) or due to uterine cramping. She does not report any blood with the bowel movement.     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 Suspect that symptoms will improve as she does more of this soft tissue mobilization at home.  Patient's response to therapy: states understanding.     Patient education/instruction: verbal instructions and demonstrations were provided for the above exercises  a handout was provided describing the exercises in written and picture format  the patient was able to return demonstrate the exercises accurately after instructions   -asked to begin soft tissue mobilization near the anus instead of vaginally    Functional limitation(s) remaining requiring continued skilled services: rectal pressure with sitting    HOME EXERCISE PROGRAM:  -gluteal and levator ani stretching, focusing perianally  -self massage levator ani and glutes    GOALS:    1. Pelvic pain reduced by 75% or better by 05/25/2016. (no improvement recently regarding rectal  pressure; bladder pain seems to be resolved)  2. Independent and proficient with home program components including therapeutic exercise and self care to manage condition by 05/25/2016. (not met)  3. Able to empty bladder (ease and lack of pain) at [facility] by 05/25/2016. (met)    Plan: follow up on effect of soft tissue mobilization, work to normalize hypertonicity of levator ani, external anal sphincter, glutes, and work on ability to lengthen and relax pelvic floor muscle during defecation.     Stevan Born, PT

## 2016-07-27 ENCOUNTER — Encounter (HOSPITAL_BASED_OUTPATIENT_CLINIC_OR_DEPARTMENT_OTHER): Payer: Self-pay | Admitting: Obstetrics & Gynecology

## 2016-07-27 ENCOUNTER — Encounter (HOSPITAL_BASED_OUTPATIENT_CLINIC_OR_DEPARTMENT_OTHER): Payer: No Typology Code available for payment source | Admitting: Obstetrics & Gynecology
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
## 2016-07-28 ENCOUNTER — Encounter (INDEPENDENT_AMBULATORY_CARE_PROVIDER_SITE_OTHER): Payer: No Typology Code available for payment source | Admitting: Gastroenterology

## 2016-08-03 ENCOUNTER — Encounter (HOSPITAL_BASED_OUTPATIENT_CLINIC_OR_DEPARTMENT_OTHER): Payer: No Typology Code available for payment source | Admitting: Obstetrics & Gynecology

## 2016-08-04 ENCOUNTER — Ambulatory Visit (HOSPITAL_BASED_OUTPATIENT_CLINIC_OR_DEPARTMENT_OTHER): Payer: No Typology Code available for payment source | Admitting: Rehabilitative and Restorative Service Providers"

## 2016-08-08 ENCOUNTER — Encounter (INDEPENDENT_AMBULATORY_CARE_PROVIDER_SITE_OTHER): Payer: No Typology Code available for payment source | Admitting: Obstetrics/Gynecology

## 2016-08-17 ENCOUNTER — Encounter (HOSPITAL_BASED_OUTPATIENT_CLINIC_OR_DEPARTMENT_OTHER): Payer: Self-pay | Admitting: Obstetrics/Gynecology

## 2016-08-18 ENCOUNTER — Encounter (INDEPENDENT_AMBULATORY_CARE_PROVIDER_SITE_OTHER): Payer: Self-pay | Admitting: Nurse Practitioner

## 2016-08-18 ENCOUNTER — Ambulatory Visit (INDEPENDENT_AMBULATORY_CARE_PROVIDER_SITE_OTHER): Payer: No Typology Code available for payment source | Admitting: Nurse Practitioner

## 2016-08-18 VITALS — BP 135/84 | HR 113 | Temp 98.1°F | Resp 22 | Wt 255.0 lb

## 2016-08-18 DIAGNOSIS — R109 Unspecified abdominal pain: Secondary | ICD-10-CM

## 2016-08-18 DIAGNOSIS — Z6841 Body Mass Index (BMI) 40.0 and over, adult: Secondary | ICD-10-CM

## 2016-08-18 NOTE — Patient Instructions (Addendum)
Patient Education     Bacterial Vaginosis    You have a vaginal infection called bacterial vaginosis (BV). Both good and bad bacteria are present in a healthy vagina. BV occurs when these bacteria get out of balance. The number of bad bacteria increase. And the number of good bacteria decrease.  BV may or may not cause symptoms. If symptoms do occur, they can include:  · Thin, gray, milky-white, or sometimes green discharge  · Unpleasant odor or “fishy” smell  · Itching, burning, or pain in or around the vagina  It is not known what causes BV, but certain factors can make the problem more likely. This can include:  · Douching  · Having sex with a new partner  · Having sex with more than one partner  BV will sometimes go away on its own. But treatment is usually recommended. This is because untreated BV can increase the risk of more serious health problems such as:  · Pelvic inflammatory disease (PID)  · Preterm delivery (giving birth to a baby early if you’re pregnant)  · HIV and certain other sexually transmitted diseases (STDs)  · Infection after surgery on the reproductive organs  Home care  General care  · BV is most often treated with medicines called antibiotics. These may be given as pills or as a vaginal cream. If antibiotics are prescribed, be sure to use them exactly as directed. Also, be sure to complete all of the medicine, even if your symptoms go away.  · Avoid douching or having sex during treatment.  · If you have sex with a female partner, ask your healthcare provider if she should also be treated.  Prevention  · Limit or avoid douching.  · Avoid having sex. If you do have sex, then take steps to lower your risk:  ¨ Use condoms when having sex.  ¨ Limit the number of partners you have sex with.  Follow-up care  Follow up with your healthcare provider, or as advised.  When to seek medical advice  Call your healthcare provider right away if:  · You have a fever of 100.4ºF (38ºC) or higher, or as  directed by your provider.  · Your symptoms worsen, or they don’t go away within a few days of starting treatment.  · You have new pain in the lower belly or pelvic region.  · You have side effects that bother you or a reaction to the pills or cream you’re prescribed.  · You or any partners you have sex with have new symptoms, such as a rash, joint pain, or sores.  Date Last Reviewed: 02/27/2014  © 2000-2017 [employer]. [address], [place] [address]. All rights reserved. This information is not intended as a substitute for professional medical care. Always follow your healthcare professional's instructions.

## 2016-08-18 NOTE — Progress Notes (Signed)
Patient Referred By: No ref. provider found  Patient's PCP: Cecelia Byars, MD     Subjective:  Patient is a 26 year old female, here to discuss Abdominal Pain (Pt has abdominal pn/cramping x 2 wks, was tx for BV and given topical meds, pt feels the infection may have traveled up her IUD strings and has now infected her uterus//[facility])    The following portions of the patient's history were reviewed with the patient and updated as appropriate: past medical history, past surgical history and past family history.    Patient had an IUD placed the end of November at the [facility].  She is currently seeing a primary care physician at [facility] suggested she change her care to the [facility] group.  2 weeks ago she developed a white discharge from her vagina with itching.  She also has noted since the IUD was placed intermittent lower abdominal cramping.  She was seen by GYN at [facility] on Monday.  They did a vaginal exam and noted she had BV and treated with metronidazole gel.  They also made a suggestion afternoon also found that she may need to have her IUD removed as she had an ovarian cyst on the left.  Patient presents today stating that she continues to have lower abdominal cramping.  She denies nausea or vomiting.  She denies painful intercourse.  She called [facility] who suggested she be seen either in the emergency room or in urgent care and be evaluated and treated for PID.  She denies fever or chills.  Her vaginal discharge and itching have improved since she's been on the metronidazole gel.  She does not note a foul odor at this time.        Review of Systems   Constitutional: Negative for chills, fatigue and fever.   HENT: Negative for congestion.    Respiratory: Negative for cough.    Cardiovascular: Negative for chest pain.   Gastrointestinal: Positive for abdominal pain. Negative for constipation, diarrhea, nausea and vomiting.   Genitourinary: Positive for pelvic pain and vaginal  discharge. Negative for dysuria and vaginal bleeding.   Musculoskeletal: Negative.    Skin: Negative for color change and rash.   Neurological: Negative for dizziness.         Objective:  BP 135/84   Pulse (!) 113   Temp 98.1 F (36.7 C) (Temporal)   Resp 22   Wt (!) 255 lb (115.7 kg)   SpO2 100%   BMI 44.46 kg/m    Physical Exam   Constitutional: She appears well-developed and well-nourished. No distress.   HENT:   Head: Normocephalic and atraumatic.   Eyes: EOM are normal. Pupils are equal, round, and reactive to light.   Neck: Neck supple.   Cardiovascular: Normal rate, regular rhythm, normal heart sounds and intact distal pulses.    Pulmonary/Chest: Effort normal and breath sounds normal.   Abdominal: Normal appearance and bowel sounds are normal. She exhibits no mass. There is no hepatosplenomegaly. There is tenderness in the suprapubic area. There is no rigidity, no rebound, no guarding, no CVA tenderness, no tenderness at McBurney's point and negative Murphy's sign.   Genitourinary: There is no tenderness on the right labia. There is no tenderness on the left labia. Cervix exhibits discharge. Cervix exhibits no motion tenderness and no friability. Right adnexum displays no mass, no tenderness and no fullness. Left adnexum displays no mass, no tenderness and no fullness. No bleeding in the vagina. Vaginal discharge (white  in color.  Metronidazole gel is noted.) found.   Genitourinary Comments: IUD string noted from cervix area cultures collected.   Lymphadenopathy:     She has no cervical adenopathy.   Skin: She is not diaphoretic.   Vitals reviewed.       Assessment and Plan:   Diagnoses and all orders for this visit:    Abdominal cramping  -     GC&CHLAM NUCLEIC ACID DETECTN    Other orders  -     ZEBRA LABELS    Will screen for GC and Chlamydia states is the most common cause of PID.  Patient reassured that she does not have symptoms of PID at this time.  She has mild abdominal cramping, negative  cervical motion tenderness and minimal discharge.  She is also afebrile at this time.  Patient is to continue her metronidazole gel, she was reassured.  She needs to decide if she is going to follow-up with [facility] or [facility] discuss other options for birth control.  We did discuss that at times placement of of the IUD can cause some cramping initially as well as through the the months after as it takes a little bit to adjust to the IUD.  Patient is advised if she develops fever, chills, worsening abdominal pain nausea or vomiting she is to go to the ER at once.  Patient was discharged ambulatory verbalizing understanding of these instructions.

## 2016-08-19 ENCOUNTER — Telehealth (INDEPENDENT_AMBULATORY_CARE_PROVIDER_SITE_OTHER): Payer: Self-pay | Admitting: Nurse Practitioner

## 2016-08-19 LAB — GC&CHLAM NUCLEIC ACID DETECTN
Chlam Trachomatis Nucleic Acid: NEGATIVE
N.Gonorrhoeae(GC) Nucleic Acid: NEGATIVE

## 2016-08-19 NOTE — Telephone Encounter (Signed)
-----   Message from KershawMaryellen Zinsley, [place]NP sent at 08/19/2016  4:05 PM PST -----  Screen for STD is negative.   Follow up as discussed at your visit

## 2016-08-19 NOTE — Telephone Encounter (Signed)
LMTCB

## 2016-08-20 NOTE — Telephone Encounter (Signed)
Called pt lvm to call clinic back for lab results.  Kristen Salinas CMA

## 2016-08-20 NOTE — Progress Notes (Signed)
Spoke with pt and informed her of her results and instructions.  Pt understood.

## 2016-08-20 NOTE — Telephone Encounter (Signed)
Spoke with pt and informed her of her results and instructions.  Pt understood.

## 2016-08-25 ENCOUNTER — Ambulatory Visit (HOSPITAL_BASED_OUTPATIENT_CLINIC_OR_DEPARTMENT_OTHER): Payer: No Typology Code available for payment source | Admitting: Rehabilitative and Restorative Service Providers"

## 2016-08-29 ENCOUNTER — Encounter (INDEPENDENT_AMBULATORY_CARE_PROVIDER_SITE_OTHER): Payer: No Typology Code available for payment source | Admitting: Obstetrics/Gynecology

## 2016-08-31 ENCOUNTER — Encounter (HOSPITAL_BASED_OUTPATIENT_CLINIC_OR_DEPARTMENT_OTHER): Payer: No Typology Code available for payment source | Admitting: Urology

## 2016-09-01 ENCOUNTER — Encounter (INDEPENDENT_AMBULATORY_CARE_PROVIDER_SITE_OTHER): Payer: No Typology Code available for payment source | Admitting: Gastroenterology

## 2016-09-02 ENCOUNTER — Encounter (HOSPITAL_BASED_OUTPATIENT_CLINIC_OR_DEPARTMENT_OTHER): Payer: No Typology Code available for payment source | Admitting: Obstetrics/Gynecology

## 2016-09-06 ENCOUNTER — Ambulatory Visit (HOSPITAL_BASED_OUTPATIENT_CLINIC_OR_DEPARTMENT_OTHER): Payer: No Typology Code available for payment source | Admitting: Rehabilitative and Restorative Service Providers"

## 2016-09-19 ENCOUNTER — Inpatient Hospital Stay: Payer: Self-pay

## 2016-09-20 ENCOUNTER — Ambulatory Visit (HOSPITAL_BASED_OUTPATIENT_CLINIC_OR_DEPARTMENT_OTHER): Payer: No Typology Code available for payment source | Admitting: Rehabilitative and Restorative Service Providers"

## 2016-10-04 ENCOUNTER — Ambulatory Visit (HOSPITAL_BASED_OUTPATIENT_CLINIC_OR_DEPARTMENT_OTHER): Payer: Self-pay | Admitting: Rehabilitative and Restorative Service Providers"

## 2016-10-17 ENCOUNTER — Ambulatory Visit (HOSPITAL_BASED_OUTPATIENT_CLINIC_OR_DEPARTMENT_OTHER): Payer: Self-pay | Admitting: Rehabilitative and Restorative Service Providers"

## 2016-11-07 ENCOUNTER — Encounter (INDEPENDENT_AMBULATORY_CARE_PROVIDER_SITE_OTHER): Payer: No Typology Code available for payment source

## 2016-11-28 ENCOUNTER — Encounter (INDEPENDENT_AMBULATORY_CARE_PROVIDER_SITE_OTHER): Payer: No Typology Code available for payment source | Admitting: Hand Surgery

## 2016-12-05 ENCOUNTER — Encounter (INDEPENDENT_AMBULATORY_CARE_PROVIDER_SITE_OTHER): Payer: No Typology Code available for payment source | Admitting: Hand Surgery

## 2017-04-17 ENCOUNTER — Encounter (INDEPENDENT_AMBULATORY_CARE_PROVIDER_SITE_OTHER): Payer: Self-pay | Admitting: Hand Surgery

## 2017-04-17 ENCOUNTER — Ambulatory Visit: Payer: No Typology Code available for payment source | Attending: Nurse Practitioner | Admitting: Hand Surgery

## 2017-04-17 VITALS — BP 131/85 | HR 96 | Temp 98.4°F | Resp 16 | Ht 63.0 in | Wt 250.0 lb

## 2017-04-17 DIAGNOSIS — G5602 Carpal tunnel syndrome, left upper limb: Secondary | ICD-10-CM

## 2017-04-17 DIAGNOSIS — Z6841 Body Mass Index (BMI) 40.0 and over, adult: Secondary | ICD-10-CM

## 2017-04-17 NOTE — Progress Notes (Signed)
Patient is scheduled Left carpal tunnel release                    TBD(date of surgeryease)    Present for teaching:      TOPICS TAUGHT:     Medications to Avoid Before Surgery  Pain Management  Constipation After Your Operation  Preventing Blood Clots  About your Surgery Experience   Information About Your Health Care  Recovering at Home Following Anesthesia.  [place] Location and Floor Map    Patient was instructed not to shave any part of the body for 48 hours prior to surgery.  Instructed not to use make-up, deodorant, lotions, hair products, or fragrances on the day of surgery.  Patient was instructed to use Antibacterial Soap to shower from the neck down both the night before surgery and again the morning of surgery prior to coming to the hospital.  Surgical Soap provided.  Patient was instructed to be NPO after midnight the night before surgery.  Patient was instructed to leave valuables at home.    Patient was instructed to go to nearest ER if experiencing life threatening signs and symptoms.  Patient instructed to have a responsible person as an escort home after discharge from hospital after surgery, also to have a responsible adult present with him for the first 24 hours at home. Patient's escort will beTimothy [phone]     Patient was provided phone numbers to call for questions or concerns and understands reasons to phone.      TEACHING METHOD(S) USED:  ( ) One-on one teaching  ( ) Written materials  RESPONSE TO TEACHING/OUTCOMES:  ( ) Voiced understanding  ( ) Repeated back  ( ) Further instruction/reinforcement needed              Ludger Nutting, RN  [facility]                                Multiple questions asked and answered  Patient agrees and confirms this surgical plan.          0

## 2017-04-17 NOTE — Progress Notes (Signed)
Kristen Salinas is a 26 year old female who is here for follow-up of her carpal tunnel syndrome. Patient was last seen in clinic on 05/25/2015, 2 weeks s/p R carpal tunnel release. Her right hand is doing well. She comes in with worsening symptoms of pain and numbness over the left hand.  She is also starting to drop things and having dexterity problems. She has tried wrist bracing without much relief.    Patient currently rates her pain as 7/10 in her left hand that is Aching, Throbbing and Burning. Sensation is diminished in the left thumb, index finger and middle finger.  She notes a decrease in strength in her left hand.    Past Medical History:   Diagnosis Date   . Anxiety    . Chest pain    . Depression    . Disorder of menstrual bleeding    . Frequent use of laxatives    . Gastric ulcer    . GERD (gastroesophageal reflux disease)    . Headache    . Heart burn    . Heart murmur    . History of irritable bowel syndrome    . Indigestion    . Nausea and vomiting    . Obesity    . Pap smear abnormality of cervix    . 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 Albuterol Sulfate HFA 108 (90 Base) MCG/ACT Inhalation Aero Soln Inhale 2 puffs by mouth every 4 hours as needed for shortness of breath/wheezing. Also may use 2 puffs 10 min pre exercise 1 Inhaler 5   . Clobetasol Propionate 0.05 % External Ointment Use pea sized amount to vestibule nightly 30 g 2   . DiazePAM 10 MG Oral Tab Place one tablet in vagina daily as needed for pelvic floor spasm 30 tablet 0   . DiazePAM 10 MG Oral Tab Take 1 tablet (10 mg) by mouth daily as needed. For pelvic pain 30 tablet 0   . DULoxetine HCl 30 MG Oral CAPSULE ENTERIC COATED PARTICLES Take 1 capsule (30 mg) by mouth daily. Start after 1-2 months of 30 mg 30 capsule 2   . Fluconazole 200 MG Oral Tab Use one pill every three days x 3 pills, then once a week for prevention 12  tablet 3   . Hydrocortisone (ANUSOL-HC) 2.5 % Rectal Cream Apply rectally 4 times a day. 1 Tube 1   . HydrOXYzine HCl 25 MG Oral Tab Take 1 tablet (25 mg) by mouth every 4 hours as needed. 120 tablet 2   . Ibuprofen 600 MG Oral Tab For pain/inflammation. Take with food. 100 tablet 2   . Levonorgestrel (Kyleena) 19.5 mg Intrauterine Device Insert 1 Intra Uterine Device into the uterus One time.     Marland Kitchen PEG 3350-KCl-NaBcb-NaCl-NaSulf (GOLYTELY) 236 g Oral Recon Soln Take per instructions provided for colonoscopy prep. 4000 mL 0   . Spacer/Aero-Holding Chambers (AEROCHAMBER MV) Misc Use. 1 each 0   . Tamsulosin HCl 0.4 MG Oral Cap Take 1 capsule (0.4 mg) by mouth daily. 90 capsule 3     No current facility-administered medications for this visit.          Social History: Does not smoke    ROS:   No new skin, neurologic, or musculoskeletal problems     Hand & Upper Extremity Examination    Physical Examination  Gen:  Patient is healthy, alert, no distress    Psych: Alert and oriented times 3  Pleasant female. Mood and affect appropriate.    Skin: warm, normal color and sweat patterns.    Vascular: Fingers warm, pink, with brisk capillary refill    Neurologic: Sensation to light touch decreased over the L thumb, index finger and middle finger    Musculoskeletal:  No swelling over the left hand  +Tinels and Durkan's over left carpal tunnel.  5/5 APB strength  No thenar atrophy      Studies: NCS from previously demonstrated motor latency 4.1 ms, sensory latency 4.2 ms  c/w L CTS      Assessment:   Moderate L carpal tunnel syndrome with positive NCS/EMG from 2 years ago now with worsening symptoms of numbness and and loss of dexterity.      Plan:  Ms. Haese has failed a trial of conservative management with rest and bracing for her left carpal tunnel syndrome.  She would like to proceed with surgery    The plan will be for L open carpal tunnel release.      The risks and benefits of surgery were explained to the patient  including potential complications that include but are not limited to bleeding, infection, wound problems, persistent numbnes, pain, stiffness, swelling, and possible revision surgery.  Informed consent was obtained. Patient will work with my surgical scheduler to arrange a date for surgery.        Azucena Fallen, MD  Associate Professor  [facility]  Department of Orthopaedics and Sports Medicine  Hand, Wrist, and Elbow Surgery

## 2017-05-08 ENCOUNTER — Encounter (HOSPITAL_BASED_OUTPATIENT_CLINIC_OR_DEPARTMENT_OTHER): Payer: Self-pay | Admitting: Hand Surgery

## 2017-05-18 ENCOUNTER — Telehealth (HOSPITAL_BASED_OUTPATIENT_CLINIC_OR_DEPARTMENT_OTHER): Payer: No Typology Code available for payment source

## 2017-05-19 ENCOUNTER — Ambulatory Visit: Payer: No Typology Code available for payment source

## 2017-05-24 ENCOUNTER — Other Ambulatory Visit (HOSPITAL_BASED_OUTPATIENT_CLINIC_OR_DEPARTMENT_OTHER): Payer: Self-pay | Admitting: Plastic Surgery

## 2017-05-24 DIAGNOSIS — G5602 Carpal tunnel syndrome, left upper limb: Secondary | ICD-10-CM

## 2017-05-25 ENCOUNTER — Ambulatory Visit
Admission: RE | Admit: 2017-05-25 | Discharge: 2017-05-25 | Disposition: A | Discharge status: Home / Self Care | Payer: No Typology Code available for payment source | Attending: Hand Surgery | Admitting: Hand Surgery

## 2017-05-25 ENCOUNTER — Ambulatory Visit (HOSPITAL_BASED_OUTPATIENT_CLINIC_OR_DEPARTMENT_OTHER): Payer: No Typology Code available for payment source | Admitting: Hand Surgery

## 2017-05-25 DIAGNOSIS — K589 Irritable bowel syndrome without diarrhea: Secondary | ICD-10-CM | POA: Insufficient documentation

## 2017-05-25 DIAGNOSIS — J45909 Unspecified asthma, uncomplicated: Secondary | ICD-10-CM | POA: Insufficient documentation

## 2017-05-25 DIAGNOSIS — Z6841 Body Mass Index (BMI) 40.0 and over, adult: Secondary | ICD-10-CM | POA: Insufficient documentation

## 2017-05-25 DIAGNOSIS — F408 Other phobic anxiety disorders: Secondary | ICD-10-CM | POA: Insufficient documentation

## 2017-05-25 DIAGNOSIS — K449 Diaphragmatic hernia without obstruction or gangrene: Secondary | ICD-10-CM | POA: Insufficient documentation

## 2017-05-25 DIAGNOSIS — Z87891 Personal history of nicotine dependence: Secondary | ICD-10-CM | POA: Insufficient documentation

## 2017-05-25 DIAGNOSIS — G5602 Carpal tunnel syndrome, left upper limb: Secondary | ICD-10-CM

## 2017-05-25 DIAGNOSIS — K219 Gastro-esophageal reflux disease without esophagitis: Secondary | ICD-10-CM | POA: Insufficient documentation

## 2017-05-25 DIAGNOSIS — F3289 Other specified depressive episodes: Secondary | ICD-10-CM | POA: Insufficient documentation

## 2017-05-25 DIAGNOSIS — F4310 Post-traumatic stress disorder, unspecified: Secondary | ICD-10-CM | POA: Insufficient documentation

## 2017-05-29 ENCOUNTER — Ambulatory Visit
Payer: No Typology Code available for payment source | Attending: Hand Surgery | Admitting: Rehabilitative and Restorative Service Providers"

## 2017-05-29 DIAGNOSIS — Z4789 Encounter for other orthopedic aftercare: Secondary | ICD-10-CM | POA: Insufficient documentation

## 2017-05-29 DIAGNOSIS — M25632 Stiffness of left wrist, not elsewhere classified: Secondary | ICD-10-CM | POA: Insufficient documentation

## 2017-05-29 DIAGNOSIS — M25532 Pain in left wrist: Secondary | ICD-10-CM

## 2017-05-29 NOTE — Progress Notes (Signed)
OCCUPATIONAL THERAPY INITIAL EVALUATION      GENERAL VISIT INFO    Diagnosis: s/p Left carpal tunnel release on 05/25/17    Therapy diagnosis: Pain and stiffness left hand/wrist    History: Patient is a 26 year old right hand dominant female who underwent left carpal tunnel release on 05/25/17. She is known to me for prior right carpal tunnel release on 05/07/15. She is currently not working, taking care of the house while her husband travels for work. Her father is visiting and helping after surgery for the next 1-2 weeks.     Encounter Diagnoses   Name Primary?   . Left wrist pain Yes   . Stiffness of left wrist joint    . Surgical aftercare, musculoskeletal system      Referring Provider: Vita Salinas, Kristen Iming, MD  Referring Provider NPI: [id number]  Reason for referral/Medical necessity: Please evaluate and treat, including: Services as deemed appropriate by the therapist.    VISIT COUNT   1    EVALUATION    General Assessment  Date of Symptom Onset*: 05/25/17  Start of Care Date*: 05/29/17  Reason for Referral*: see OT referral  Date of Surgery: 05/25/17  Type of Surgery: Left carpal tunnel release  Precautions: No heavy lifting x 4 weeks  Precaution From: 05/29/17  Precaution To: 06/26/17  Pain Score: 2 (Constant burning pain in palm)  Aggravating Factors: gripping, pinching, ROM and stretching  Relieving Factors: rest, meds  Body Part/Category: Left UE  Have you fallen in the past year?: No  Are you afraid of falling?: No  Issues with walking/balance/feeling unsteady: No  Fall Screen Date: 05/29/17  Based on screening, patient's fall risk is no.    Prior Level of Function  Lives With: Spouse  Receives Help From: Family  Vocational: Currently not working  Prior Function Comments: Prior to onset of left carpal tunnel syndrome, pt reports no functional deficits in her left UE.     Past Medical History:   Diagnosis Date   . Anxiety    . Chest pain    . Depression    . Disorder of menstrual bleeding    . Frequent use  of laxatives    . Gastric ulcer    . GERD (gastroesophageal reflux disease)    . Headache    . Heart burn    . Heart murmur    . History of irritable bowel syndrome    . Indigestion    . Nausea and vomiting    . Obesity    . Pap smear abnormality of cervix    . Vulvar vestibulitis 11/26/2014    [place] Dr Kristen Salinas[place]       Past Surgical History:   Procedure Laterality Date   . CARPAL TUNNEL RELEASE     . COLONOSCOPY STOMA DX INCLUDING COLLJ SPEC SPX     . ESOPHAGOGASTRODUODENOSCOPY         Social History     Social History Narrative    Lives with boyfriend    5 yo terrier Lily    No kids         ASSESSMENT    Previous Interventions: None    Barriers to learning: none    Preferred learning style: demonstration, written and verbal    Rehabilitation potential is: Excellent      TREATMENT AND PLAN    Skilled services/Interventions provided: Removed post-operative dressings and observed incision to be clean and dry with sutures intact. No purulent drainage  or signs/symptoms of infection at this time. Patient instructed in wound care, and applied adaptic and roll gauze. Mild-moderate edema noted throughout left wrist, and patient fit with size D tubigrip for edema management. Patient reports good overall relief of left median nerve paresthesia since surgery. Patient instructed in gentle active range of motion exercises for wrist extension/flexion and composite hook/fist and long fist. Range of motion measurements taken. Patient instructed per post-surgical precautions, home exercise program, and the importance of slow return to activities as tolerated once incision is fully healed. Patient verbalized understanding of all instructions today.     Range of Motion:   - Left wrist extension/flexion 45/40 vs Right 65/65  Strength: Not tested due to precautions   Wound status: Incision has sutures intact, no signs and symptoms of infection at this time.   Sensation: Improving paresthesia in left median nerve  distribution     Assessment: Patient is presenting 4 days s/p left carpal tunnel release with improving overall left median nerve paresthesia. Patient Kristen likely benefit from skilled occupational therapy (follow up in one month, then as needed afterwards) to maximize range of motion, strength and functional use of her left upper extremity.       Education  Engineer, production: Patient  Preferred Language: English  Education Preference: Verbal;Demonstration;Pictures/Handout  Method of Education: Verbal;Demonstration;Handout  Response to Education: Demonstrates independently  Barriers to Education: None  Cultural practices that influence care: None    Therapy Goals  Patient Goals: Regain full ROM, strength, sensation and functional use of her L UE.   Potential Barriers to Achieving Goals: None Defined    Discharge Plan  Planned Interventions: Scar management  Therapy Review Date: 07/28/17  Therapy Frequency: 1 mo follow-up  Anticipated Discharge Date: 07/28/17    Plan of Care:   Frequency: Follow up in one month, then as needed afterwards   Duration: 2 visits   Time: 30 minutes per visit    Long-term functional goals: Patient able to open new/tight jar lids, do heavy household chores, use a knife to cut food by 07/28/17.    These goals were discussed and the patient agrees with them.    Patient Education: verbal instructions and demonstrations were provided for the above exercises. The patient was able to demonstrate the exercises after instructions.    Interventions/Treatment provided:  Evaluations and Re-Evalulations  OCCUPATIONAL THERAPY EVAL LOW COMPLEX 97165: 1 Procedure           Interventions for next therapy session: Treatment Kristen consist of 1 visit(s) in one month, then as needed afterwards for a total of 6 weeks weeks, and Kristen focus on progression of range of motion, strengthening and functional use of her left: therapeutic exercises, manual therapy, ultrasound, scar management.        Kristen Salinas, Victorino Dike  K

## 2017-06-05 ENCOUNTER — Encounter (INDEPENDENT_AMBULATORY_CARE_PROVIDER_SITE_OTHER): Payer: Self-pay | Admitting: Hand Surgery

## 2017-06-05 ENCOUNTER — Ambulatory Visit: Payer: No Typology Code available for payment source | Admitting: Hand Surgery

## 2017-06-05 VITALS — BP 126/82 | HR 98 | Ht 63.0 in | Wt 260.0 lb

## 2017-06-05 DIAGNOSIS — G5602 Carpal tunnel syndrome, left upper limb: Secondary | ICD-10-CM

## 2017-06-05 NOTE — Progress Notes (Signed)
Janae BridgemanJessica Nicole Shisler is a 26 year old female who is now 1.5 weeks s/p left open carpal tunnel release. Her surgery date was 05/25/2017. She currently rates her pain as 3 out of 10. She denies any numbness in the left hand. denies any fevers or chills and denies erythema or purulent drainage.    EXAM  Incision is clean and dry, intact, no dehiscence.  Distal portion of the incision not completely healed  Full composite flexion and extension all fingers L hand      IMPRESSION  1.5 weeks s/p left open carpal tunnel release.  Doing well.      PLAN  Sutures removed in clinic today.  Steri strips applied  Referral to hand therapy.  Activities as tolerated  Follow-up prn        Azucena FallenJerry I. Alix Lahmann, MD  Associate Professor  [facility]  Department of Orthopaedics and Sports Medicine  Hand, Wrist, and Elbow Surgery

## 2017-06-05 NOTE — Progress Notes (Signed)
5 Sutures were removed from pt's left  hand. No visible signs of infection at the wound site. Wound was cleaned with alcohol prep.  Pt tolerated the procedure well.

## 2017-06-26 ENCOUNTER — Encounter (INDEPENDENT_AMBULATORY_CARE_PROVIDER_SITE_OTHER): Payer: No Typology Code available for payment source | Admitting: Hand Surgery

## 2017-06-30 ENCOUNTER — Ambulatory Visit
Payer: No Typology Code available for payment source | Attending: Hand Surgery | Admitting: Rehabilitative and Restorative Service Providers"

## 2017-06-30 DIAGNOSIS — M79642 Pain in left hand: Secondary | ICD-10-CM

## 2017-06-30 DIAGNOSIS — M25642 Stiffness of left hand, not elsewhere classified: Secondary | ICD-10-CM

## 2017-06-30 DIAGNOSIS — M25532 Pain in left wrist: Secondary | ICD-10-CM | POA: Insufficient documentation

## 2017-06-30 NOTE — Progress Notes (Signed)
OCCUPATIONAL THERAPY DAILY NOTE      GENERAL VISIT INFO    Diagnosis: s/p Left carpal tunnel release on 05/25/17    Therapy diagnosis: Pain and stiffness left hand/wrist    Encounter Diagnoses   Name Primary?   . Left wrist pain Yes   . Left hand pain    . Stiffness of left hand joint      Referring Provider: Vita ErmHuang, Jerry Iming, MD  Referring Provider NPI: [id number]    VISIT COUNT   2    EVALUATION      Past Medical History:   Diagnosis Date   . Anxiety    . Chest pain    . Depression    . Disorder of menstrual bleeding    . Frequent use of laxatives    . Gastric ulcer    . GERD (gastroesophageal reflux disease)    . Headache    . Heart burn    . Heart murmur    . History of irritable bowel syndrome    . Indigestion    . Nausea and vomiting    . Obesity    . Pap smear abnormality of cervix    . Vulvar vestibulitis 11/26/2014    [place] Dr Will Bon[place]Ekert/[place]       Past Surgical History:   Procedure Laterality Date   . CARPAL TUNNEL RELEASE     . COLONOSCOPY STOMA DX INCLUDING COLLJ SPEC SPX     . ESOPHAGOGASTRODUODENOSCOPY         Social History     Social History Narrative    Lives with boyfriend    5 yo terrier Lily    No kids       TREATMENT/OBJECTIVE    Pain: The patient is reporting 2/10 level of pain at rest that is constant ache, upwards of 6/10 with light-moderate gripping and lifting, on a 0-10 Numeric Pain Distress Scale.    Subjective: She reports pillar pain, and that she is unable to lift over about 5-6 lbs due to pain at this time.    Skilled services/Interventions provided: Assessed current status. Ultrasound to left volar hand and wrist (100% continuous, 3 mHz, 1.0 watts per cm2, 5 minutes) to increase tissue elasticity, increase circulation and decrease pain. Soft tissue mobilization to left hand, wrist and forearm with scar massage to incision to break up adhesions, increase circulation and decrease pain. Dense scar tissue noted along incision that is painful to pressure. Pain with pressure also  over thenar and hypothenar. Paraffin dip to left hand and wrist to increase circulation and decrease pain. Performed and instructed in gentle A/PROM exercises for her left wrist, as well as tendon gliding exercises, home exercise program and activity modification. Patient verbalized understanding of all instructions today.     Range of Motion: Full active range of motion left wrist. Left thumb opposition to small finger proximal phalanx with pain along ulnar side of hand.  Strength: Not tested due to pain.  Sensation: Patient reports left median nerve paresthesia has resolved since surgery.      ASSESSMENT/PLAN    Assessment: Patient is presenting with left hand and wrist pain, pillar pain at 5 weeks s/p left carpal tunnel release, with decreased strength and functional use of her left upper extremity. Patient will likely benefit from skilled occupational therapy to maximize range of motion, strength and functional use of her left upper extremity.     Long-term functional goals: Patient able to open new/tight jar lids, do  heavy household chores, use a knife to cut food by 07/28/17.    Patient Education: verbal instructions and demonstrations were provided for the above exercises. The patient was able to demonstrate the exercises after instructions.    Interventions/Treatment provided:  Therapeutic Procedures  THERAPEUTIC EXERCISE 1 OR >1 AREAS 97110 - Minutes: 15  MANUAL THERAPY 1 OR >1 REGIONS 97140 - Minutes: 25        Interventions for next therapy session: Treatment will consist of 1 visit(s) every other week for 8 weeks, and will focus on progression of range of motion, strengthening and functional use of her left upper extremity: therapeutic exercises, manual therapy, ultrasound, scar management.       Carlyn Lemke, Victorino DikeJENNIFER K

## 2017-07-21 ENCOUNTER — Encounter (HOSPITAL_BASED_OUTPATIENT_CLINIC_OR_DEPARTMENT_OTHER): Payer: No Typology Code available for payment source | Admitting: Rehabilitative and Restorative Service Providers"

## 2017-08-04 ENCOUNTER — Encounter (HOSPITAL_BASED_OUTPATIENT_CLINIC_OR_DEPARTMENT_OTHER): Payer: No Typology Code available for payment source | Admitting: Rehabilitative and Restorative Service Providers"

## 2018-04-05 ENCOUNTER — Inpatient Hospital Stay: Payer: Self-pay

## 2018-04-25 ENCOUNTER — Inpatient Hospital Stay: Payer: Self-pay

## 2018-04-26 ENCOUNTER — Inpatient Hospital Stay: Payer: Self-pay

## 2018-05-05 ENCOUNTER — Inpatient Hospital Stay: Payer: Self-pay

## 2018-07-12 ENCOUNTER — Inpatient Hospital Stay: Payer: Self-pay

## 2018-07-13 ENCOUNTER — Inpatient Hospital Stay: Payer: Self-pay

## 2018-07-21 ENCOUNTER — Inpatient Hospital Stay: Payer: Self-pay

## 2018-07-24 ENCOUNTER — Inpatient Hospital Stay: Payer: Self-pay

## 2018-08-31 ENCOUNTER — Inpatient Hospital Stay: Payer: Self-pay

## 2018-10-03 ENCOUNTER — Inpatient Hospital Stay: Payer: Self-pay

## 2018-10-18 NOTE — Progress Notes (Signed)
Department: Population Health Management  Insurer: [facility] Gaps due: Cervical cancer screening     No patient outreach needed based on thorough chart review. Chart Review indicates PT is no longer seeking primary care at [facility] based on records found in Care Everywhere from [facility]. Documenting PT info for department purposes and routing to appropriate management to notify that the PT has confirmed not [facility].    Closing Encounter.

## 2019-02-27 ENCOUNTER — Inpatient Hospital Stay: Payer: Self-pay

## 2019-03-11 ENCOUNTER — Other Ambulatory Visit (HOSPITAL_COMMUNITY): Payer: Self-pay

## 2019-04-26 ENCOUNTER — Encounter (INDEPENDENT_AMBULATORY_CARE_PROVIDER_SITE_OTHER): Payer: Self-pay

## 2019-07-11 ENCOUNTER — Other Ambulatory Visit (HOSPITAL_COMMUNITY): Payer: Self-pay

## 2020-02-15 ENCOUNTER — Emergency Department: Payer: Self-pay

## 2020-02-18 ENCOUNTER — Other Ambulatory Visit (HOSPITAL_COMMUNITY): Payer: Self-pay

## 2020-07-16 ENCOUNTER — Other Ambulatory Visit (HOSPITAL_COMMUNITY): Payer: Self-pay

## 2020-07-17 ENCOUNTER — Other Ambulatory Visit (HOSPITAL_COMMUNITY): Payer: Self-pay

## 2020-07-21 ENCOUNTER — Ambulatory Visit (HOSPITAL_BASED_OUTPATIENT_CLINIC_OR_DEPARTMENT_OTHER): Payer: No Typology Code available for payment source | Admitting: Family Medicine

## 2020-08-04 ENCOUNTER — Encounter (HOSPITAL_BASED_OUTPATIENT_CLINIC_OR_DEPARTMENT_OTHER): Payer: Self-pay | Admitting: Family Medicine

## 2020-08-28 ENCOUNTER — Encounter (HOSPITAL_BASED_OUTPATIENT_CLINIC_OR_DEPARTMENT_OTHER): Payer: No Typology Code available for payment source | Admitting: Family Medicine

## 2020-08-31 ENCOUNTER — Encounter (HOSPITAL_BASED_OUTPATIENT_CLINIC_OR_DEPARTMENT_OTHER): Payer: Self-pay | Admitting: Family Medicine

## 2020-09-27 IMAGING — MR MRI RIGHT ANKLE WITHOUT CONTRAST
4 series · 22 of 40 positions shown · IV contrast (gadolinium)
Comparison: None.

OXENDINE, TANU 
MRI RIGHT ANKLE WITHOUT CONTRAST, 09/27/2020 [DATE]: 
CLINICAL INDICATION: Achilles tendinitis. Pain. Tendinitis for 2 years after 
taking Levaquin and steroids.
TECHNIQUE: Multiplanar, multiecho position MR images of the ankle were performed 
without intravenous gadolinium enhancement.

[Series 301: survey_right · axial · 10.0mm · 1.17mm/px · z∈[-84,+85]mm · 3 of 9 slices shown]
[im 1/9]
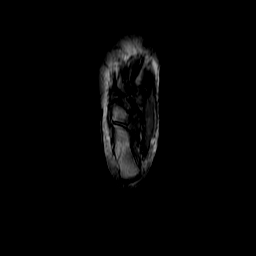
[im 5/9]
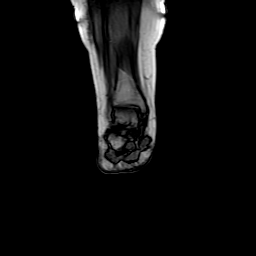
[im 9/9]
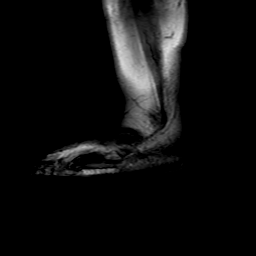

[Series 401: (person_name)_(person_name)_(person_name) · axial · 3.0mm · 0.34mm/px · z∈[-112,+48]mm · 11 of 55 slices shown]
[im 4/55]
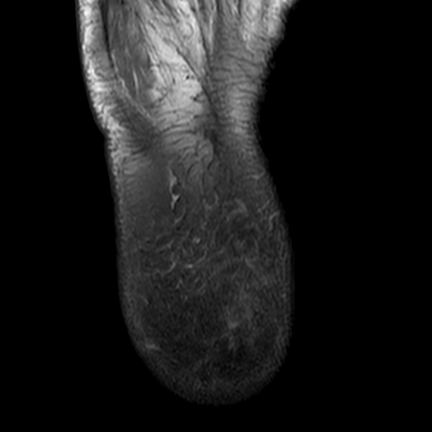
[im 7/55]
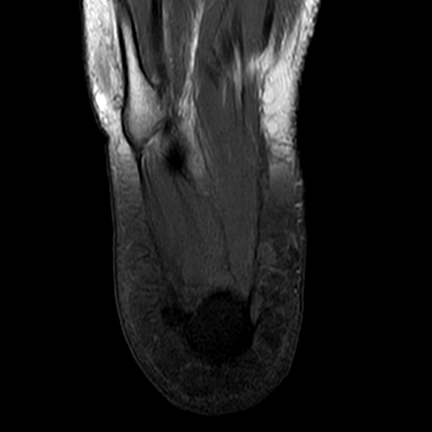
[im 11/55]
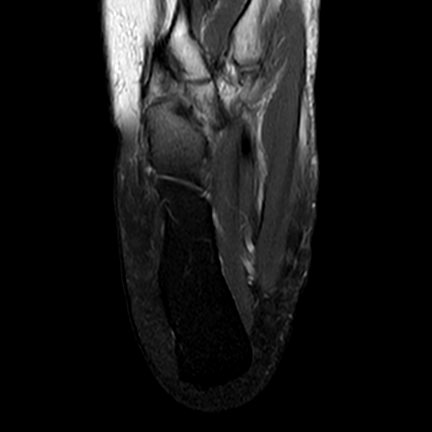
[im 17/55]
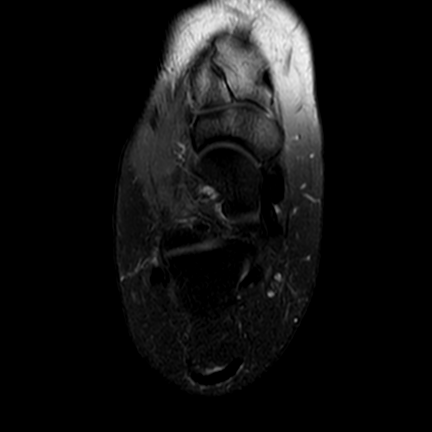
[im 24/55]
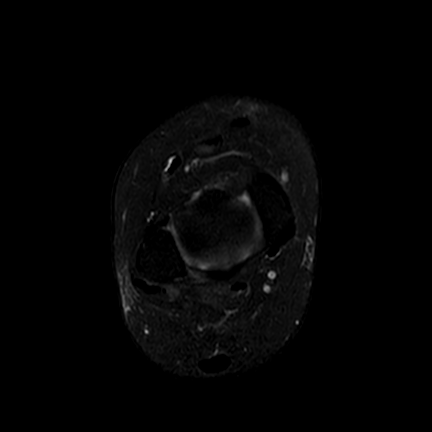
[im 28/55]
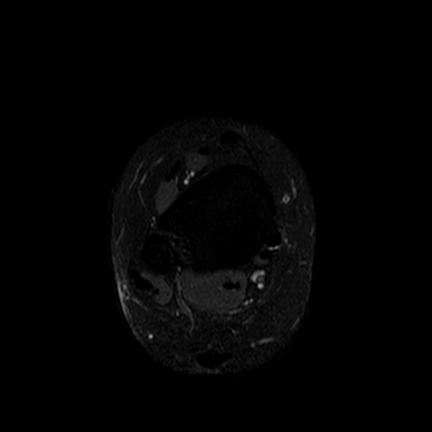
[im 31/55]
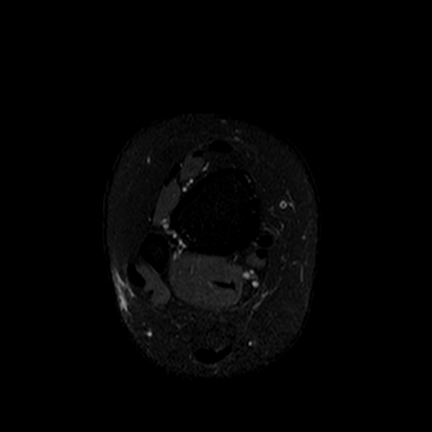
[im 38/55]
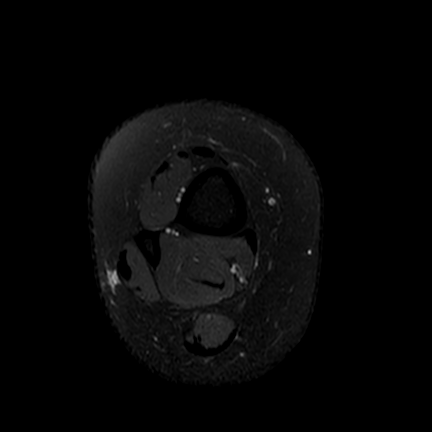
[im 44/55]
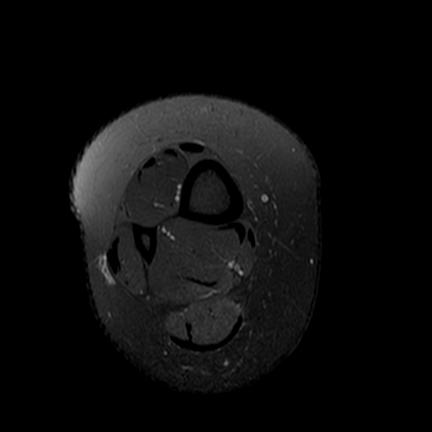
[im 48/55]
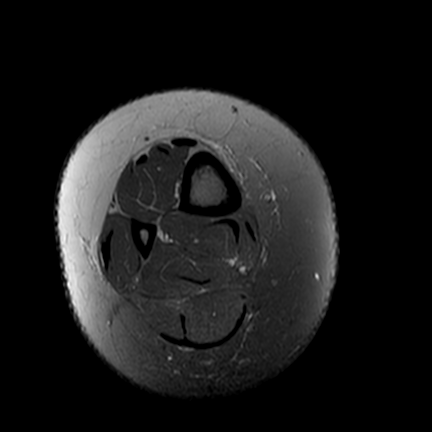
[im 51/55]
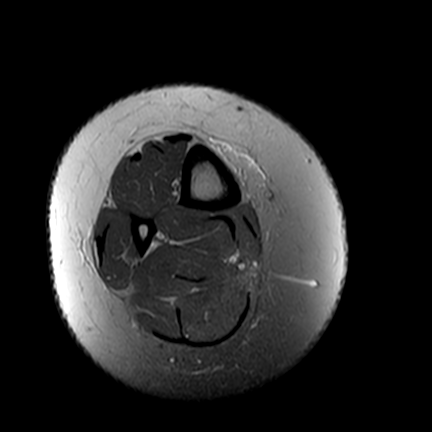

[Series 701: t1_sag · sagittal · 2.5mm · 0.28mm/px · 5 of 30 slices shown]
[im 1/30]
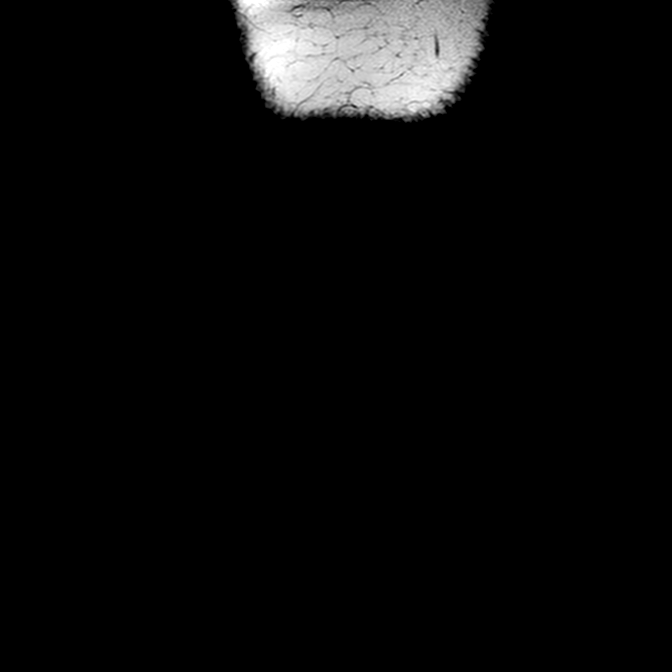
[im 4/30]
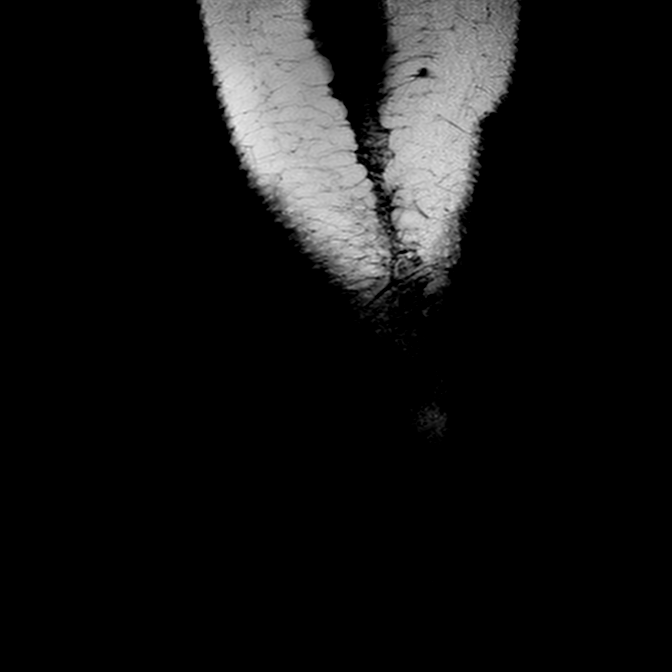
[im 8/30]
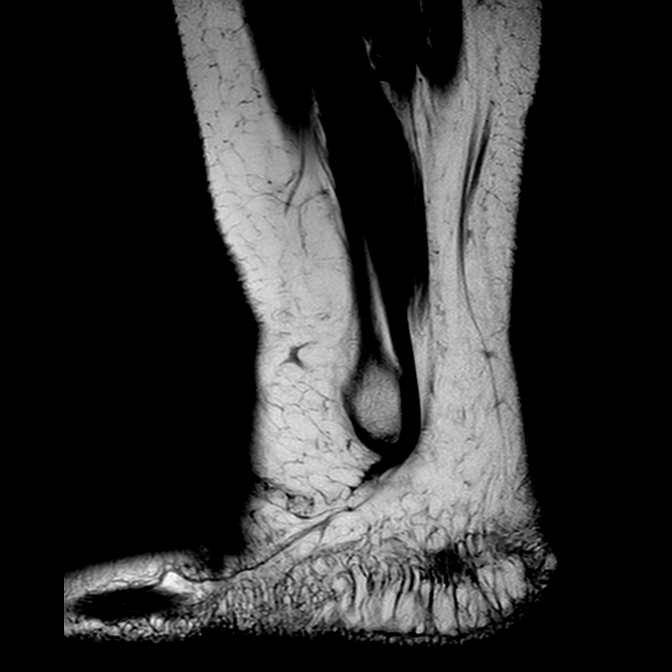
[im 15/30]
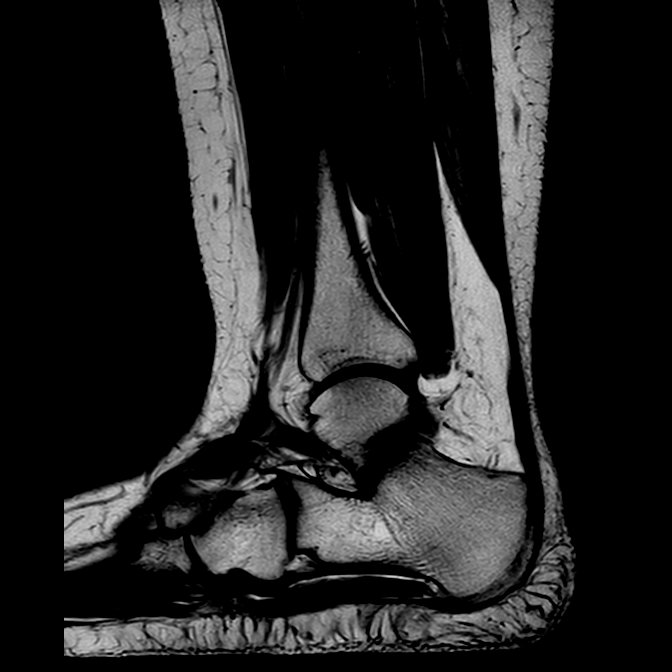
[im 26/30]
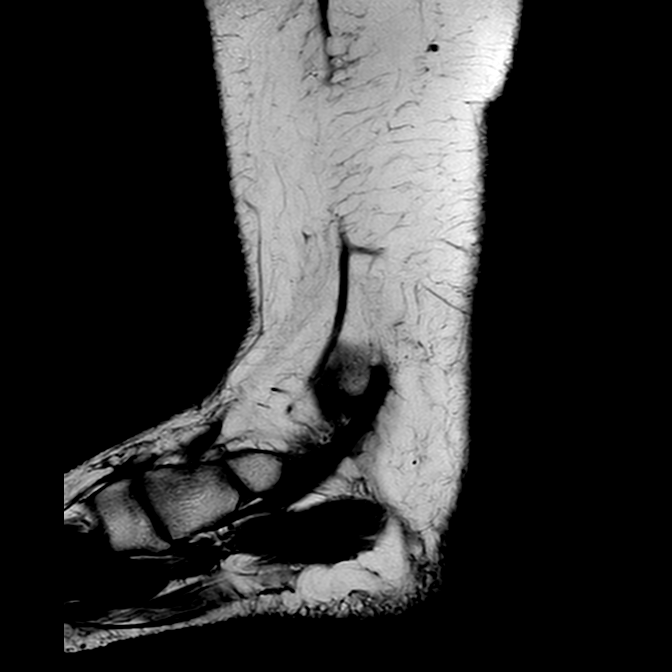

[Series 801: pd_fs_cor · coronal · 3.0mm · 0.45mm/px · 3 of 37 slices shown]
[im 4/37]
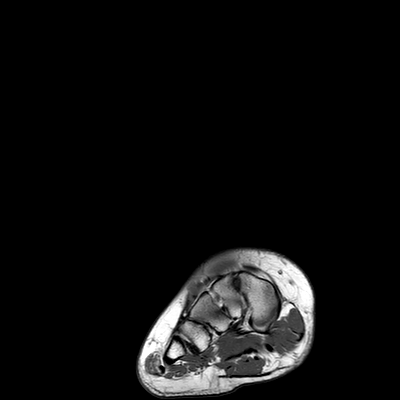
[im 19/37]
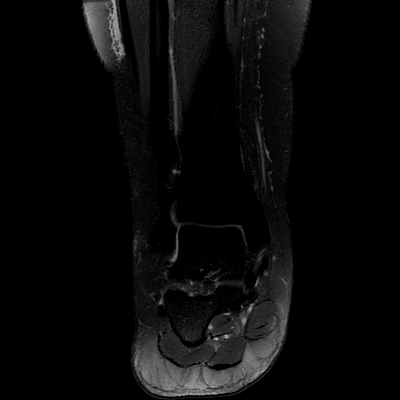
[im 33/37]
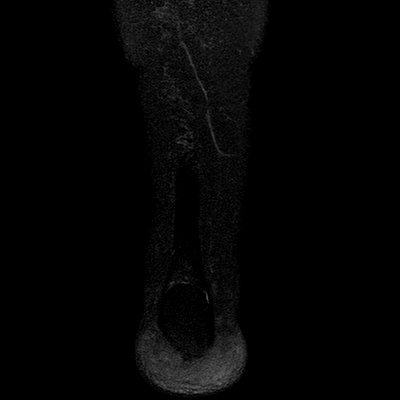

[22 of 40 positions shown; findings below may reference images not displayed]

FINDINGS: TENDONS: There is trace fluid within the peroneal, posterior tibialis, extensor 
digitorum tendon sheaths and trace peritendinous edema about the distal Achilles 
tendon. The Achilles, flexor, extensor and peroneal tendons are otherwise 
intact. No tendon tear or tendinopathy. No tendon subluxation. 
LIGAMENTS: 
LATERAL LIGAMENTS: The anterior talofibular ligament is intact. The 
calcaneofibular ligament and posterior talofibular ligament are preserved. 
SYNDESMOTIC LIGAMENTS: The anterior and posterior tibiofibular and interosseous 
ligaments are preserved. 
DELTOID LIGAMENTOUS COMPLEX: The deep and superficial components of the deltoid 
ligamentous complex are intact. 
SINUS TARSI LIGAMENTS: The cervical and interosseous ligaments are preserved. 
The inferior extensor retinaculum appears intact. 
SALMATA LIGAMENTOUS COMPLEX: Plantar calcaneonavicular ligament preserved. 
BONES AND JOINTS: Normal bone marrow signal intensity. No fracture, contusion or 
stress response. The talar dome is preserved. No focal osteochondral lesion. 
Articular cartilage is preserved. 
ADDITIONAL FINDINGS: Musculature is symmetric without mass, signal abnormality 
or atrophy. Sinus tarsi fat is preserved. Plantar fascia is intact. Tarsal 
tunnel is preserved. Subcutaneous tissues are negative.
IMPRESSION: Trace tenosynovitis and trace peritendinous edema about the distal Achilles 
tendon. 
Otherwise, negative MRI examination of the right ankle.

## 2020-10-09 IMAGING — MR MRI RIGHT SHOULDER WITHOUT CONTRAST
5 of 6 series · 25 of 40 positions shown · IV contrast (gadolinium)
Comparison: None

MRI RIGHT SHOULDER WITHOUT CONTRAST, 10/09/2020 [DATE]: 
CLINICAL INDICATION: Right shoulder pain for over one year. No injury.
TECHNIQUE: Multiplanar, multiecho position MR images of the shoulder were 
performed without intravenous gadolinium enhancement.

[Series 101: survey_fullfov_transversal · axial · 10.0mm · 1.84mm/px · z∈[-51,+51]mm · 2 of 7 slices shown]
[im 1/7]
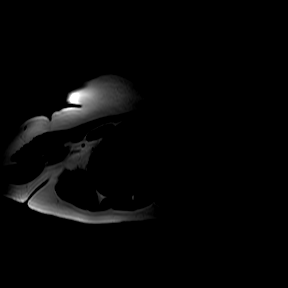
[im 7/7]
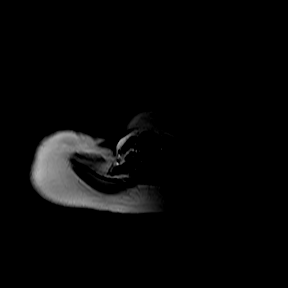

[Series 201: survey mst · axial · 10.0mm · 0.99mm/px · z∈[-40,+175]mm · 5 of 15 slices shown]
[im 1/15]
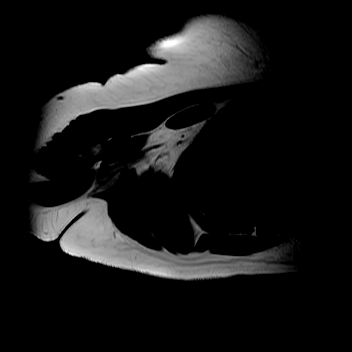
[im 4/15]
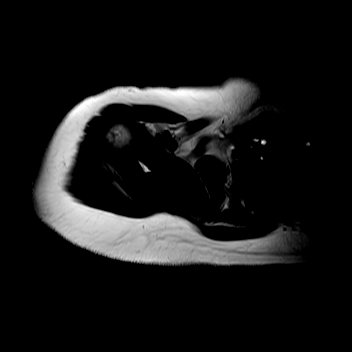
[im 8/15]
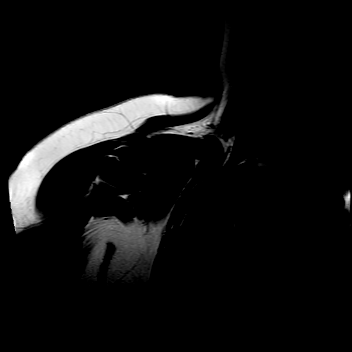
[im 11/15]
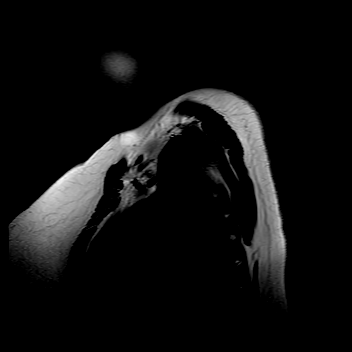
[im 15/15]
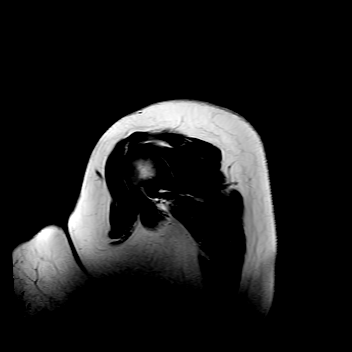

[Series 301: (person_name)_(person_name)_(person_name)* · axial · 3.0mm · 0.35mm/px · z∈[-18,+68]mm · 8 of 28 slices shown]
[im 1/28]
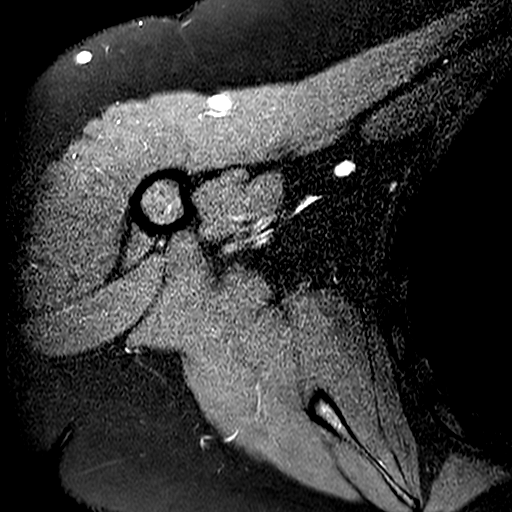
[im 4/28]
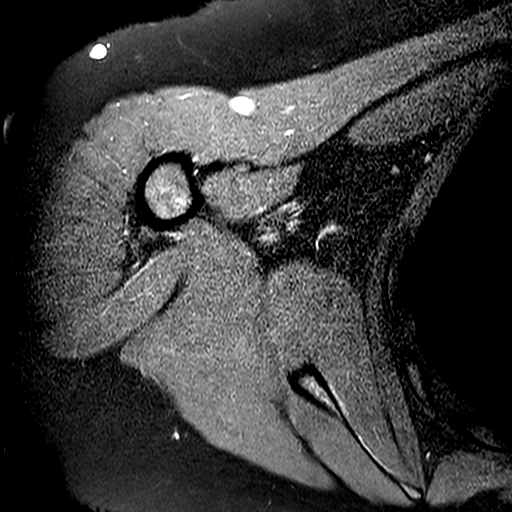
[im 8/28]
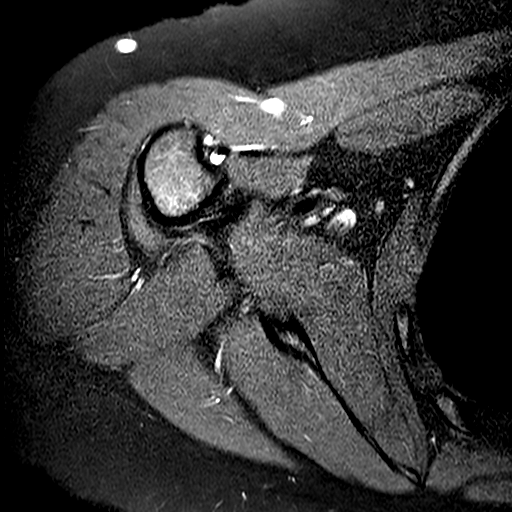
[im 12/28]
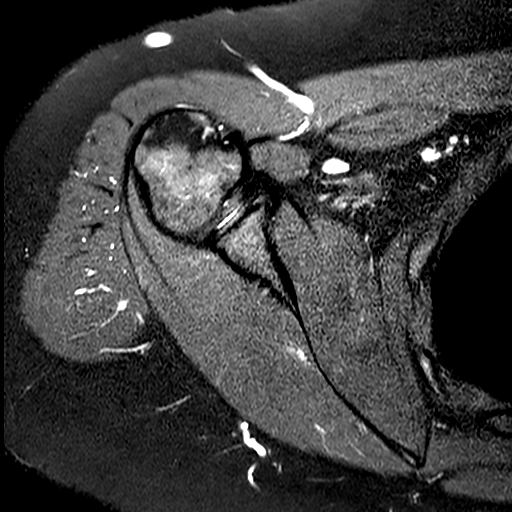
[im 16/28]
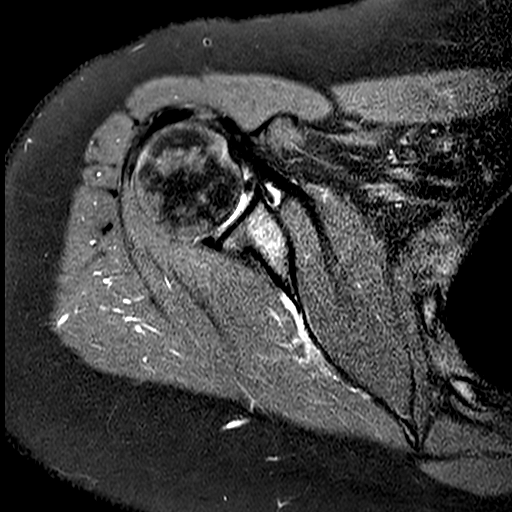
[im 20/28]
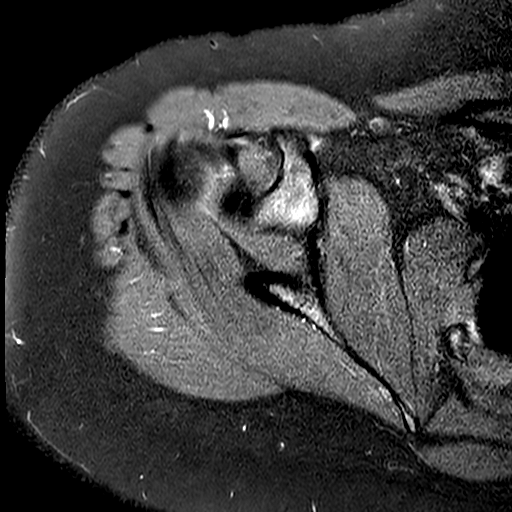
[im 24/28]
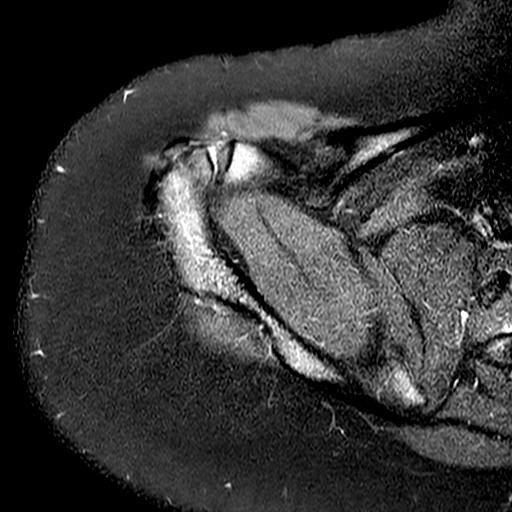
[im 28/28]
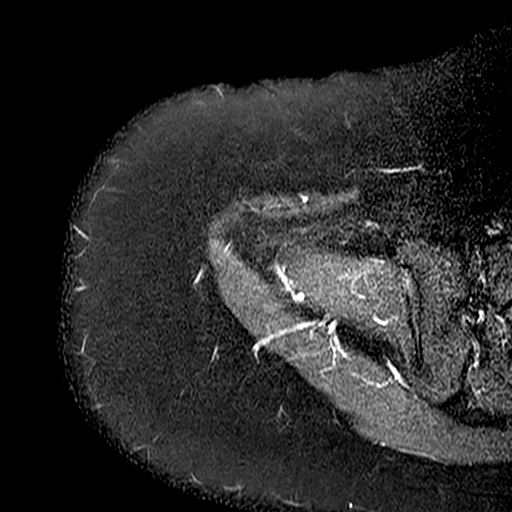

[Series 401: t2_fs_sag* · coronal · 3.0mm · 0.37mm/px · 9 of 30 slices shown]
[im 1/30]
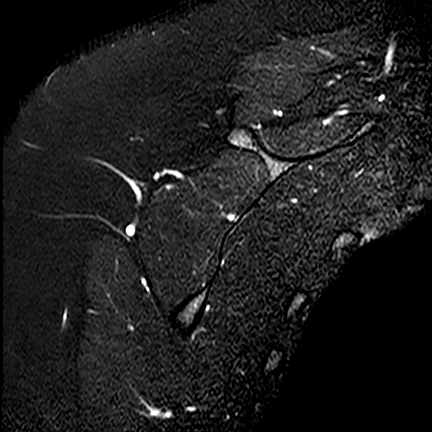
[im 4/30]
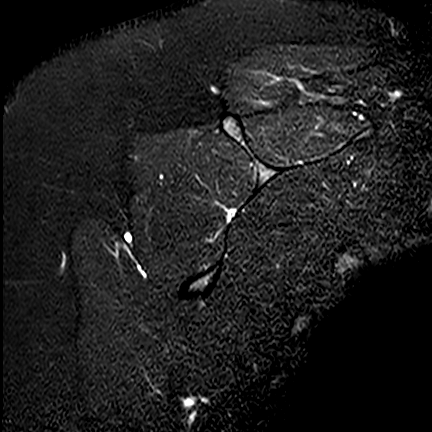
[im 8/30]
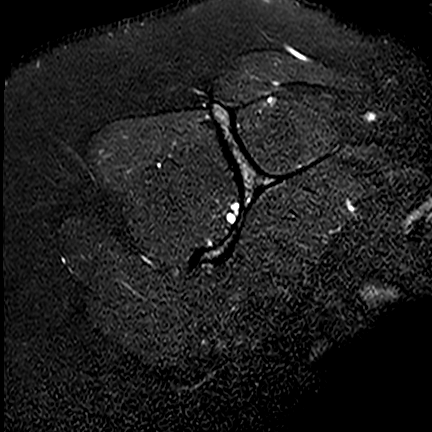
[im 11/30]
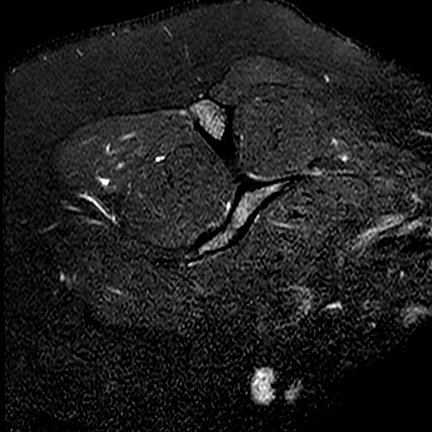
[im 15/30]
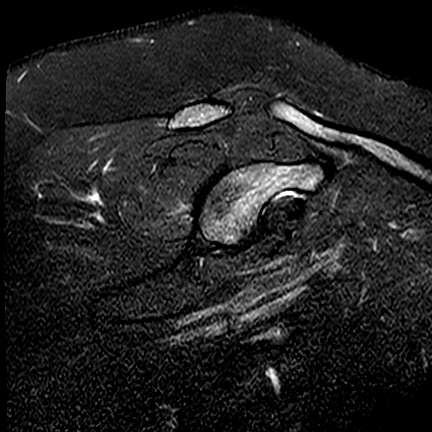
[im 19/30]
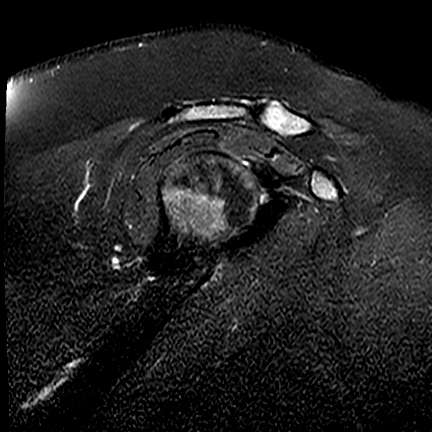
[im 22/30]
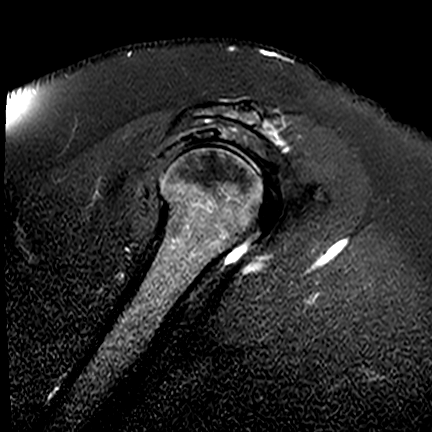
[im 26/30]
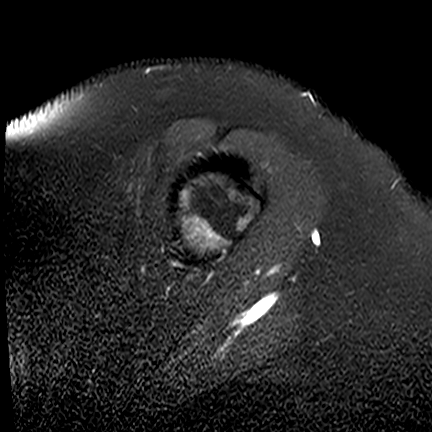
[im 30/30]
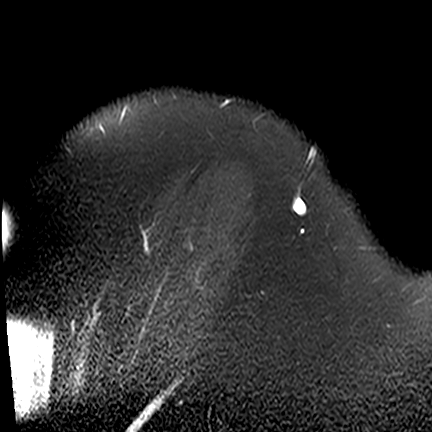

[Series 501: t1_sag* · coronal · 3.0mm · 0.29mm/px · 1 of 30 slices shown]
[im 1/30]
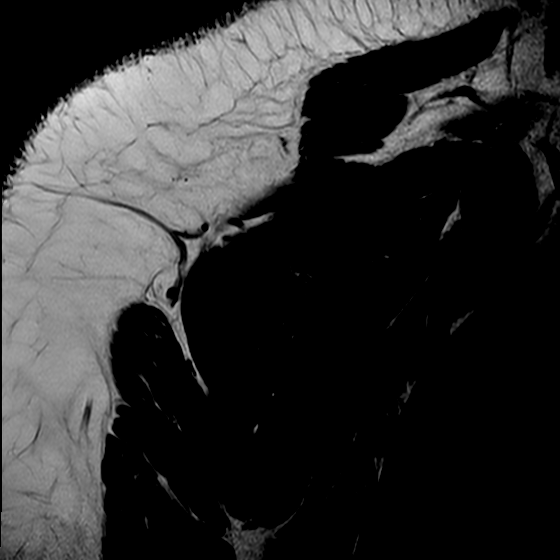

[25 of 40 positions shown; findings below may reference images not displayed]

FINDINGS: ROTATOR CUFF: The supraspinatus, infraspinatus, subscapularis and teres minor 
tendons are intact. The rotator cuff musculature is symmetric without mass, 
signal abnormality or atrophy. 
ACROMIOCLAVICULAR JOINT: Mild degenerative change of the acromioclavicular joint 
without mass effect upon the supraspinatus. The coracoacromial ligament is 
intact without prominent spurring at the acromial attachment. The 
acromioclavicular and coracoclavicular ligaments are preserved. The acromium is 
normal in morphology. 
GLENOHUMERAL JOINT: The humeral head is well located within the glenoid fossa. 
Articular cartilage is preserved.  The glenoid labrum is preserved. No 
paralabral cyst. The intra-articular portion of the long head of the biceps 
tendon is negative. No shoulder joint effusion. 
BONES: The bone marrow signal intensity is negative for fracture. No Hill-Sachs 
defect. Subcortical cystic change of the humeral head. 
ADDITIONAL FINDINGS: The axillary region is negative. Subcutaneous tissues are 
negative.
IMPRESSION: Normal right shoulder MRI.

## 2020-11-02 IMAGING — MR MRI RIGHT HIP WITHOUT CONTRAST
4 series · 28 of 40 positions shown · non-contrast
Comparison: None prior of the right hip.

MRI RIGHT HIP WITH AND WITHOUT CONTRAST, 11/02/2020 [DATE]: 
CLINICAL INDICATION: Right groin pain. Possible injury in shower bending over 
and felt and heard popping joints and right hip in August 2018. Possible labral 
tear.
TECHNIQUE: Multiplanar, multiecho position MR images of the right hip were 
performed prearthrogram only. Additionally, large field-of-view imaging was 
performed of the pelvis to include the left hip for comparison. At the time of 
the MR exam the length of required for arthrogram injection were unavailable.

[Series 201: survey · axial · 10.0mm · 1.14mm/px · z∈[-70,+199]mm · 6 of 11 slices shown]
[im 1/11]
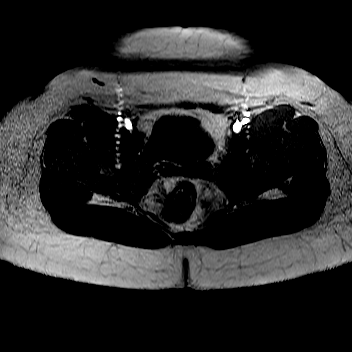
[im 3/11]
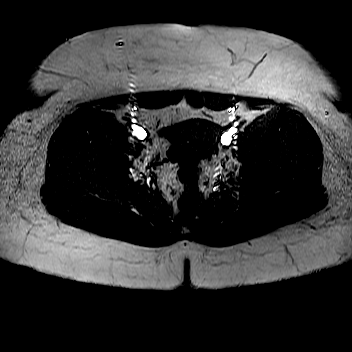
[im 5/11]
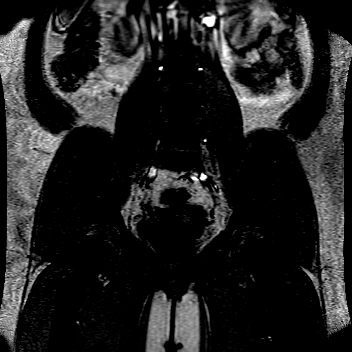
[im 7/11]
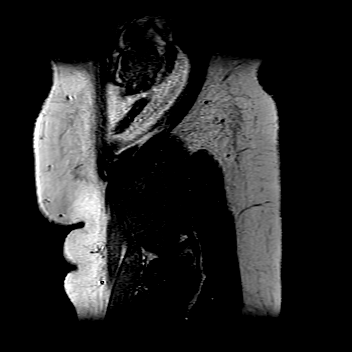
[im 9/11]
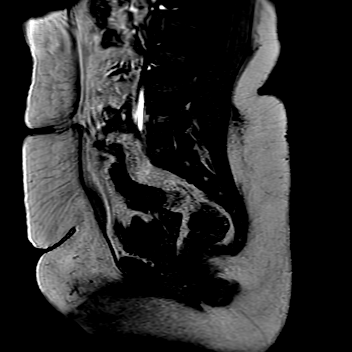
[im 11/11]
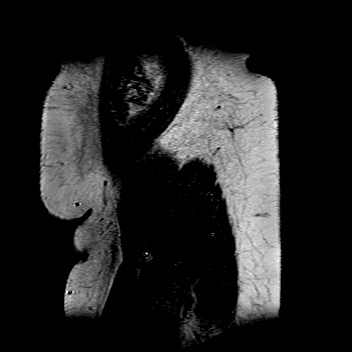

[Series 501: pd_fs_sag · sagittal · 4.0mm · 0.46mm/px · 9 of 26 slices shown]
[im 1/26]
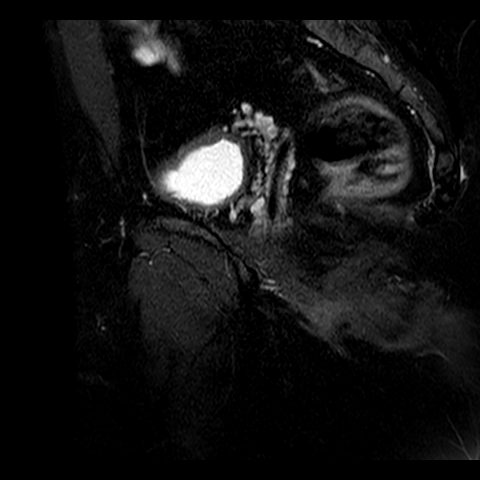
[im 5/26]
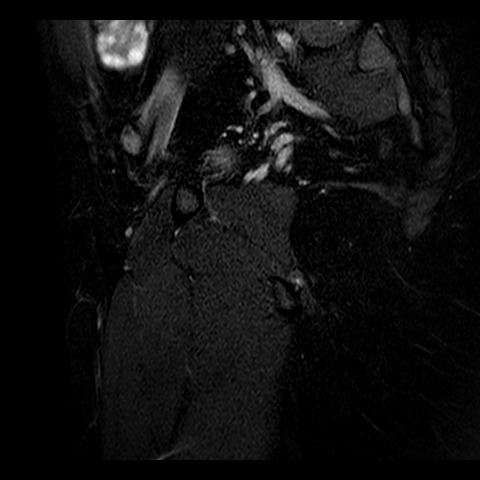
[im 7/26]
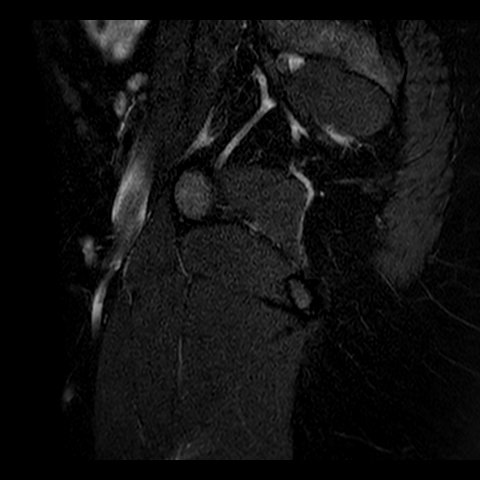
[im 12/26]
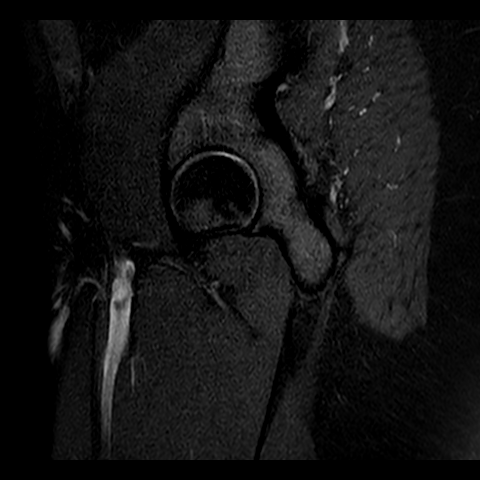
[im 14/26]
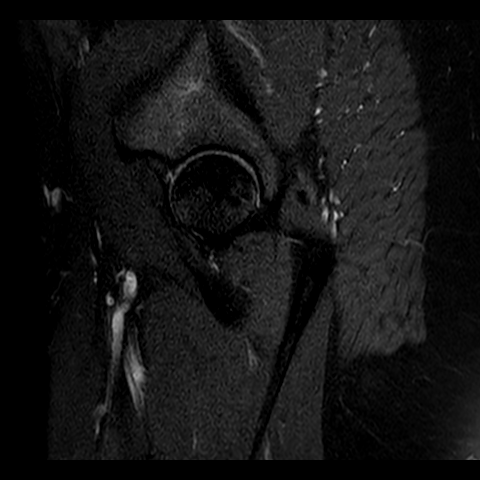
[im 19/26]
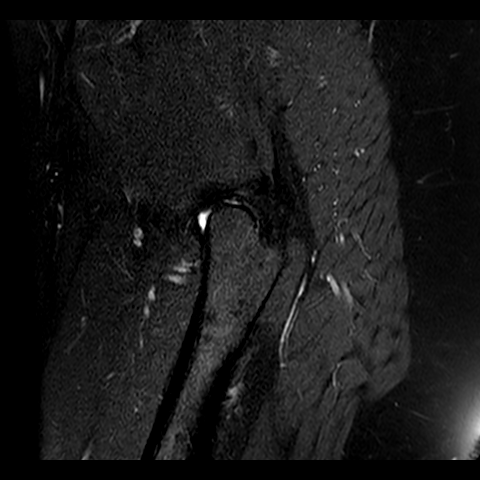
[im 21/26]
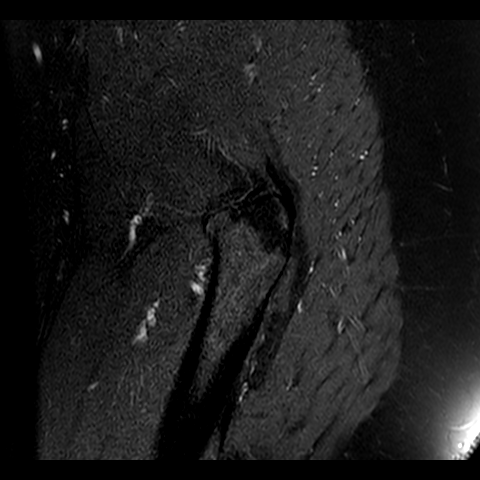
[im 23/26]
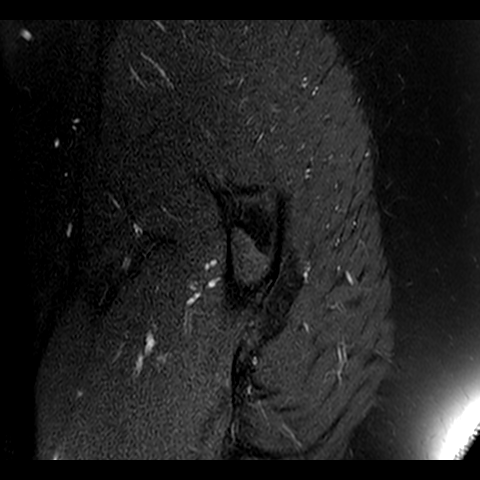
[im 26/26]
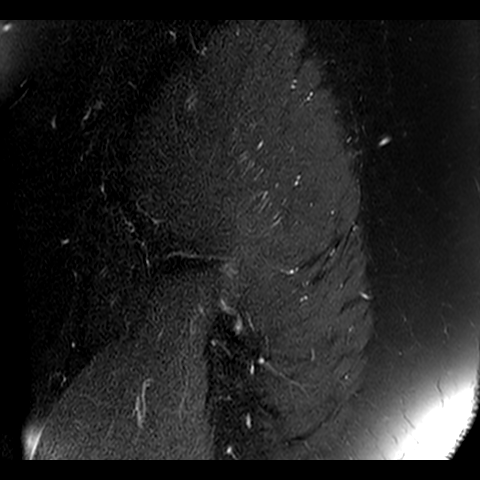

[Series 601: pd_fs_tra_ · axial · 4.0mm · 0.47mm/px · z∈[-176,-65]mm · 9 of 28 slices shown]
[im 1/28]
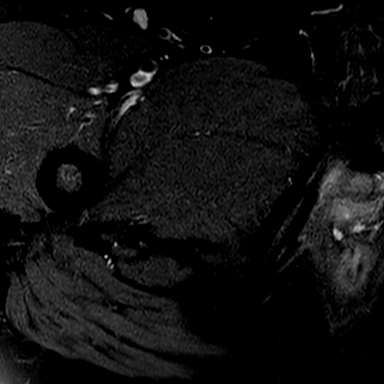
[im 5/28]
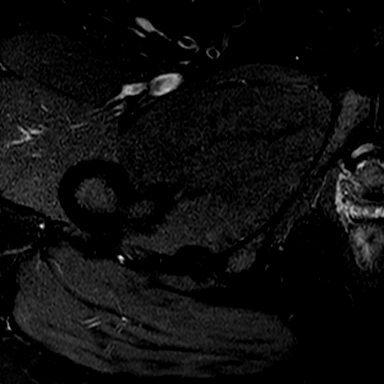
[im 10/28]
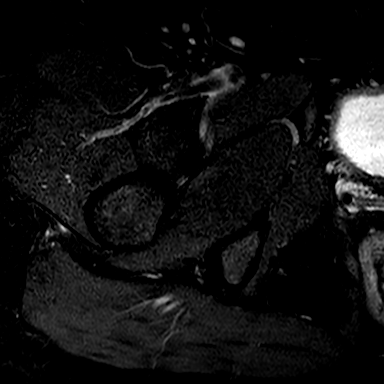
[im 12/28]
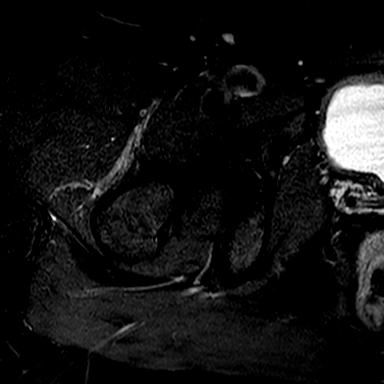
[im 14/28]
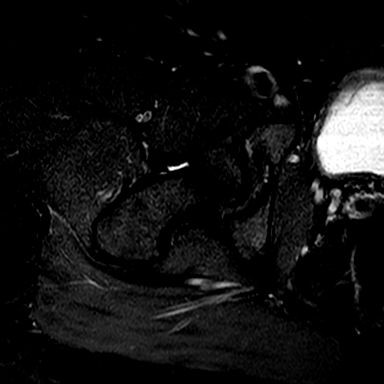
[im 16/28]
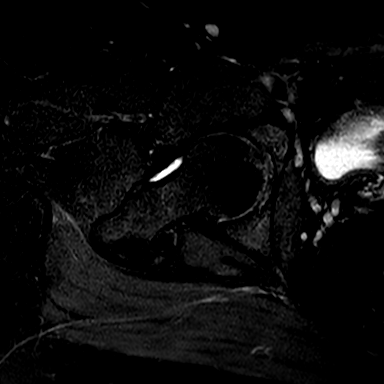
[im 19/28]
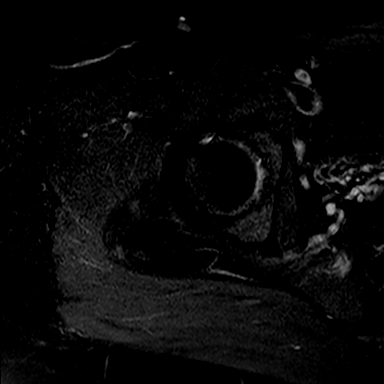
[im 23/28]
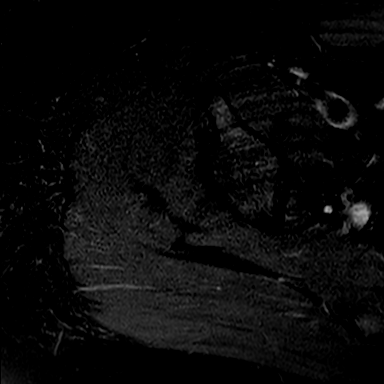
[im 28/28]
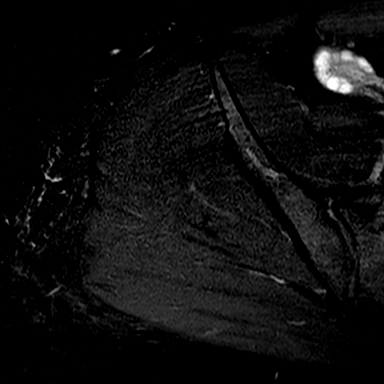

[Series 701: pd_fs_cor · coronal · 4.0mm · 0.49mm/px · 4 of 20 slices shown]
[im 1/20]
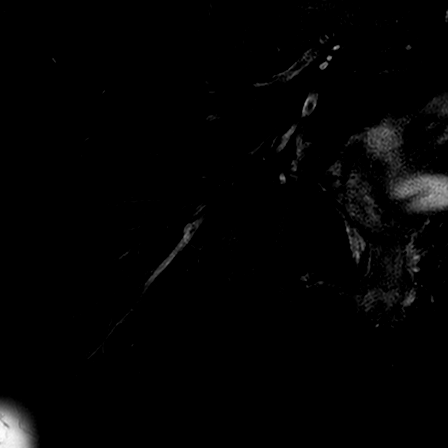
[im 3/20]
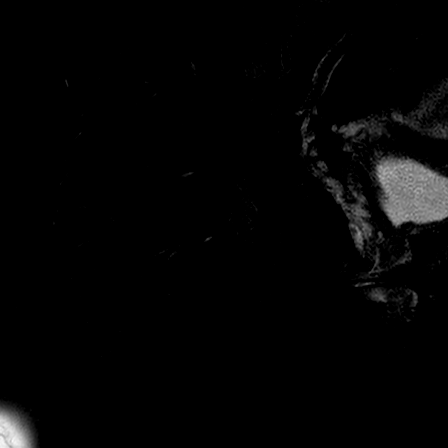
[im 10/20]
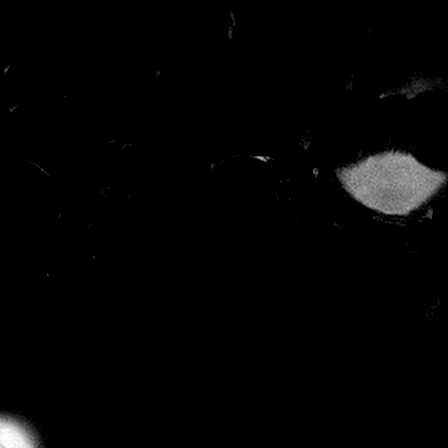
[im 17/20]
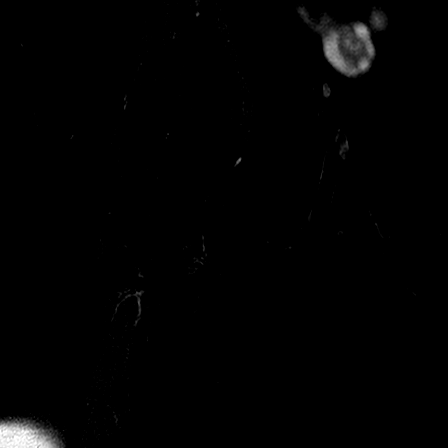

[28 of 40 positions shown; findings below may reference images not displayed]

FINDINGS: Bilateral hips: Both femoral heads maintain a spherical configuration without 
evidence of avascular necrosis or subarticular collapse. No discrete articular 
cartilaginous loss of either hip. Small field-of-view imaging was performed of 
the right hip. No labral tear. No paralabral cyst. No hip joint effusion. No 
abnormal morphology of the proximal femurs or acetabulum to predispose to 
impingement. 
Bones: Normal marrow signal intensity of the proximal hips, pelvis, sacrum and 
included lower lumbar spine. No fracture, contusion or marrow replacing lesion. 
Mild disc desiccation and disc height loss L5-S1. SI joints are preserved. 
Soft Tissues: The bilateral abductor cuffs are preserved. The insertions of the 
iliopsoas tendons are intact. The origins of the hamstrings are preserved. The 
musculature is symmetric without strain, atrophy or mass. No focal fluid 
collection or distended bursa. Specifically, no iliopsoas or trochanteric 
bursitis. Included neurovascular bundles are negative. Intrapelvic contents are 
negative.
IMPRESSION: 1.  Negative hips including small field-of-view imaging of the right hip. No 
labral tear. 
2.  Mild spondylotic changes included lower lumbar spine.

## 2023-11-28 IMAGING — DX KNEE 3 VIEWS LEFT
3 series · 3 of 3 positions shown · non-contrast
Comparison: None.

________________________________________________________________________________________________ 
KNEE 3 VIEWS LEFT, 11/28/2023 [DATE]: 
CLINICAL INDICATION: Pain In Left Knee

[AP]
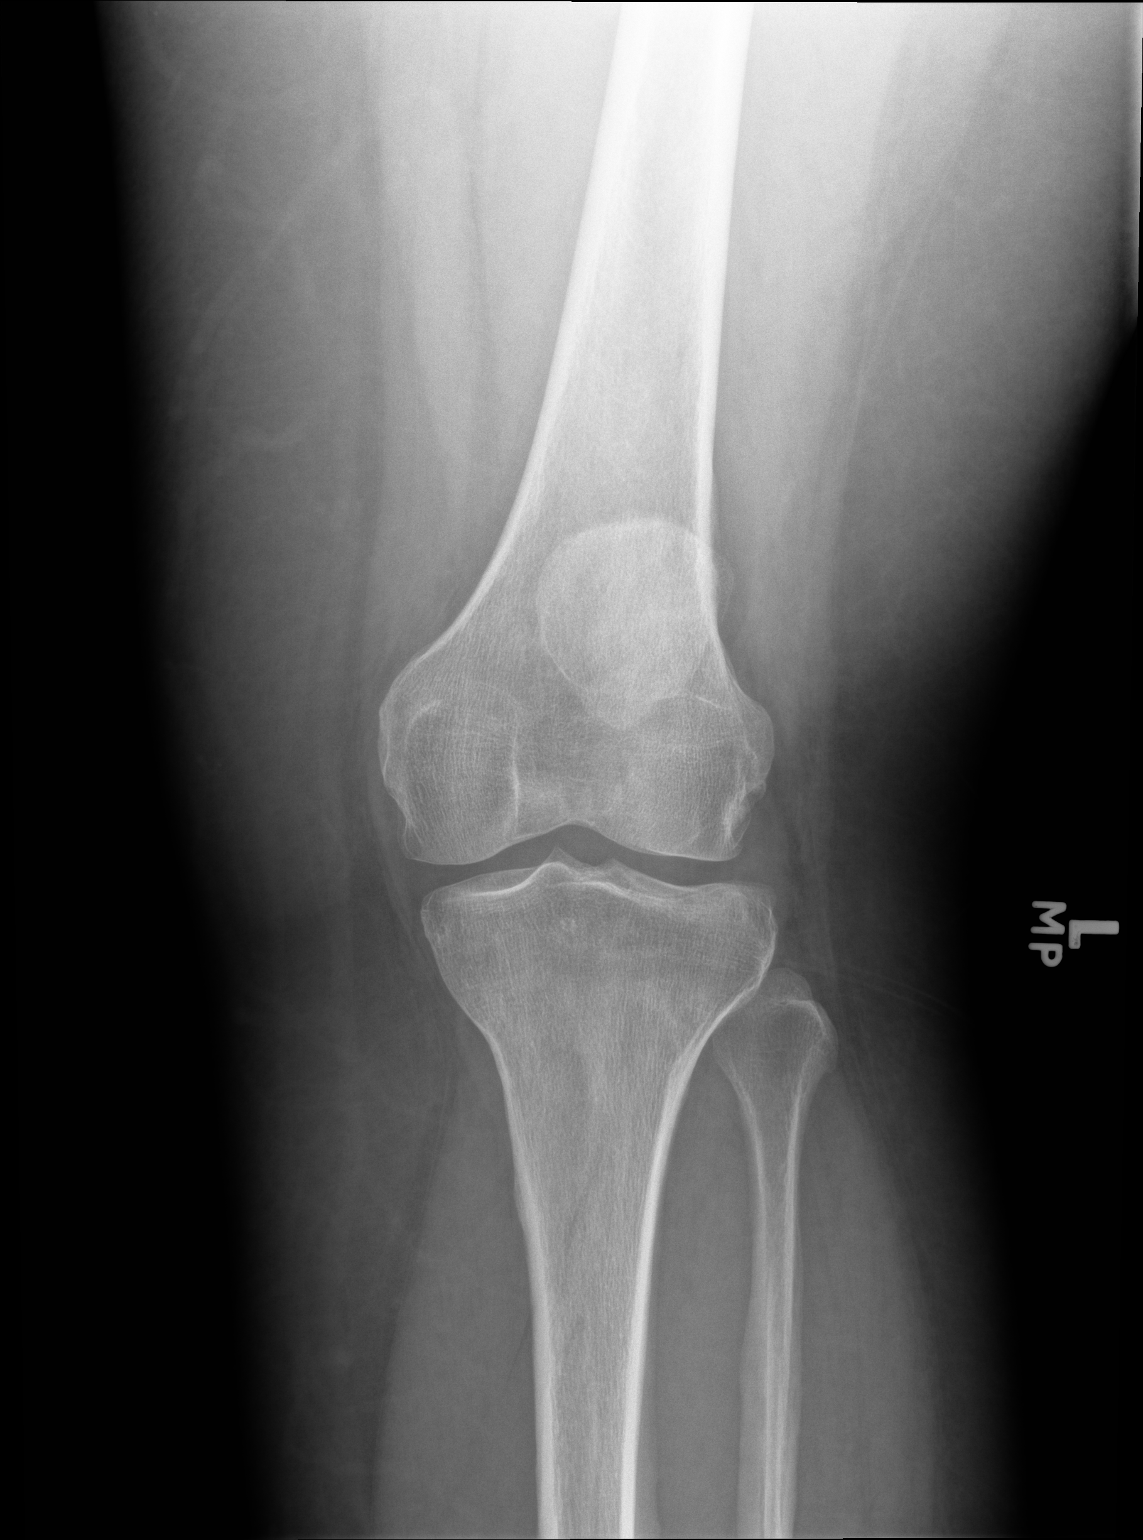

[lateral]
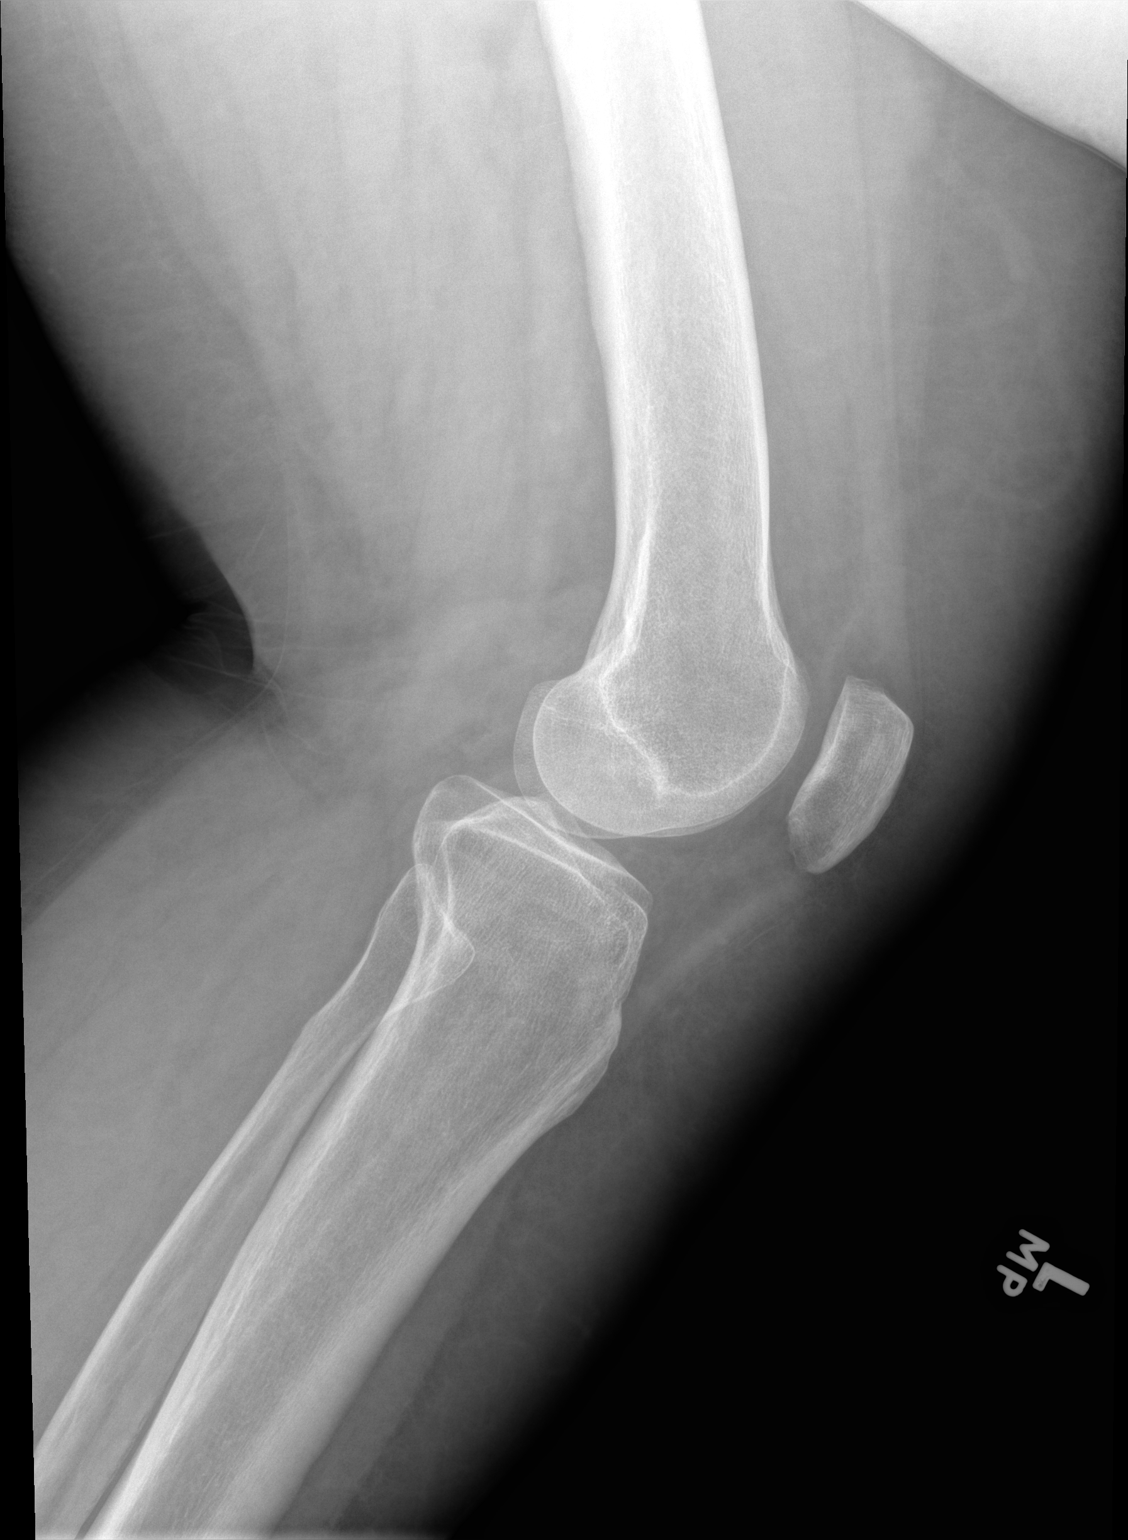

[ap int rot]
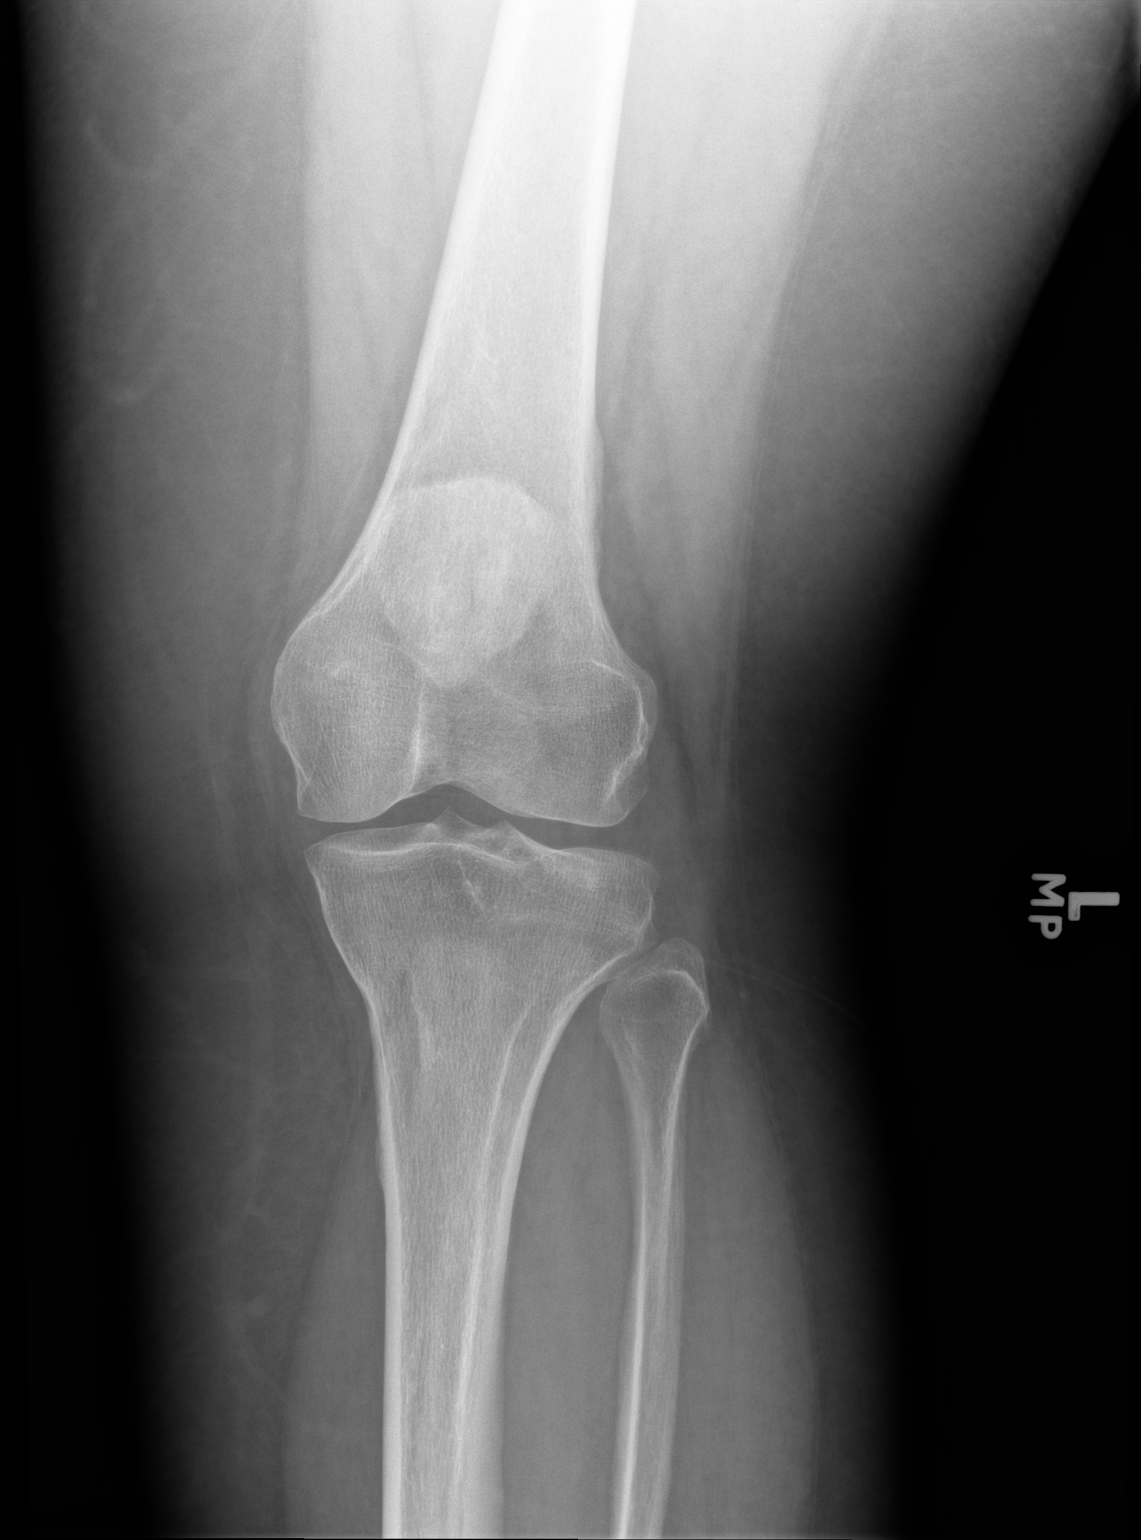

[3 of 3 positions shown; findings below may reference images not displayed]

FINDINGS: No fracture. Normal alignment. Joint spaces are preserved. No 
effusion. Normal bone mineralization.
IMPRESSION: Negative exam.  
Recommend MR exam.
